# Patient Record
Sex: Female | Born: 1970 | Race: White | Hispanic: No | Smoking: Never smoker
Health system: Southern US, Community
[De-identification: ages and names within clinical notes are randomized; demographics above are authoritative.]

## PROBLEM LIST (undated history)

## (undated) DIAGNOSIS — Z87442 Personal history of urinary calculi: Secondary | ICD-10-CM

## (undated) DIAGNOSIS — I471 Supraventricular tachycardia, unspecified: Secondary | ICD-10-CM

## (undated) DIAGNOSIS — Z9889 Other specified postprocedural states: Secondary | ICD-10-CM

## (undated) DIAGNOSIS — B069 Rubella without complication: Secondary | ICD-10-CM

## (undated) DIAGNOSIS — J45909 Unspecified asthma, uncomplicated: Secondary | ICD-10-CM

## (undated) DIAGNOSIS — R112 Nausea with vomiting, unspecified: Secondary | ICD-10-CM

## (undated) HISTORY — PX: OTHER SURGICAL HISTORY: SHX169

## (undated) HISTORY — DX: Rubella without complication: B06.9

## (undated) HISTORY — PX: WISDOM TOOTH EXTRACTION: SHX21

## (undated) HISTORY — PX: TONSILLECTOMY: SUR1361

## (undated) HISTORY — PX: DIAGNOSTIC LAPAROSCOPY: SUR761

## (undated) HISTORY — PX: VAGINAL HYSTERECTOMY: SUR661

---

## 1999-02-21 ENCOUNTER — Other Ambulatory Visit: Admission: RE | Admit: 1999-02-21 | Discharge: 1999-02-21 | Payer: Self-pay | Admitting: Obstetrics and Gynecology

## 1999-03-12 ENCOUNTER — Encounter (INDEPENDENT_AMBULATORY_CARE_PROVIDER_SITE_OTHER): Payer: Self-pay

## 1999-03-13 ENCOUNTER — Inpatient Hospital Stay (HOSPITAL_COMMUNITY): Admission: EM | Admit: 1999-03-13 | Discharge: 1999-03-14 | Payer: Self-pay | Admitting: Obstetrics and Gynecology

## 1999-03-27 ENCOUNTER — Inpatient Hospital Stay (HOSPITAL_COMMUNITY): Admission: AD | Admit: 1999-03-27 | Discharge: 1999-03-27 | Payer: Self-pay | Admitting: Obstetrics & Gynecology

## 2002-02-01 ENCOUNTER — Encounter: Payer: Self-pay | Admitting: Family Medicine

## 2002-02-01 ENCOUNTER — Encounter: Admission: RE | Admit: 2002-02-01 | Discharge: 2002-02-01 | Payer: Self-pay | Admitting: Family Medicine

## 2002-05-03 ENCOUNTER — Encounter: Admission: RE | Admit: 2002-05-03 | Discharge: 2002-05-03 | Payer: Self-pay | Admitting: Family Medicine

## 2002-05-03 ENCOUNTER — Encounter: Payer: Self-pay | Admitting: Family Medicine

## 2002-05-18 ENCOUNTER — Encounter: Admission: RE | Admit: 2002-05-18 | Discharge: 2002-05-18 | Payer: Self-pay | Admitting: Family Medicine

## 2002-05-18 ENCOUNTER — Encounter: Payer: Self-pay | Admitting: Family Medicine

## 2002-06-10 ENCOUNTER — Encounter: Payer: Self-pay | Admitting: Family Medicine

## 2002-06-10 ENCOUNTER — Encounter: Admission: RE | Admit: 2002-06-10 | Discharge: 2002-06-10 | Payer: Self-pay | Admitting: Family Medicine

## 2003-02-04 ENCOUNTER — Encounter: Payer: Self-pay | Admitting: Obstetrics and Gynecology

## 2003-02-04 ENCOUNTER — Inpatient Hospital Stay (HOSPITAL_COMMUNITY): Admission: AD | Admit: 2003-02-04 | Discharge: 2003-02-04 | Payer: Self-pay | Admitting: Obstetrics and Gynecology

## 2003-09-15 ENCOUNTER — Other Ambulatory Visit: Admission: RE | Admit: 2003-09-15 | Discharge: 2003-09-15 | Payer: Self-pay | Admitting: Obstetrics and Gynecology

## 2004-03-14 ENCOUNTER — Ambulatory Visit: Payer: Self-pay | Admitting: Family Medicine

## 2004-03-19 ENCOUNTER — Ambulatory Visit: Payer: Self-pay | Admitting: Family Medicine

## 2004-04-13 ENCOUNTER — Emergency Department: Payer: Self-pay | Admitting: Emergency Medicine

## 2004-11-02 ENCOUNTER — Ambulatory Visit: Payer: Self-pay | Admitting: Family Medicine

## 2004-12-14 ENCOUNTER — Emergency Department: Payer: Self-pay | Admitting: Emergency Medicine

## 2004-12-14 ENCOUNTER — Other Ambulatory Visit: Payer: Self-pay

## 2005-05-20 ENCOUNTER — Other Ambulatory Visit: Admission: RE | Admit: 2005-05-20 | Discharge: 2005-05-20 | Payer: Self-pay | Admitting: Obstetrics and Gynecology

## 2005-09-21 ENCOUNTER — Inpatient Hospital Stay (HOSPITAL_COMMUNITY): Admission: AD | Admit: 2005-09-21 | Discharge: 2005-09-21 | Payer: Self-pay | Admitting: Obstetrics & Gynecology

## 2005-10-18 ENCOUNTER — Ambulatory Visit: Payer: Self-pay | Admitting: Family Medicine

## 2006-01-15 ENCOUNTER — Ambulatory Visit: Payer: Self-pay | Admitting: Family Medicine

## 2006-02-15 ENCOUNTER — Ambulatory Visit: Payer: Self-pay | Admitting: Orthopedic Surgery

## 2006-03-20 ENCOUNTER — Emergency Department: Payer: Self-pay | Admitting: Emergency Medicine

## 2006-04-19 ENCOUNTER — Emergency Department (HOSPITAL_COMMUNITY): Admission: EM | Admit: 2006-04-19 | Discharge: 2006-04-19 | Payer: Self-pay | Admitting: Emergency Medicine

## 2006-09-20 ENCOUNTER — Emergency Department: Payer: Self-pay | Admitting: Internal Medicine

## 2006-10-09 ENCOUNTER — Ambulatory Visit: Payer: Self-pay

## 2007-01-09 ENCOUNTER — Ambulatory Visit: Payer: Self-pay | Admitting: Family Medicine

## 2007-01-09 DIAGNOSIS — J019 Acute sinusitis, unspecified: Secondary | ICD-10-CM | POA: Insufficient documentation

## 2007-04-02 ENCOUNTER — Emergency Department: Payer: Self-pay | Admitting: Emergency Medicine

## 2007-04-02 ENCOUNTER — Other Ambulatory Visit: Payer: Self-pay

## 2007-05-21 ENCOUNTER — Ambulatory Visit: Payer: Self-pay | Admitting: Family Medicine

## 2007-05-21 DIAGNOSIS — F319 Bipolar disorder, unspecified: Secondary | ICD-10-CM | POA: Insufficient documentation

## 2007-09-07 ENCOUNTER — Ambulatory Visit: Payer: Self-pay | Admitting: Otolaryngology

## 2007-09-28 ENCOUNTER — Emergency Department: Payer: Self-pay | Admitting: Emergency Medicine

## 2007-09-29 ENCOUNTER — Emergency Department: Payer: Self-pay | Admitting: Emergency Medicine

## 2007-10-01 ENCOUNTER — Ambulatory Visit: Payer: Self-pay | Admitting: Emergency Medicine

## 2007-11-12 ENCOUNTER — Ambulatory Visit: Payer: Self-pay | Admitting: General Surgery

## 2008-02-29 ENCOUNTER — Emergency Department: Payer: Self-pay | Admitting: Emergency Medicine

## 2008-05-02 ENCOUNTER — Emergency Department: Payer: Self-pay | Admitting: Emergency Medicine

## 2008-09-02 ENCOUNTER — Emergency Department: Payer: Self-pay | Admitting: Emergency Medicine

## 2008-09-06 ENCOUNTER — Emergency Department: Payer: Self-pay | Admitting: Emergency Medicine

## 2008-10-06 ENCOUNTER — Other Ambulatory Visit: Payer: Self-pay | Admitting: Specialist

## 2008-11-11 ENCOUNTER — Other Ambulatory Visit: Payer: Self-pay

## 2009-02-06 ENCOUNTER — Ambulatory Visit: Payer: Self-pay | Admitting: Neurosurgery

## 2009-11-27 ENCOUNTER — Encounter (INDEPENDENT_AMBULATORY_CARE_PROVIDER_SITE_OTHER): Payer: Self-pay | Admitting: *Deleted

## 2010-03-28 ENCOUNTER — Ambulatory Visit (HOSPITAL_COMMUNITY)
Admission: RE | Admit: 2010-03-28 | Discharge: 2010-03-28 | Payer: Self-pay | Source: Home / Self Care | Attending: Obstetrics and Gynecology | Admitting: Obstetrics and Gynecology

## 2010-05-24 NOTE — Assessment & Plan Note (Signed)
Summary: SINUS INFECTION???/[facility]   Vital Signs:  Patient Profile:   40 Years Old Female Weight:      134 pounds Temp:     98.7 degrees F oral Pulse rate:   97 / minute BP sitting:   163 / 83  (left arm) Cuff size:   regular  Vitals Entered By: Cooper Render (January 09, 2007 4:16 PM)                 Chief Complaint:  uri sx's, took guaifenex, and no help.  Acute Visit History:      The patient complains of cough, earache, headache, sinus problems, and sore throat.  These symptoms began 2 weeks ago.  She denies fever.  Other comments include: using guafenesin.        The character of the cough is described as nonproductive.  There is no history of wheezing or shortness of breath associated with her cough.        The earache is located on the left side.        She complains of sinus pressure, ears being blocked, nasal congestion, and frontal headache.  She denies a previous history of sinusitis or previous sinus surgery.         Current Allergies (reviewed today): ! SULFA ! * LATEX     Review of Systems      See HPI   Physical Exam  General:     Well-developed,well-nourished,in no acute distress; alert,appropriate and cooperative throughout examination Head:     ttp b max sinuses Eyes:     No corneal or conjunctival inflammation noted. EOMI. Perrla. Vision grossly normal. Ears:     External ear exam shows no significant lesions or deformities.  Otoscopic examination reveals clear canals, tympanic membranes are intact bilaterally without bulging, retraction, inflammation or discharge. Hearing is grossly normal bilaterally. Nose:     nasal dischargemucosal pallor.   Mouth:     MMM Lungs:     Normal respiratory effort, chest expands symmetrically. Lungs are clear to auscultation, no crackles or wheezes. Heart:     Normal rate and regular rhythm. S1 and S2 normal without gallop, murmur, click, rub or other extra sounds.    Impression &  Recommendations:  Problem # 1:  SINUSITIS- ACUTE-NOS (ICD-461.9) Symptomatic care. Her updated medication list for this problem includes:    Amoxicillin 500 Mg Caps (Amoxicillin) .Marland Kitchen... 2 tab by mouth two times a day x 10 days   Complete Medication List: 1)  Amoxicillin 500 Mg Caps (Amoxicillin) .... 2 tab by mouth two times a day x 10 days   Patient Instructions: 1)  Mucinex daily, nasal saline three times a day     Prescriptions: AMOXICILLIN 500 MG  CAPS (AMOXICILLIN) 2 tab by mouth two times a day x 10 days  #40 x 0   Entered and Authorized by:   Kerby Nora MD   Signed by:   Kerby Nora MD on 01/09/2007   Method used:   Print then Give to Patient   RxID:   [id number]  ]

## 2010-05-24 NOTE — Letter (Signed)
Summary: Nadara Eaton letter  [place] at [facility]  [address] [place], [place] [address]   Phone: [phone]  Fax: [phone]       11/27/2009 MRN: [id number]  ADEENA BERNABE [address] [place], [place]  [address]  Dear Ms. [facility] and [facility] announce the retirement of Arta Silence, M.D., from full-time practice at the [facility] office effective October 19, 2009 and his plans of returning part-time.  It is important to Dr. Hetty Ely and to our practice that you understand that [facility] has seven physicians in our office for your health care needs.  We will continue to offer the same exceptional care that you have today.    Dr. Hetty Ely has spoken to many of you about his plans for retirement and returning part-time in the fall.   We will continue to work with you through the transition to schedule appointments for you in the office and meet the high standards that [facility] is committed to.   Again, it is with great pleasure that we share the news that Dr. Hetty Ely will return to [facility] in October of 2011 with a reduced schedule.    If you have any questions, or would like to request an appointment with one of our physicians, please call us at [phone] and press the option for Scheduling an appointment.  We take pleasure in providing you with excellent patient care and look forward to seeing you at your next office visit.  Our [facility] Physicians are:  Tillman Abide, M.D. Laurita Quint, M.D. Roxy Manns, M.D. Kerby Nora, M.D. Hannah Beat, M.D. Ruthe Mannan, M.D. We proudly welcomed Raechel Ache, M.D. and Eustaquio Boyden, M.D. to the practice in July/August 2011.  Sincerely,  [facility]

## 2010-06-01 ENCOUNTER — Emergency Department: Payer: Self-pay | Admitting: Emergency Medicine

## 2010-07-02 LAB — CBC
HCT: 44.3 % (ref 36.0–46.0)
Hemoglobin: 15 g/dL (ref 12.0–15.0)
MCH: 30.3 pg (ref 26.0–34.0)
MCHC: 33.8 g/dL (ref 30.0–36.0)
MCV: 89.7 fL (ref 78.0–100.0)
Platelets: 221 10*3/uL (ref 150–400)
RBC: 4.94 MIL/uL (ref 3.87–5.11)
RDW: 12.2 % (ref 11.5–15.5)
WBC: 12.2 10*3/uL — ABNORMAL HIGH (ref 4.0–10.5)

## 2010-09-07 NOTE — Consult Note (Signed)
[name], [name] NO.:  [id number]   MEDICAL RECORD NO.:  [id number]          PATIENT TYPE:  EMS   LOCATION:  [facility]                         FACILITY:  [facility]   PHYSICIAN:  Dionne Ano. Gramig III, M.D.DATE OF BIRTH:  Jul 29, 1970   DATE OF CONSULTATION:  DATE OF DISCHARGE:  04/19/2006                                 CONSULTATION   Tasha Miller was seen by the emergency room staff in the emergency  department.  She was brought to the emergency room due to pain after  surgical intervention for her upper extremity.  The patient underwent  ECRB debridement by myself about the right elbow Thursday afternoon.  She presents to the emergency room Saturday morning for pain.  She was  given 15 mcg of fentanyl x2 doses by the ER department.  I was called in  regards to her status and I came down to the emergency room to see her.   Tasha Miller is alert and oriented, she is with her friend.  She notes pain  with palpation over the area of surgical intervention.  I have gone  ahead and removed her splint and examined her.  I should note she is  neurovascularly in intact, there is no signs of compartment syndrome,  dystrophy or infection, skin is intact, compartments are soft, she has  excellent pulse, normal radial, median and ulnar nerve function, the  incision is clean, dry and intact.  I have reviewed this with Tasha Miller in  length; I have replaced her splint with careful alcohol wiped down to  the skin followed by placement of AAA antibiotic cream over the skin  followed by placement of gauze and a posterior plaster splint.  The  patient tolerated this well and there were no complicating features.  Following this, I discussed with her Dilaudid use 2 mg one p.o. q.4-6.h.  She can go up to two tablets of 2 mg every four to six hours as  necessary.  I have given her a prescription for Phenergan and in  addition to this, I gave her instructions to continue Robaxin for spasm.  The patient  will continue ice, elevation, finger range of motion, etc.  At the present time, she feels comfortable leaving the ER and monitoring  her condition at home.  I would concur of course.  I will plan to see  her back in the office in the next week or so and ask her to notify me  should any problems, questions or concerns arise.  All questions have  been encouraged, answered and addressed.           ______________________________  Dionne Ano. Everlene Other, M.D.     Tasha Miller  D:  04/19/2006  T:  04/20/2006  Job:  [id number]

## 2011-05-01 ENCOUNTER — Ambulatory Visit: Payer: Self-pay | Admitting: General Practice

## 2011-07-10 ENCOUNTER — Emergency Department: Payer: Self-pay | Admitting: Emergency Medicine

## 2011-07-10 LAB — CBC
HCT: 40.8 % (ref 35.0–47.0)
HGB: 13.8 g/dL (ref 12.0–16.0)
MCH: 29.6 pg (ref 26.0–34.0)
MCHC: 33.8 g/dL (ref 32.0–36.0)
MCV: 88 fL (ref 80–100)
Platelet: 205 10*3/uL (ref 150–440)
RBC: 4.66 10*6/uL (ref 3.80–5.20)
RDW: 12.6 % (ref 11.5–14.5)
WBC: 9.9 10*3/uL (ref 3.6–11.0)

## 2011-07-10 LAB — COMPREHENSIVE METABOLIC PANEL
Albumin: 4 g/dL (ref 3.4–5.0)
Alkaline Phosphatase: 61 U/L (ref 50–136)
Anion Gap: 12 (ref 7–16)
BUN: 10 mg/dL (ref 7–18)
Bilirubin,Total: 0.5 mg/dL (ref 0.2–1.0)
Calcium, Total: 8.7 mg/dL (ref 8.5–10.1)
Chloride: 108 mmol/L — ABNORMAL HIGH (ref 98–107)
Co2: 24 mmol/L (ref 21–32)
Creatinine: 0.87 mg/dL (ref 0.60–1.30)
EGFR (African American): >60
EGFR (Non-African Amer.): >60
Glucose: 142 mg/dL — ABNORMAL HIGH (ref 65–99)
Osmolality: 288 (ref 275–301)
Potassium: 3.6 mmol/L (ref 3.5–5.1)
SGOT(AST): 23 U/L (ref 15–37)
SGPT (ALT): 24 U/L
Sodium: 144 mmol/L (ref 136–145)
Total Protein: 7.4 g/dL (ref 6.4–8.2)

## 2011-07-10 LAB — URINALYSIS, COMPLETE
Bilirubin,UR: NEGATIVE
Glucose,UR: NEGATIVE mg/dL (ref 0–75)
Ketone: NEGATIVE
Leukocyte Esterase: NEGATIVE
Nitrite: NEGATIVE
Ph: 5 (ref 4.5–8.0)
Protein: NEGATIVE
RBC,UR: 2 /HPF (ref 0–5)
Specific Gravity: 1.016 (ref 1.003–1.030)
Squamous Epithelial: 7
WBC UR: 5 /HPF (ref 0–5)

## 2011-07-10 LAB — PREGNANCY, URINE: Pregnancy Test, Urine: NEGATIVE m[IU]/mL

## 2011-07-10 LAB — LIPASE, BLOOD: Lipase: 172 U/L (ref 73–393)

## 2011-12-03 ENCOUNTER — Ambulatory Visit: Payer: Self-pay

## 2012-03-23 ENCOUNTER — Other Ambulatory Visit: Payer: Self-pay | Admitting: Obstetrics and Gynecology

## 2012-08-20 ENCOUNTER — Ambulatory Visit: Payer: Self-pay | Admitting: General Practice

## 2012-12-15 ENCOUNTER — Ambulatory Visit: Payer: Self-pay | Admitting: General Practice

## 2013-01-20 ENCOUNTER — Other Ambulatory Visit: Payer: Self-pay | Admitting: Obstetrics and Gynecology

## 2013-07-13 ENCOUNTER — Ambulatory Visit (INDEPENDENT_AMBULATORY_CARE_PROVIDER_SITE_OTHER): Payer: No Typology Code available for payment source | Admitting: Internal Medicine

## 2013-07-13 ENCOUNTER — Encounter: Payer: Self-pay | Admitting: Internal Medicine

## 2013-07-13 VITALS — BP 100/64 | HR 72 | Temp 98.4°F | Ht 64.0 in | Wt 132.5 lb

## 2013-07-13 DIAGNOSIS — R5381 Other malaise: Secondary | ICD-10-CM

## 2013-07-13 DIAGNOSIS — Z0000 Encounter for general adult medical examination without abnormal findings: Secondary | ICD-10-CM

## 2013-07-13 DIAGNOSIS — E781 Pure hyperglyceridemia: Secondary | ICD-10-CM

## 2013-07-13 DIAGNOSIS — R5383 Other fatigue: Principal | ICD-10-CM

## 2013-07-13 LAB — LIPID PANEL
Cholesterol: 192 mg/dL (ref 0–200)
HDL: 45.2 mg/dL (ref >39.00)
LDL Cholesterol: 123 mg/dL — ABNORMAL HIGH (ref 0–99)
Total CHOL/HDL Ratio: 4
Triglycerides: 118 mg/dL (ref 0.0–149.0)
VLDL: 23.6 mg/dL (ref 0.0–40.0)

## 2013-07-13 LAB — CBC
HCT: 43.2 % (ref 36.0–46.0)
Hemoglobin: 14.3 g/dL (ref 12.0–15.0)
MCHC: 33.1 g/dL (ref 30.0–36.0)
MCV: 87.4 fl (ref 78.0–100.0)
Platelets: 219 10*3/uL (ref 150.0–400.0)
RBC: 4.95 Mil/uL (ref 3.87–5.11)
RDW: 12.9 % (ref 11.5–14.6)
WBC: 6.6 10*3/uL (ref 4.5–10.5)

## 2013-07-13 LAB — COMPREHENSIVE METABOLIC PANEL
ALT: 23 U/L (ref 0–35)
AST: 21 U/L (ref 0–37)
Albumin: 4.7 g/dL (ref 3.5–5.2)
Alkaline Phosphatase: 50 U/L (ref 39–117)
BUN: 15 mg/dL (ref 6–23)
CO2: 29 mEq/L (ref 19–32)
Calcium: 9.8 mg/dL (ref 8.4–10.5)
Chloride: 107 mEq/L (ref 96–112)
Creatinine, Ser: 0.9 mg/dL (ref 0.4–1.2)
GFR: 77.29 mL/min (ref >60.00)
Glucose, Bld: 100 mg/dL — ABNORMAL HIGH (ref 70–99)
Potassium: 4.8 mEq/L (ref 3.5–5.1)
Sodium: 142 mEq/L (ref 135–145)
Total Bilirubin: 0.7 mg/dL (ref 0.3–1.2)
Total Protein: 7.3 g/dL (ref 6.0–8.3)

## 2013-07-13 LAB — TSH: TSH: 1.4 u[IU]/mL (ref 0.35–5.50)

## 2013-07-13 LAB — VITAMIN B12: Vitamin B-12: 446 pg/mL (ref 211–911)

## 2013-07-13 NOTE — Progress Notes (Signed)
HPI Pt presents to the clinic today to establish care. She has not had a PCP in the last 4 years. She does have some concerns today.  1- She is fatigue all the time. She is not sure if her vitamin levels are low. She sleeps well at night. She would like her vitamin levels checked. 2- she has c/o intermittent pain in the left flank radiating to the left abdomen. She does have some associated nausea with this. She denies urinary symptoms. She has had kidney stones in the past.  Flu: 2014 LMP: hysterectomy Pap Smear: 2014 Eye Doctor: yearly Dentist: biannually   History reviewed. No pertinent past medical history.  Current Outpatient Prescriptions  Medication Sig Dispense Refill  . cholecalciferol (VITAMIN D) 1000 UNITS tablet Take 1,000 Units by mouth daily.      . Multiple Vitamin (MULTIVITAMIN) tablet Take 1 tablet by mouth 2 (two) times daily.      . NON FORMULARY Cell Enhancer, take by mouth two times a day      . Vitamin D, Ergocalciferol, (DRISDOL) 50000 UNITS CAPS capsule Take 50,000 Units by mouth every 7 (seven) days.       No current facility-administered medications for this visit.    Allergies  Allergen Reactions  . Latex     REACTION: rash  . Sulfonamide Derivatives     REACTION: projectile vomiting    Family History  Problem Relation Age of Onset  . Cancer Maternal Aunt     thyroid cancer  . Cancer Maternal Uncle   . Diabetes Paternal Aunt   . Heart disease Neg Hx   . Stroke Neg Hx     History   Social History  . Marital Status: Single    Spouse Name: N/A    Number of Children: N/A  . Years of Education: N/A   Occupational History  . Not on file.   Social History Main Topics  . Smoking status: Never Smoker   . Smokeless tobacco: Never Used  . Alcohol Use: No  . Drug Use: No  . Sexual Activity: Not on file   Other Topics Concern  . Not on file   Social History Narrative  . No narrative on file    ROS:  Constitutional: Pt reports fatigue.  Denies fever, malaise,  headache or abrupt weight changes.  HEENT: Denies eye pain, eye redness, ear pain, ringing in the ears, wax buildup, runny nose, nasal congestion, bloody nose, or sore throat. Respiratory: Denies difficulty breathing, shortness of breath, cough or sputum production.   Cardiovascular: Denies chest pain, chest tightness, palpitations or swelling in the hands or feet.  Gastrointestinal: Pt reports LLQ abdominal pain. Denies bloating, constipation, diarrhea or blood in the stool.  GU: pt reports left flank pain. Denies frequency, urgency, pain with urination, blood in urine, odor or discharge. Musculoskeletal: Denies decrease in range of motion, difficulty with gait, muscle pain or joint pain and swelling.  Skin: Denies redness, rashes, lesions or ulcercations.  Neurological: Denies dizziness, difficulty with memory, difficulty with speech or problems with balance and coordination.   No other specific complaints in a complete review of systems (except as listed in HPI above).  PE:  BP 100/64  Pulse 72  Temp(Src) 98.4 F (36.9 C) (Oral)  Ht 5\' 4"  (1.626 m)  Wt 132 lb 8 oz (60.102 kg)  BMI 22.73 kg/m2 Wt Readings from Last 3 Encounters:  07/13/13 132 lb 8 oz (60.102 kg)  05/21/07 135 lb (61.236 kg)  01/09/07  134 lb (60.782 kg)    General: Appears her stated age, well developed, well nourished in NAD. HEENT: Head: normal shape and size; Eyes: sclera white, no icterus, conjunctiva pink, PERRLA and EOMs intact; Ears: Tm's gray and intact, normal light reflex; Nose: mucosa pink and moist, septum midline; Throat/Mouth: Teeth present, mucosa pink and moist, no lesions or ulcerations noted.  Neck: Normal range of motion. Neck supple, trachea midline. No massses, lumps present. thyromegaly noted.  Cardiovascular: Normal rate and rhythm. S1,S2 noted.  No murmur, rubs or gallops noted. No JVD or BLE edema. No carotid bruits noted. Pulmonary/Chest: Normal effort and positive  vesicular breath sounds. No respiratory distress. No wheezes, rales or ronchi noted.  Abdomen: Soft and nontender. Normal bowel sounds, no bruits noted. No distention or masses noted. Liver, spleen and kidneys non palpable. No CVA tenderness noted. Musculoskeletal: Normal range of motion. No signs of joint swelling. No difficulty with gait.  Neurological: Alert and oriented. Cranial nerves II-XII intact. Coordination normal. +DTRs bilaterally. Psychiatric: Mood and affect normal. Behavior is normal. Judgment and thought content normal.   Assejscript:void(0)ssment and Plan:  Left flank pain and left lower abdominal pain:  She is asymptomatic at this time She is having normal BM If continue to be an issue, will consider CT scan of abdomen  Fatigue:  Will check CBC and B12 today  Prevent Health Maintenance:  Will check basic screening labs including lipid profile  RTC in 1 year or sooner if needed

## 2013-07-13 NOTE — Patient Instructions (Addendum)
Hypertriglyceridemia  Diet for High blood levels of Triglycerides Most fats in food are triglycerides. Triglycerides in your blood are stored as fat in your body. High levels of triglycerides in your blood may put you at a greater risk for heart disease and stroke.  Normal triglyceride levels are less than 150 mg/dL. Borderline high levels are 150-199 mg/dl. High levels are 200 - 499 mg/dL, and very high triglyceride levels are greater than 500 mg/dL. The decision to treat high triglycerides is generally based on the level. For people with borderline or high triglyceride levels, treatment includes weight loss and exercise. Drugs are recommended for people with very high triglyceride levels. Many people who need treatment for high triglyceride levels have metabolic syndrome. This syndrome is a collection of disorders that often include: insulin resistance, high blood pressure, blood clotting problems, high cholesterol and triglycerides. TESTING PROCEDURE FOR TRIGLYCERIDES  You should not eat 4 hours before getting your triglycerides measured. The normal range of triglycerides is between 10 and 250 milligrams per deciliter (mg/dl). Some people may have extreme levels (1000 or above), but your triglyceride level may be too high if it is above 150 mg/dl, depending on what other risk factors you have for heart disease.  People with high blood triglycerides may also have high blood cholesterol levels. If you have high blood cholesterol as well as high blood triglycerides, your risk for heart disease is probably greater than if you only had high triglycerides. High blood cholesterol is one of the main risk factors for heart disease. CHANGING YOUR DIET  Your weight can affect your blood triglyceride level. If you are more than 20% above your ideal body weight, you may be able to lower your blood triglycerides by losing weight. Eating less and exercising regularly is the best way to combat this. Fat provides more  calories than any other food. The best way to lose weight is to eat less fat. Only 30% of your total calories should come from fat. Less than 7% of your diet should come from saturated fat. A diet low in fat and saturated fat is the same as a diet to decrease blood cholesterol. By eating a diet lower in fat, you may lose weight, lower your blood cholesterol, and lower your blood triglyceride level.  Eating a diet low in fat, especially saturated fat, may also help you lower your blood triglyceride level. Ask your dietitian to help you figure how much fat you can eat based on the number of calories your caregiver has prescribed for you.  Exercise, in addition to helping with weight loss may also help lower triglyceride levels.   Alcohol can increase blood triglycerides. You may need to stop drinking alcoholic beverages.  Too much carbohydrate in your diet may also increase your blood triglycerides. Some complex carbohydrates are necessary in your diet. These may include bread, rice, potatoes, other starchy vegetables and cereals.  Reduce "simple" carbohydrates. These may include pure sugars, candy, honey, and jelly without losing other nutrients. If you have the kind of high blood triglycerides that is affected by the amount of carbohydrates in your diet, you will need to eat less sugar and less high-sugar foods. Your caregiver can help you with this.  Adding 2-4 grams of fish oil (EPA+ DHA) may also help lower triglycerides. Speak with your caregiver before adding any supplements to your regimen. Following the Diet  Maintain your ideal weight. Your caregivers can help you with a diet. Generally, eating less food and getting more   exercise will help you lose weight. Joining a weight control group may also help. Ask your caregivers for a good weight control group in your area.  Eat low-fat foods instead of high-fat foods. This can help you lose weight too.  These foods are lower in fat. Eat MORE of these:    Dried beans, peas, and lentils.  Egg whites.  Low-fat cottage cheese.  Fish.  Lean cuts of meat, such as round, sirloin, rump, and flank (cut extra fat off meat you fix).  Whole grain breads, cereals and pasta.  Skim and nonfat dry milk.  Low-fat yogurt.  Poultry without the skin.  Cheese made with skim or part-skim milk, such as mozzarella, parmesan, farmers', ricotta, or pot cheese. These are higher fat foods. Eat LESS of these:   Whole milk and foods made from whole milk, such as American, blue, cheddar, monterey jack, and swiss cheese  High-fat meats, such as luncheon meats, sausages, knockwurst, bratwurst, hot dogs, ribs, corned beef, ground pork, and regular ground beef.  Fried foods. Limit saturated fats in your diet. Substituting unsaturated fat for saturated fat may decrease your blood triglyceride level. You will need to read package labels to know which products contain saturated fats.  These foods are high in saturated fat. Eat LESS of these:   Fried pork skins.  Whole milk.  Skin and fat from poultry.  Palm oil.  Butter.  Shortening.  Cream cheese.  Bacon.  Margarines and baked goods made from listed oils.  Vegetable shortenings.  Chitterlings.  Fat from meats.  Coconut oil.  Palm kernel oil.  Lard.  Cream.  Sour cream.  Fatback.  Coffee whiteners and non-dairy creamers made with these oils.  Cheese made from whole milk. Use unsaturated fats (both polyunsaturated and monounsaturated) moderately. Remember, even though unsaturated fats are better than saturated fats; you still want a diet low in total fat.  These foods are high in unsaturated fat:   Canola oil.  Sunflower oil.  Mayonnaise.  Almonds.  Peanuts.  Pine nuts.  Margarines made with these oils.  Safflower oil.  Olive oil.  Avocados.  Cashews.  Peanut butter.  Sunflower seeds.  Soybean oil.  Peanut  oil.  Olives.  Pecans.  Walnuts.  Pumpkin seeds. Avoid sugar and other high-sugar foods. This will decrease carbohydrates without decreasing other nutrients. Sugar in your food goes rapidly to your blood. When there is excess sugar in your blood, your liver may use it to make more triglycerides. Sugar also contains calories without other important nutrients.  Eat LESS of these:   Sugar, brown sugar, powdered sugar, jam, jelly, preserves, honey, syrup, molasses, pies, candy, cakes, cookies, frosting, pastries, colas, soft drinks, punches, fruit drinks, and regular gelatin.  Avoid alcohol. Alcohol, even more than sugar, may increase blood triglycerides. In addition, alcohol is high in calories and low in nutrients. Ask for sparkling water, or a diet soft drink instead of an alcoholic beverage. Suggestions for planning and preparing meals   Bake, broil, grill or roast meats instead of frying.  Remove fat from meats and skin from poultry before cooking.  Add spices, herbs, lemon juice or vinegar to vegetables instead of salt, rich sauces or gravies.  Use a non-stick skillet without fat or use no-stick sprays.  Cool and refrigerate stews and broth. Then remove the hardened fat floating on the surface before serving.  Refrigerate meat drippings and skim off fat to make low-fat gravies.  Serve more fish.  Use less butter,   margarine and other high-fat spreads on bread or vegetables.  Use skim or reconstituted non-fat dry milk for cooking.  Cook with low-fat cheeses.  Substitute low-fat yogurt or cottage cheese for all or part of the sour cream in recipes for sauces, dips or congealed salads.  Use half yogurt/half mayonnaise in salad recipes.  Substitute evaporated skim milk for cream. Evaporated skim milk or reconstituted non-fat dry milk can be whipped and substituted for whipped cream in certain recipes.  Choose fresh fruits for dessert instead of high-fat foods such as pies or  cakes. Fruits are naturally low in fat. When Dining Out   Order low-fat appetizers such as fruit or vegetable juice, pasta with vegetables or tomato sauce.  Select clear, rather than cream soups.  Ask that dressings and gravies be served on the side. Then use less of them.  Order foods that are baked, broiled, poached, steamed, stir-fried, or roasted.  Ask for margarine instead of butter, and use only a small amount.  Drink sparkling water, unsweetened tea or coffee, or diet soft drinks instead of alcohol or other sweet beverages. QUESTIONS AND ANSWERS ABOUT OTHER FATS IN THE BLOOD: SATURATED FAT, TRANS FAT, AND CHOLESTEROL What is trans fat? Trans fat is a type of fat that is formed when vegetable oil is hardened through a process called hydrogenation. This process helps makes foods more solid, gives them shape, and prolongs their shelf life. Trans fats are also called hydrogenated or partially hydrogenated oils.  What do saturated fat, trans fat, and cholesterol in foods have to do with heart disease? Saturated fat, trans fat, and cholesterol in the diet all raise the level of LDL "bad" cholesterol in the blood. The higher the LDL cholesterol, the greater the risk for coronary heart disease (CHD). Saturated fat and trans fat raise LDL similarly.  What foods contain saturated fat, trans fat, and cholesterol? High amounts of saturated fat are found in animal products, such as fatty cuts of meat, chicken skin, and full-fat dairy products like butter, whole milk, cream, and cheese, and in tropical vegetable oils such as palm, palm kernel, and coconut oil. Trans fat is found in some of the same foods as saturated fat, such as vegetable shortening, some margarines (especially hard or stick margarine), crackers, cookies, baked goods, fried foods, salad dressings, and other processed foods made with partially hydrogenated vegetable oils. Small amounts of trans fat also occur naturally in some animal  products, such as milk products, beef, and lamb. Foods high in cholesterol include liver, other organ meats, egg yolks, shrimp, and full-fat dairy products. How can I use the new food label to make heart-healthy food choices? Check the Nutrition Facts panel of the food label. Choose foods lower in saturated fat, trans fat, and cholesterol. For saturated fat and cholesterol, you can also use the Percent Daily Value (%DV): 5% DV or less is low, and 20% DV or more is high. (There is no %DV for trans fat.) Use the Nutrition Facts panel to choose foods low in saturated fat and cholesterol, and if the trans fat is not listed, read the ingredients and limit products that list shortening or hydrogenated or partially hydrogenated vegetable oil, which tend to be high in trans fat. POINTS TO REMEMBER:   Discuss your risk for heart disease with your caregivers, and take steps to reduce risk factors.  Change your diet. Choose foods that are low in saturated fat, trans fat, and cholesterol.  Add exercise to your daily routine if   it is not already being done. Participate in physical activity of moderate intensity, like brisk walking, for at least 30 minutes on most, and preferably all days of the week. No time? Break the 30 minutes into three, 10-minute segments during the day.  Stop smoking. If you do smoke, contact your caregiver to discuss ways in which they can help you quit.  Do not use street drugs.  Maintain a normal weight.  Maintain a healthy blood pressure.  Keep up with your blood work for checking the fats in your blood as directed by your caregiver. Document Released: 01/25/2004 Document Revised: 10/08/2011 Document Reviewed: 08/22/2008 [employer] Patient Information 2014 [employer].  

## 2013-07-15 ENCOUNTER — Encounter: Payer: Self-pay | Admitting: Family Medicine

## 2013-07-16 ENCOUNTER — Other Ambulatory Visit: Payer: Self-pay

## 2013-07-27 ENCOUNTER — Ambulatory Visit: Payer: Self-pay

## 2014-01-31 ENCOUNTER — Ambulatory Visit: Payer: Self-pay | Admitting: General Practice

## 2014-01-31 ENCOUNTER — Other Ambulatory Visit: Payer: Self-pay | Admitting: Obstetrics and Gynecology

## 2014-02-01 LAB — CYTOLOGY - PAP

## 2014-02-03 ENCOUNTER — Other Ambulatory Visit: Payer: Self-pay | Admitting: Obstetrics and Gynecology

## 2014-02-03 DIAGNOSIS — N644 Mastodynia: Secondary | ICD-10-CM

## 2014-02-11 ENCOUNTER — Other Ambulatory Visit: Payer: No Typology Code available for payment source

## 2014-02-16 ENCOUNTER — Ambulatory Visit
Admission: RE | Admit: 2014-02-16 | Discharge: 2014-02-16 | Disposition: A | Discharge status: Home / Self Care | Payer: No Typology Code available for payment source | Source: Ambulatory Visit | Attending: Obstetrics and Gynecology | Admitting: Obstetrics and Gynecology

## 2014-02-16 DIAGNOSIS — N644 Mastodynia: Secondary | ICD-10-CM

## 2014-09-15 ENCOUNTER — Other Ambulatory Visit: Payer: Self-pay | Admitting: Family Medicine

## 2014-09-15 ENCOUNTER — Ambulatory Visit
Admission: RE | Admit: 2014-09-15 | Discharge: 2014-09-15 | Disposition: A | Discharge status: Home / Self Care | Payer: PRIVATE HEALTH INSURANCE | Source: Ambulatory Visit | Attending: Physician Assistant | Admitting: Physician Assistant

## 2014-09-15 ENCOUNTER — Other Ambulatory Visit: Payer: Self-pay | Admitting: Physician Assistant

## 2014-09-15 DIAGNOSIS — R05 Cough: Secondary | ICD-10-CM | POA: Diagnosis present

## 2014-09-15 DIAGNOSIS — R059 Cough, unspecified: Secondary | ICD-10-CM

## 2015-05-23 ENCOUNTER — Encounter (HOSPITAL_COMMUNITY): Payer: Self-pay

## 2015-05-23 NOTE — Anesthesia Preprocedure Evaluation (Addendum)
Anesthesia Evaluation  Patient identified by MRN, date of birth, ID band Patient awake    Reviewed: Allergy & Precautions, H&P , NPO status , Patient's Chart, lab work & pertinent test results  History of Anesthesia Complications (+) PONV and history of anesthetic complications  Airway Mallampati: I  TM Distance: >3 FB Neck ROM: full    Dental no notable dental hx.    Pulmonary neg pulmonary ROS,    Pulmonary exam normal breath sounds clear to auscultation       Cardiovascular negative cardio ROS Normal cardiovascular exam Rhythm:regular Rate:Normal     Neuro/Psych negative neurological ROS     GI/Hepatic negative GI ROS, Neg liver ROS,   Endo/Other  negative endocrine ROS  Renal/GU negative Renal ROS     Musculoskeletal   Abdominal   Peds  Hematology negative hematology ROS (+)   Anesthesia Other Findings   Reproductive/Obstetrics negative OB ROS                            Anesthesia Physical Anesthesia Plan  ASA: II  Anesthesia Plan: General   Post-op Pain Management:    Induction: Intravenous  Airway Management Planned: LMA  Additional Equipment:   Intra-op Plan:   Post-operative Plan: Extubation in OR  Informed Consent: I have reviewed the patients History and Physical, chart, labs and discussed the procedure including the risks, benefits and alternatives for the proposed anesthesia with the patient or authorized representative who has indicated his/her understanding and acceptance.   Dental Advisory Given  Plan Discussed with: Anesthesiologist, CRNA and Surgeon  Anesthesia Plan Comments: (GA with LMA  PONV: scop patch, decadron, zofran, IV hydration and propofol based anesthetic, limit volatile and N2O)       Anesthesia Quick Evaluation

## 2015-05-24 ENCOUNTER — Encounter (HOSPITAL_COMMUNITY)
Admission: RE | Disposition: A | Discharge status: Home / Self Care | Payer: Self-pay | Source: Ambulatory Visit | Attending: Obstetrics and Gynecology

## 2015-05-24 ENCOUNTER — Ambulatory Visit (HOSPITAL_COMMUNITY): Payer: Managed Care, Other (non HMO) | Admitting: Anesthesiology

## 2015-05-24 ENCOUNTER — Encounter (HOSPITAL_COMMUNITY): Payer: Self-pay

## 2015-05-24 ENCOUNTER — Ambulatory Visit (HOSPITAL_COMMUNITY)
Admission: RE | Admit: 2015-05-24 | Discharge: 2015-05-24 | Disposition: A | Discharge status: Home / Self Care | Payer: Managed Care, Other (non HMO) | Source: Ambulatory Visit | Attending: Obstetrics and Gynecology | Admitting: Obstetrics and Gynecology

## 2015-05-24 DIAGNOSIS — I471 Supraventricular tachycardia: Secondary | ICD-10-CM | POA: Insufficient documentation

## 2015-05-24 DIAGNOSIS — A630 Anogenital (venereal) warts: Secondary | ICD-10-CM | POA: Insufficient documentation

## 2015-05-24 HISTORY — PX: VULVAR LESION REMOVAL: SHX5391

## 2015-05-24 HISTORY — DX: Nausea with vomiting, unspecified: R11.2

## 2015-05-24 HISTORY — DX: Supraventricular tachycardia: I47.1

## 2015-05-24 HISTORY — DX: Supraventricular tachycardia, unspecified: I47.10

## 2015-05-24 HISTORY — DX: Other specified postprocedural states: Z98.890

## 2015-05-24 LAB — CBC
HCT: 39.6 % (ref 36.0–46.0)
Hemoglobin: 13.3 g/dL (ref 12.0–15.0)
MCH: 28.5 pg (ref 26.0–34.0)
MCHC: 33.6 g/dL (ref 30.0–36.0)
MCV: 84.8 fL (ref 78.0–100.0)
Platelets: 239 10*3/uL (ref 150–400)
RBC: 4.67 MIL/uL (ref 3.87–5.11)
RDW: 13.7 % (ref 11.5–15.5)
WBC: 10.4 10*3/uL (ref 4.0–10.5)

## 2015-05-24 SURGERY — VULVAR LESION
Anesthesia: General | Site: Vulva

## 2015-05-24 MED ORDER — FENTANYL CITRATE (PF) 250 MCG/5ML IJ SOLN
INTRAMUSCULAR | Status: AC
  Filled 2015-05-24: qty 5

## 2015-05-24 MED ORDER — KETOROLAC TROMETHAMINE 30 MG/ML IJ SOLN
INTRAMUSCULAR | Status: DC | PRN
  Administered 2015-05-24: 30 mg via INTRAVENOUS

## 2015-05-24 MED ORDER — PROPOFOL 10 MG/ML IV BOLUS
INTRAVENOUS | Status: AC
  Filled 2015-05-24: qty 20

## 2015-05-24 MED ORDER — IBUPROFEN 200 MG PO TABS: 600.0000 mg | ORAL_TABLET | Freq: Four times a day (QID) | ORAL | Status: DC | PRN

## 2015-05-24 MED ORDER — OXYCODONE-ACETAMINOPHEN 5-325 MG PO TABS
1.0000 | ORAL_TABLET | ORAL | Status: DC | PRN
  Administered 2015-05-24: 1 via ORAL

## 2015-05-24 MED ORDER — MIDAZOLAM HCL 2 MG/2ML IJ SOLN
INTRAMUSCULAR | Status: AC
  Filled 2015-05-24: qty 2

## 2015-05-24 MED ORDER — DEXAMETHASONE SODIUM PHOSPHATE 10 MG/ML IJ SOLN
INTRAMUSCULAR | Status: DC | PRN
  Administered 2015-05-24: 4 mg via INTRAVENOUS

## 2015-05-24 MED ORDER — IODINE STRONG (LUGOLS) 5 % PO SOLN
ORAL | Status: AC
  Filled 2015-05-24: qty 1

## 2015-05-24 MED ORDER — LIDOCAINE HCL 1 % IJ SOLN
INTRAMUSCULAR | Status: AC
  Filled 2015-05-24: qty 20

## 2015-05-24 MED ORDER — ACETIC ACID 5 % SOLN
Status: AC
  Filled 2015-05-24: qty 500

## 2015-05-24 MED ORDER — ACETIC ACID 5 % SOLN
Status: DC | PRN
  Administered 2015-05-24: 1 via TOPICAL

## 2015-05-24 MED ORDER — FENTANYL CITRATE (PF) 100 MCG/2ML IJ SOLN
INTRAMUSCULAR | Status: DC | PRN
  Administered 2015-05-24: 25 ug via INTRAVENOUS
  Administered 2015-05-24 (×4): 50 ug via INTRAVENOUS

## 2015-05-24 MED ORDER — SILVER SULFADIAZINE 1 % EX CREA
TOPICAL_CREAM | CUTANEOUS | Status: AC
  Filled 2015-05-24: qty 85

## 2015-05-24 MED ORDER — CEFAZOLIN SODIUM-DEXTROSE 2-3 GM-% IV SOLR
2.0000 g | INTRAVENOUS | Status: AC
  Administered 2015-05-24: 2 g via INTRAVENOUS

## 2015-05-24 MED ORDER — PROPOFOL 10 MG/ML IV BOLUS
INTRAVENOUS | Status: DC | PRN
  Administered 2015-05-24: 160 mg via INTRAVENOUS

## 2015-05-24 MED ORDER — LIDOCAINE HCL (CARDIAC) 20 MG/ML IV SOLN
INTRAVENOUS | Status: AC
  Filled 2015-05-24: qty 5

## 2015-05-24 MED ORDER — OXYCODONE-ACETAMINOPHEN 10-325 MG PO TABS: 1.0000 | ORAL_TABLET | ORAL | Status: DC | PRN

## 2015-05-24 MED ORDER — FLUMAZENIL 0.5 MG/5ML IV SOLN
INTRAVENOUS | Status: AC
  Filled 2015-05-24: qty 5

## 2015-05-24 MED ORDER — MIDAZOLAM HCL 2 MG/2ML IJ SOLN
INTRAMUSCULAR | Status: DC | PRN
  Administered 2015-05-24: 2 mg via INTRAVENOUS

## 2015-05-24 MED ORDER — ESTRADIOL 0.1 MG/GM VA CREA
TOPICAL_CREAM | VAGINAL | Status: AC
  Filled 2015-05-24: qty 42.5

## 2015-05-24 MED ORDER — KETOROLAC TROMETHAMINE 30 MG/ML IJ SOLN
INTRAMUSCULAR | Status: AC
  Filled 2015-05-24: qty 1

## 2015-05-24 MED ORDER — HYDROMORPHONE HCL 1 MG/ML IJ SOLN
0.2500 mg | INTRAMUSCULAR | Status: DC | PRN
  Administered 2015-05-24 (×2): 0.25 mg via INTRAVENOUS

## 2015-05-24 MED ORDER — DEXAMETHASONE SODIUM PHOSPHATE 4 MG/ML IJ SOLN
INTRAMUSCULAR | Status: AC
  Filled 2015-05-24: qty 1

## 2015-05-24 MED ORDER — CEFAZOLIN SODIUM-DEXTROSE 2-3 GM-% IV SOLR
INTRAVENOUS | Status: AC
  Filled 2015-05-24: qty 50

## 2015-05-24 MED ORDER — SCOPOLAMINE 1 MG/3DAYS TD PT72
MEDICATED_PATCH | TRANSDERMAL | Status: AC
  Filled 2015-05-24: qty 1

## 2015-05-24 MED ORDER — LIDOCAINE HCL (CARDIAC) 20 MG/ML IV SOLN
INTRAVENOUS | Status: DC | PRN
  Administered 2015-05-24: 70 mg via INTRAVENOUS
  Administered 2015-05-24: 30 mg via INTRAVENOUS

## 2015-05-24 MED ORDER — ONDANSETRON HCL 4 MG/2ML IJ SOLN
INTRAMUSCULAR | Status: AC
  Filled 2015-05-24: qty 2

## 2015-05-24 MED ORDER — OXYCODONE-ACETAMINOPHEN 5-325 MG PO TABS
ORAL_TABLET | ORAL | Status: AC
  Filled 2015-05-24: qty 1

## 2015-05-24 MED ORDER — SCOPOLAMINE 1 MG/3DAYS TD PT72
1.0000 | MEDICATED_PATCH | Freq: Once | TRANSDERMAL | Status: DC
  Administered 2015-05-24: 1.5 mg via TRANSDERMAL

## 2015-05-24 MED ORDER — HYDROMORPHONE HCL 1 MG/ML IJ SOLN
INTRAMUSCULAR | Status: AC
  Administered 2015-05-24: 0.25 mg via INTRAVENOUS
  Filled 2015-05-24: qty 1

## 2015-05-24 MED ORDER — LACTATED RINGERS IV SOLN
INTRAVENOUS | Status: DC
Start: 2015-05-24 — End: 2015-05-26
  Administered 2015-05-24: 07:00:00 via INTRAVENOUS

## 2015-05-24 MED ORDER — ONDANSETRON HCL 4 MG/2ML IJ SOLN
INTRAMUSCULAR | Status: DC | PRN
  Administered 2015-05-24: 4 mg via INTRAVENOUS

## 2015-05-24 MED ORDER — FLUMAZENIL 0.5 MG/5ML IV SOLN
INTRAVENOUS | Status: DC | PRN
  Administered 2015-05-24: 0.2 mg via INTRAVENOUS

## 2015-05-24 MED ORDER — OXYCODONE-ACETAMINOPHEN 5-325 MG PO TABS: 1.0000 | ORAL_TABLET | ORAL | Status: DC | PRN

## 2015-05-24 MED ORDER — LACTATED RINGERS IV SOLN
INTRAVENOUS | Status: DC
  Administered 2015-05-24: 125 mL/h via INTRAVENOUS

## 2015-05-24 SURGICAL SUPPLY — 26 items
BLADE SURG 15 STRL LF C SS BP (BLADE) ×1 IMPLANT
BLADE SURG 15 STRL SS (BLADE) ×1
CLOTH BEACON ORANGE TIMEOUT ST (SAFETY) ×2 IMPLANT
CONTAINER PREFILL 10% NBF 15ML (MISCELLANEOUS) ×2 IMPLANT
COUNTER NEEDLE 1200 MAGNETIC (NEEDLE) IMPLANT
ELECT REM PT RETURN 9FT ADLT (ELECTROSURGICAL) ×2
ELECTRODE REM PT RTRN 9FT ADLT (ELECTROSURGICAL) ×1 IMPLANT
GLOVE BIO SURGEON STRL SZ 6.5 (GLOVE) ×2 IMPLANT
GLOVE BIOGEL PI IND STRL 7.0 (GLOVE) ×1 IMPLANT
GLOVE BIOGEL PI INDICATOR 7.0 (GLOVE) ×1
GOWN STRL REUS W/TWL LRG LVL3 (GOWN DISPOSABLE) ×4 IMPLANT
HOSE NS SMOKE EVAC 7/8 X6 (MISCELLANEOUS) IMPLANT
NEEDLE HYPO 22GX1.5 SAFETY (NEEDLE) ×2 IMPLANT
NS IRRIG 1000ML POUR BTL (IV SOLUTION) ×2 IMPLANT
PACK VAGINAL MINOR WOMEN LF (CUSTOM PROCEDURE TRAY) ×2 IMPLANT
PAD OB MATERNITY 4.3X12.25 (PERSONAL CARE ITEMS) ×2 IMPLANT
PAD PREP 24X48 CUFFED NSTRL (MISCELLANEOUS) ×2 IMPLANT
PENCIL BUTTON HOLSTER BLD 10FT (ELECTRODE) IMPLANT
REDUCER FITTING SMOKE EVAC (MISCELLANEOUS) IMPLANT
SUT CHROMIC 4 0 SH 27 (SUTURE) IMPLANT
SUT VIC AB 2-0 SH 27 (SUTURE) ×2
SUT VIC AB 2-0 SH 27XBRD (SUTURE) ×2 IMPLANT
SUT VIC AB 3-0 SH 27 (SUTURE)
SUT VIC AB 3-0 SH 27X BRD (SUTURE) IMPLANT
TOWEL OR 17X24 6PK STRL BLUE (TOWEL DISPOSABLE) ×4 IMPLANT
WATER STERILE IRR 1000ML POUR (IV SOLUTION) IMPLANT

## 2015-05-24 NOTE — Transfer of Care (Signed)
Immediate Anesthesia Transfer of Care Note  Patient: Tasha Miller  Procedure(s) Performed: Procedure(s) with comments: resection of VULVAR condyloma with colposcope (N/A) - need colposcope  Patient Location: PACU  Anesthesia Type:General  Level of Consciousness: awake, oriented and patient cooperative  Airway & Oxygen Therapy: Patient Spontanous Breathing and Patient connected to nasal cannula oxygen  Post-op Assessment: Report given to RN and Post -op Vital signs reviewed and stable  Post vital signs: Reviewed and stable  Last Vitals:  Filed Vitals:   05/24/15 0632  BP: 121/80  Pulse: 70  Temp: 36.9 C  Resp: 16    Complications: No apparent anesthesia complications

## 2015-05-24 NOTE — Anesthesia Procedure Notes (Signed)
Procedure Name: LMA Insertion Date/Time: 05/24/2015 7:37 AM Performed by: Suella Grove Pre-anesthesia Checklist: Patient identified, Timeout performed, Emergency Drugs available, Suction available and Patient being monitored Patient Re-evaluated:Patient Re-evaluated prior to inductionOxygen Delivery Method: Circle system utilized and Simple face mask Preoxygenation: Pre-oxygenation with 100% oxygen Intubation Type: IV induction and Inhalational induction Ventilation: Mask ventilation without difficulty LMA Size: 4.0 Grade View: Grade II Tube type: Oral Number of attempts: 1 Placement Confirmation: positive ETCO2 and breath sounds checked- equal and bilateral Tube secured with: Tape Dental Injury: Teeth and Oropharynx as per pre-operative assessment

## 2015-05-24 NOTE — Op Note (Signed)
Tasha Miller, Tasha Miller                 ACCOUNT NO.:  [id number]  MEDICAL RECORD NO.:  [id number]  LOCATION:  [facility]                          FACILITY:  [facility]  PHYSICIAN:  Blythe Hartshorn L. Deshannon Hinchliffe, M.D.DATE OF BIRTH:  19-Dec-1970  DATE OF PROCEDURE:  05/24/2015 DATE OF DISCHARGE:                              OPERATIVE REPORT   PREOPERATIVE DIAGNOSIS:  Vaginal and vulvar condyloma.  POSTOPERATIVE DIAGNOSIS:  Vaginal and vulvar condyloma.  PROCEDURE:  Resection of vaginal and vulvar condyloma.  SURGEON:  Corwyn Vora L. Vincente Poli, M.D.  ANESTHESIA:  MAC.  EBL:  Minimal.  COMPLICATIONS:  None.  DRAINS:  None.  PATHOLOGY:  Vaginal tissue sent to pathology.  PROCEDURE IN DETAIL:  The patient was taken to the operating room. After consent was obtained.  She was then prepped and draped in usual sterile fashion and time-out was performed.  Using acetic acid, I then placed it on the vulva and vagina and did an extensive colposcopy in the vaginal and vulvar and perianal area.  She had some aceto-white area and just on the inner aspect of the introitus and on the other side the hymenal ring from 5-7 o'clock consistent with a previous biopsy performed in the office.  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  I made a V-shaped incision at the perineum and then used Metzenbaum scissors to resect the vaginal tissue from 5-7 o'clock in a small strip.  I could tell that I had removed the areas that were "bumpy," the tissue was sent to pathology.  I then closed that with 2 running stitches using 2-0 Vicryl. After that was performed, I could no longer feel any other bumpy areas in the vagina.  I then directed my attention to the perineum in the labia and using the Bovie cauterized all the small areas that appeared slightly raised, which were consistent with the areas that we  had inspected and noted to gather in the office.  There were scattered areas throughout.  There were no frond-like lesions on the perineum.  At the end of the procedure, Estrace cream was inserted to the vagina and Silvadene ointment was placed externally.  The patient will be given some postop instructions to follow up with me in 1 week.     Maddon Horton L. Vincente Poli, M.D.     Florestine Avers  D:  05/24/2015  T:  05/24/2015  Job:  [id number]

## 2015-05-24 NOTE — Brief Op Note (Signed)
05/24/2015  8:27 AM  PATIENT:  Tasha Miller  45 y.o. female  PRE-OPERATIVE DIAGNOSIS:   Vaginal and vulvar condyloma  POST-OPERATIVE DIAGNOSIS:   Same  PROCEDURE:  Extensive resection of vaginal and vulvar condyloma  SURGEON:  Surgeon(s) and Role:    * Marcelle Overlie, MD - Primary  PHYSICIAN ASSISTANT:   ASSISTANTS: none   ANESTHESIA:   MAC  EBL:   minimal  BLOOD ADMINISTERED:none  DRAINS: none   LOCAL MEDICATIONS USED:  NONE  SPECIMEN:  Source of Specimen:  vaginal condyloma  DISPOSITION OF SPECIMEN:  PATHOLOGY  COUNTS:  YES  TOURNIQUET:  * No tourniquets in log *  DICTATION: .Other Dictation: Dictation Number [id number]  PLAN OF CARE: Admit to inpatient   PATIENT DISPOSITION:  PACU - hemodynamically stable.   Delay start of Pharmacological VTE agent (>24hrs) due to surgical blood loss or risk of bleeding: not applicable

## 2015-05-24 NOTE — H&P (Signed)
  45 year old G 0 with vaginal and vulvar condyloma here for removal. No response to aldara, cryotherapy and 5-FU.  Past Medical History  Diagnosis Date  . PONV (postoperative nausea and vomiting)   . SVT (supraventricular tachycardia) [facility])    Past Surgical History  Procedure Laterality Date  . Vaginal hysterectomy    . Left wrist surgery    . Diagnostic laparoscopy    . Rotator cuff surgery    . Right elbow surgery    . Tonsillectomy    . Lymph node removal on right side of neck    . Wisdom tooth extraction     Prior to Admission medications   Medication Sig Start Date End Date Taking? Authorizing Provider  Cholecalciferol (VITAMIN D) 2000 units tablet Take 2,000 Units by mouth daily.   Yes Historical Provider, MD  Coconut Oil 1000 MG CAPS Take 2 capsules by mouth daily.   Yes Historical Provider, MD  flecainide (TAMBOCOR) 50 MG tablet Take 50 mg by mouth daily.   Yes Historical Provider, MD  Multiple Vitamin (MULTIVITAMIN) tablet Take 1 tablet by mouth 2 (two) times daily.   Yes Historical Provider, MD  traMADol (ULTRAM) 50 MG tablet Take 50 mg by mouth every 6 (six) hours as needed for moderate pain.   Yes Historical Provider, MD   Allergies: Sulfa  And latex  Family History  Problem Relation Age of Onset  . Cancer Maternal Aunt     thyroid cancer  . Cancer Maternal Uncle   . Diabetes Paternal Aunt   . Heart disease Neg Hx   . Stroke Neg Hx    ROS Negative  BP 121/80 mmHg  Pulse 70  Temp(Src) 98.4 F (36.9 C) (Oral)  Resp 16  Ht  (1.626 m)  Wt 140 lb (63.504 kg)  BMI 24.02 kg/m2  SpO2 98% Results for orders placed or performed during the hospital encounter of 05/24/15 (from the past 24 hour(s))  CBC     Status: None   Collection Time: 05/24/15  6:13 AM  Result Value Ref Range   WBC 10.4 4.0 - 10.5 K/uL   RBC 4.67 3.87 - 5.11 MIL/uL   Hemoglobin 13.3 12.0 - 15.0 g/dL   HCT 16.1 09.6 - 04.5 %   MCV 84.8 78.0 - 100.0 fL   MCH 28.5 26.0 - 34.0 pg   MCHC 33.6 30.0 - 36.0 g/dL   RDW 40.9 81.1 - 91.4 %   Platelets 239 150 - 400 K/uL   General alert and oriented Lung CTAB Car RRR Abdomen is soft and non tender Vulva and vagina multiple frond like areas - small in diameter Biopsy proven condyloma  IMPRESSION: Vaginal and vulvar condyloma  PLAN: Removal of condyloma Risks reviewed Consent signed

## 2015-05-24 NOTE — Anesthesia Postprocedure Evaluation (Signed)
Anesthesia Post Note  Patient: Tasha Miller  Procedure(s) Performed: Procedure(s) (LRB): resection of VULVAR condyloma with colposcope (N/A)  Patient location during evaluation: PACU Anesthesia Type: General Level of consciousness: awake and alert Pain management: pain level controlled Vital Signs Assessment: post-procedure vital signs reviewed and stable Respiratory status: spontaneous breathing, nonlabored ventilation, respiratory function stable and patient connected to nasal cannula oxygen Cardiovascular status: blood pressure returned to baseline and stable Postop Assessment: no signs of nausea or vomiting Anesthetic complications: no    Last Vitals:  Filed Vitals:   05/24/15 0845 05/24/15 0900  BP: 125/68 121/71  Pulse: 89 88  Temp:    Resp: 9 22    Last Pain:  Filed Vitals:   05/24/15 0908  PainSc: 6                  Reino Kent

## 2015-05-25 ENCOUNTER — Encounter (HOSPITAL_COMMUNITY): Payer: Self-pay | Admitting: Obstetrics and Gynecology

## 2015-07-06 ENCOUNTER — Ambulatory Visit: Payer: Self-pay | Admitting: Physician Assistant

## 2015-08-15 ENCOUNTER — Ambulatory Visit: Payer: Self-pay | Admitting: Physician Assistant

## 2015-08-15 ENCOUNTER — Encounter: Payer: Self-pay | Admitting: Physician Assistant

## 2015-08-15 VITALS — BP 110/70 | HR 86 | Temp 98.2°F

## 2015-08-15 DIAGNOSIS — A084 Viral intestinal infection, unspecified: Secondary | ICD-10-CM

## 2015-08-15 NOTE — Progress Notes (Signed)
S:  Pt c/o nausea and diarrhea, sx for 5 days, no fever/chills, no abd pain except for cramping with diarrhea; denies cp/sob, denies camping, bad food, recent antibiotics, or exposure to bad water, states its watery and explosive at times; Remainder ros neg, no otc meds  O:  Vitals wnl, nad, ENT wnl, neck supple no lymph, lungs c t a, cv rrr, abd soft nontender bs increased lower quads b/l, neuro intact  A:  Viral gastroenteritis  P:  Reassurance, fluids, brat diet, immodium ad for diarrhea if needed,  return if not better in 3 days, return earlier if worsening, brat diet

## 2015-10-03 ENCOUNTER — Ambulatory Visit: Payer: Self-pay | Admitting: Gastroenterology

## 2016-01-15 ENCOUNTER — Telehealth: Payer: Self-pay | Admitting: Gynecologic Oncology

## 2016-01-15 ENCOUNTER — Encounter: Payer: Self-pay | Admitting: Gynecologic Oncology

## 2016-01-15 ENCOUNTER — Ambulatory Visit: Payer: Managed Care, Other (non HMO) | Attending: Gynecologic Oncology | Admitting: Gynecologic Oncology

## 2016-01-15 DIAGNOSIS — A630 Anogenital (venereal) warts: Secondary | ICD-10-CM | POA: Insufficient documentation

## 2016-01-15 DIAGNOSIS — L299 Pruritus, unspecified: Secondary | ICD-10-CM

## 2016-01-15 DIAGNOSIS — Z79899 Other long term (current) drug therapy: Secondary | ICD-10-CM | POA: Insufficient documentation

## 2016-01-15 DIAGNOSIS — Z91040 Latex allergy status: Secondary | ICD-10-CM | POA: Insufficient documentation

## 2016-01-15 DIAGNOSIS — L292 Pruritus vulvae: Secondary | ICD-10-CM | POA: Insufficient documentation

## 2016-01-15 DIAGNOSIS — I471 Supraventricular tachycardia: Secondary | ICD-10-CM

## 2016-01-15 DIAGNOSIS — Z882 Allergy status to sulfonamides status: Secondary | ICD-10-CM | POA: Diagnosis not present

## 2016-01-15 MED ORDER — NORTRIPTYLINE HCL 10 MG PO CAPS: 10.0000 mg | ORAL_CAPSULE | Freq: Every day | ORAL | Status: DC

## 2016-01-15 NOTE — Telephone Encounter (Signed)
Spoke with patient about high alert interaction message when attempting to prescribe nortriptyline since she currently takes tambocor as well.  Patient advised that we would not be prescribing the medication and to call for any needs.

## 2016-01-15 NOTE — Progress Notes (Signed)
Consult Note: Gyn-Onc  Consult was requested by Dr. Vincente PoliGrewal for the evaluation of Tasha Miller 45 y.o. female  CC:  Chief Complaint  Patient presents with  . vulvar pruritis  . chronic condyloma accuminata    Assessment/Plan:  Tasha Miller  is a 45 y.o.  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   She has failed multiple therapies including laser, aldara, 5-FU and cryotherapy. She has sought consultation with dermatology.  I do not see evidence for occult malignancy.  I explained that a couple of potential therapies that could be considered are:  1/ CO2 laser of the labial lesions. The patient reports that her prior laser was vaginal. She feels that it helped the vaginal lesions, but that the labial lesions have not been yet treated this way. 2/Vergens (sinecatechins) which includes extracts from green tea and has proimmune properties. RCT's have shown 50+% response compared to 30+ response in placebo. 3/  intralesional injection of interferon. 4/ protopic (tacrolimus) topically applied.  I recommended a tricyclic antidepressant to assist with her symptoms of intractable itch. Unfortunately due to her SVT and flecainide treatment (which interacts with TCA's) she is unable to take this.  I have concerns that her underlying history is concerning for an immunosuppressive condition. She has had what sounds like a mycobacterial infection in her cervical nodes, multiple recurrences of rubella infection, and persistent HPV infection. I recommend referral to an immunologist to better assess and rule out an underlying problem with her immune system.   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   She has been treated Since 2011. Initially she underwent CO2 laser of the vulva, the patient reports smaller of  vaginal laser that she had and that these lesions that she has now a new and different from those that she had at that time. She reports having good symptom relief from that initial laser procedure. More recently she has had treatment with cryotherapy, 5-FU and Aldara. She does not have raised visible lesions without close inspection however her main symptom is of severe pruritus. She is unable to use antihistamine therapy due to significant side effects of somnolence.  The patient has been treated in. The for HSV with taking chronic Valtrex. Of note she is HSV antibody positive but has never noted vaginal ulcerations.  The patient has a medical history that is significant for a diagnosis of SVT for which she takes fleck denied. She also has a history of chronic cervical lymphadenopathy with illness, [facility] dissection with pathology revealing a bacterial infection within the lymph nodes. The patient also has a history of recurrent rubella infections including into adulthood. She [facility] and [facility] but does not feel that she is seen in immunologist in the past.  Current Meds:  Outpatient Encounter Prescriptions as of 01/15/2016  Medication Sig  . FLECAINIDE ACETATE PO Take by mouth.  . [DISCONTINUED] nortriptyline (PAMELOR) capsule 10 mg    No facility-administered encounter medications on file as of 01/15/2016.     Allergy:  Allergies  Allergen Reactions  . Sulfonamide Derivatives Nausea And Vomiting  . Latex Itching, Swelling and Rash    Social Hx:   Social History   Social History  . Marital status: Single    Spouse name:  N/A  . Number of children: N/A  . Years of education: N/A   Occupational History  . Not on file.   Social History Main Topics  . Smoking status: Never Smoker  . Smokeless tobacco: Never Used  . Alcohol use No  . Drug use: No  . Sexual activity: Not on file   Other Topics Concern  . Not  on file   Social History Narrative  . No narrative on file    Past Surgical Hx:  Past Surgical History:  Procedure Laterality Date  . DIAGNOSTIC LAPAROSCOPY    . left wrist surgery    . lymph node removal on right side of neck    . right elbow surgery    . rotator cuff surgery    . TONSILLECTOMY    . VAGINAL HYSTERECTOMY    . 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 WISDOM TOOTH EXTRACTION      Past Medical Hx:  Past Medical History:  Diagnosis Date  . PONV (postoperative nausea and vomiting)   . SVT (supraventricular tachycardia) ([facility])     Past Gynecological History:  G0, no histoyr of abnormal paps. No LMP recorded. Patient has had a hysterectomy. Performed for endometriosis (vaginal hysterectomy)  Family Hx:  Family History  Problem Relation Age of Onset  . Cancer Maternal Aunt     thyroid cancer  . Cancer Maternal Uncle   . Diabetes Paternal Aunt   . Heart disease Neg Hx   . Stroke Neg Hx     Review of Systems:  Constitutional  Feels well,    ENT Normal appearing ears and nares bilaterally Skin/Breast  No rash, sores, jaundice, itching, dryness Cardiovascular  No chest pain, shortness of breath, or edema  Pulmonary  No cough or wheeze.  Gastro Intestinal  No nausea, vomitting, or diarrhoea. No bright red blood per rectum, no abdominal pain, change in bowel movement, or constipation.  Genito Urinary  No frequency, urgency, dysuria, + vulvar pruritis Musculo Skeletal  No myalgia, arthralgia, joint swelling or pain  Neurologic  No weakness, numbness, change in gait,  Psychology  No depression, anxiety, insomnia.   Vitals:  Blood pressure 130/75, pulse 76, temperature 98.1 F (36.7 C), temperature source Oral, resp. rate 18, height 5\' 3"  (1.6 m), weight 146 lb 12.8 oz (66.6 kg), SpO2 100  %.  Physical Exam: WD in NAD Genito Urinary  Vulva/vagina: Normal external female genitalia. 5% acetic acid was applied. Cobblestoning was appreciated on the anterior labia minora and majora bilaterally (right more extesnive than left). I did not appreciated vaginal lesions with speculum exam and vaginal acetic acid application.  No discharge or bleeding.  Bladder/urethra:  No lesions or masses, well supported bladder  Vagina: see above  Cervix: surgically absent  Uterus: surgically absent   Adnexa: no palpable masses. Rectal  deferred Extremities  No bilateral cyanosis, clubbing or edema.   Quinn Axe, MD  01/15/2016, 6:06 PM

## 2016-01-15 NOTE — Patient Instructions (Signed)
Dr. Andrey Farmerossi will recommend laser of the vulva to Dr. Vincente PoliGrewal along with topical treatments for in the vagina.  We will place a referral for you to [facility].  She also has prescribed nortriptyline 10 mg once daily.  Please call our office for any questions or concerns.

## 2016-01-17 ENCOUNTER — Telehealth: Payer: Self-pay

## 2016-01-17 NOTE — Telephone Encounter (Signed)
[facility] to [facility] PH: [phone] for concerning immunosuppressive condition. Writer contact the [facility] requested we fax [phone] Dr Kara MeadEmma Rossi's documentation and they would contact the patient. Documents faxed per request , verification report received back "OK". Writer requested we be contacted when the patient has been scheduled for consultation. The patient is aware of the referral being placed.

## 2016-02-06 ENCOUNTER — Encounter: Payer: Self-pay | Admitting: Physician Assistant

## 2016-02-06 ENCOUNTER — Ambulatory Visit: Payer: Self-pay | Admitting: Physician Assistant

## 2016-02-06 VITALS — BP 124/74 | HR 101 | Temp 97.9°F

## 2016-02-06 DIAGNOSIS — J069 Acute upper respiratory infection, unspecified: Secondary | ICD-10-CM

## 2016-02-06 MED ORDER — IPRATROPIUM-ALBUTEROL 0.5-2.5 (3) MG/3ML IN SOLN: 3.0000 mL | RESPIRATORY_TRACT | 12 refills | Status: DC | PRN

## 2016-02-06 MED ORDER — FLUCONAZOLE 150 MG PO TABS: ORAL_TABLET | ORAL | 0 refills | Status: DC

## 2016-02-06 MED ORDER — METHYLPREDNISOLONE 4 MG PO TBPK: ORAL_TABLET | ORAL | 0 refills | Status: DC

## 2016-02-06 MED ORDER — ALBUTEROL SULFATE HFA 108 (90 BASE) MCG/ACT IN AERS
2.0000 | INHALATION_SPRAY | Freq: Four times a day (QID) | RESPIRATORY_TRACT | 12 refills | Status: DC | PRN
Start: 2016-02-06 — End: 2018-08-21

## 2016-02-06 MED ORDER — CEFDINIR 300 MG PO CAPS: 300.0000 mg | ORAL_CAPSULE | Freq: Two times a day (BID) | ORAL | 0 refills | Status: DC

## 2016-02-06 NOTE — Progress Notes (Signed)
S: C/o cough and congestion for 3 days, no fever, chills, cp/sob, v/d; mucus was green this am but clear throughout the day, cough is sporadic, lungs feel like they are on fire, wanted to catch it early as usually gets really sick once it progresses, used an outdated duoneb this am, needs another inhaler  Using otc meds: robitussin  O: PE: vitals wnl, nad, perrl eomi, normocephalic, tms dull, nasal mucosa red and swollen, throat injected, neck supple no lymph, lungs c t a, cv rrr, neuro intact  A:  Acute uri   P: drink fluids, continue regular meds , use otc meds of choice, return if not improving in 5 days, return earlier if worsening , omnicef, medrol dose pack, duoneb nebules, albuterol inhaler, diflucan

## 2016-02-28 ENCOUNTER — Encounter: Payer: Self-pay | Admitting: Physician Assistant

## 2016-02-28 ENCOUNTER — Ambulatory Visit: Payer: Self-pay | Admitting: Physician Assistant

## 2016-02-28 VITALS — BP 122/59 | HR 82 | Temp 97.9°F | Ht 64.0 in | Wt 149.0 lb

## 2016-02-28 DIAGNOSIS — G8929 Other chronic pain: Secondary | ICD-10-CM

## 2016-02-28 DIAGNOSIS — M79642 Pain in left hand: Secondary | ICD-10-CM

## 2016-02-28 DIAGNOSIS — Z299 Encounter for prophylactic measures, unspecified: Secondary | ICD-10-CM

## 2016-02-28 DIAGNOSIS — M5440 Lumbago with sciatica, unspecified side: Principal | ICD-10-CM

## 2016-02-28 DIAGNOSIS — J4520 Mild intermittent asthma, uncomplicated: Secondary | ICD-10-CM

## 2016-02-28 MED ORDER — TRAMADOL HCL 50 MG PO TABS: 50.0000 mg | ORAL_TABLET | Freq: Four times a day (QID) | ORAL | 3 refills | Status: DC | PRN

## 2016-02-28 MED ORDER — TRETINOIN 0.01 % EX GEL: Freq: Every day | CUTANEOUS | 3 refills | Status: DC

## 2016-02-28 MED ORDER — DICLOFENAC SODIUM 1 % TD GEL: 2.0000 g | Freq: Four times a day (QID) | TRANSDERMAL | 5 refills | Status: DC

## 2016-02-28 NOTE — Progress Notes (Addendum)
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, some small warts on her body, was given a rx that was really expensive and only had it filled once, denies headaches, cough, congestion, weight loss, breast changes, abdominal pain, changes in bowel habits, did not get a mammogram last year, fam hx maternal aunts/uncles thyroid cancer, paternal aunt DMII  O: vitals wnl, nad, ent wnl, neck supple no lymph, lungs c t a, cv rrr, abd soft nontender, lumbar spine a little tender, no loss of motion today, left hand with large callous and decreased rom of 3 and 4th fingers due to contracture, skin with small raised flat warts on forehead and back, n/v intact  A: chronic low back, chronic hand pain, flat warts, hx asthma  P: mammogram, annual labs, tramadol 50mg  #30 3 refills; voltaren gel for hand and back pain, retin A for flat warts  Vit d is low, sent in rx for vit d 50,000u q week

## 2016-02-29 LAB — CMP12+LP+TP+TSH+6AC+CBC/D/PLT
ALT: 16 IU/L (ref 0–32)
AST: 13 IU/L (ref 0–40)
Albumin/Globulin Ratio: 1.7 (ref 1.2–2.2)
Albumin: 4.5 g/dL (ref 3.5–5.5)
Alkaline Phosphatase: 63 IU/L (ref 39–117)
BUN/Creatinine Ratio: 13 (ref 9–23)
BUN: 10 mg/dL (ref 6–24)
Basophils Absolute: 0.1 10*3/uL (ref 0.0–0.2)
Basos: 1 %
Bilirubin Total: 0.3 mg/dL (ref 0.0–1.2)
Calcium: 9.5 mg/dL (ref 8.7–10.2)
Chloride: 104 mmol/L (ref 96–106)
Chol/HDL Ratio: 4.4 ratio units (ref 0.0–4.4)
Cholesterol, Total: 209 mg/dL — ABNORMAL HIGH (ref 100–199)
Creatinine, Ser: 0.8 mg/dL (ref 0.57–1.00)
EOS (ABSOLUTE): 0.3 10*3/uL (ref 0.0–0.4)
Eos: 3 %
Estimated CHD Risk: 1.1 times avg. — ABNORMAL HIGH (ref 0.0–1.0)
Free Thyroxine Index: 2 (ref 1.2–4.9)
GFR calc Af Amer: 102 mL/min/{1.73_m2} (ref >59)
GFR calc non Af Amer: 89 mL/min/{1.73_m2} (ref >59)
GGT: 14 IU/L (ref 0–60)
Globulin, Total: 2.7 g/dL (ref 1.5–4.5)
Glucose: 106 mg/dL — ABNORMAL HIGH (ref 65–99)
HDL: 47 mg/dL (ref >39)
Hematocrit: 42.2 % (ref 34.0–46.6)
Hemoglobin: 14.4 g/dL (ref 11.1–15.9)
Immature Grans (Abs): 0 10*3/uL (ref 0.0–0.1)
Immature Granulocytes: 0 %
Iron: 93 ug/dL (ref 27–159)
LDH: 183 IU/L (ref 119–226)
LDL Calculated: 134 mg/dL — ABNORMAL HIGH (ref 0–99)
Lymphocytes Absolute: 3.6 10*3/uL — ABNORMAL HIGH (ref 0.7–3.1)
Lymphs: 37 %
MCH: 28.9 pg (ref 26.6–33.0)
MCHC: 34.1 g/dL (ref 31.5–35.7)
MCV: 85 fL (ref 79–97)
Monocytes Absolute: 0.5 10*3/uL (ref 0.1–0.9)
Monocytes: 5 %
Neutrophils Absolute: 5.5 10*3/uL (ref 1.4–7.0)
Neutrophils: 54 %
Phosphorus: 3.4 mg/dL (ref 2.5–4.5)
Platelets: 252 10*3/uL (ref 150–379)
Potassium: 4.5 mmol/L (ref 3.5–5.2)
RBC: 4.99 x10E6/uL (ref 3.77–5.28)
RDW: 13.2 % (ref 12.3–15.4)
Sodium: 140 mmol/L (ref 134–144)
T3 Uptake Ratio: 28 % (ref 24–39)
T4, Total: 7.1 ug/dL (ref 4.5–12.0)
TSH: 1.89 u[IU]/mL (ref 0.450–4.500)
Total Protein: 7.2 g/dL (ref 6.0–8.5)
Triglycerides: 142 mg/dL (ref 0–149)
Uric Acid: 5.9 mg/dL (ref 2.5–7.1)
VLDL Cholesterol Cal: 28 mg/dL (ref 5–40)
WBC: 9.9 10*3/uL (ref 3.4–10.8)

## 2016-02-29 LAB — VITAMIN D 25 HYDROXY (VIT D DEFICIENCY, FRACTURES): Vit D, 25-Hydroxy: 19.3 ng/mL — ABNORMAL LOW (ref 30.0–100.0)

## 2016-02-29 LAB — B12 AND FOLATE PANEL
Folate: 13.2 ng/mL (ref >3.0)
Vitamin B-12: 490 pg/mL (ref 211–946)

## 2016-02-29 MED ORDER — VITAMIN D (ERGOCALCIFEROL) 1.25 MG (50000 UNIT) PO CAPS: 50000.0000 [IU] | ORAL_CAPSULE | ORAL | 3 refills | Status: DC

## 2016-02-29 NOTE — Addendum Note (Signed)
Addended by: Faythe GheeFISHER, SUSAN W on: 02/29/2016 09:35 AM   Modules accepted: Orders

## 2016-03-08 LAB — SPECIMEN STATUS REPORT

## 2016-03-08 LAB — HGB A1C W/O EAG: Hgb A1c MFr Bld: 5.4 % (ref 4.8–5.6)

## 2016-04-02 ENCOUNTER — Ambulatory Visit: Payer: PRIVATE HEALTH INSURANCE | Attending: Physician Assistant

## 2016-05-07 ENCOUNTER — Ambulatory Visit: Payer: Self-pay | Admitting: Physician Assistant

## 2016-05-07 ENCOUNTER — Encounter: Payer: Self-pay | Admitting: Physician Assistant

## 2016-05-07 VITALS — BP 132/70 | HR 105 | Temp 98.1°F

## 2016-05-07 DIAGNOSIS — J069 Acute upper respiratory infection, unspecified: Secondary | ICD-10-CM

## 2016-05-07 LAB — POCT INFLUENZA A/B
Influenza A, POC: NEGATIVE
Influenza B, POC: NEGATIVE

## 2016-05-07 MED ORDER — METHYLPREDNISOLONE 4 MG PO TBPK: ORAL_TABLET | ORAL | 0 refills | Status: DC

## 2016-05-07 MED ORDER — CEFDINIR 300 MG PO CAPS: 300.0000 mg | ORAL_CAPSULE | Freq: Two times a day (BID) | ORAL | 0 refills | Status: DC

## 2016-05-07 MED ORDER — HYDROCOD POLST-CPM POLST ER 10-8 MG/5ML PO SUER: 5.0000 mL | Freq: Two times a day (BID) | ORAL | 0 refills | Status: DC | PRN

## 2016-05-07 NOTE — Progress Notes (Signed)
S: C/o cough and congestion with wheezing and chest pain, chest is sore from coughing, denies fever, chills; states cough is dry and hacking; keeping pt awake at night;  denies cardiac type chest pain or sob, v/d, abd pain, just did a breathing treatment prior to arrival Remainder ros neg  O: vitals wnl, nad, tms clear, throat injected, neck supple no lymph, lungs c t a, cv rrr, neuro intact  A:  Acute bronchitis   P:  rx medication:  Omnicef, medrol dose pack, tussionex nr;  use otc meds, tylenol or motrin as needed for fever/chills, return if not better in 3 -5 days, return earlier if worsening

## 2016-05-14 ENCOUNTER — Ambulatory Visit
Admission: RE | Admit: 2016-05-14 | Discharge: 2016-05-14 | Disposition: A | Discharge status: Home / Self Care | Payer: Managed Care, Other (non HMO) | Source: Ambulatory Visit | Attending: Physician Assistant | Admitting: Physician Assistant

## 2016-05-14 DIAGNOSIS — Z1231 Encounter for screening mammogram for malignant neoplasm of breast: Secondary | ICD-10-CM | POA: Insufficient documentation

## 2016-05-17 ENCOUNTER — Encounter: Payer: Self-pay | Admitting: Physician Assistant

## 2016-05-17 ENCOUNTER — Ambulatory Visit: Payer: Self-pay | Admitting: Physician Assistant

## 2016-05-17 ENCOUNTER — Emergency Department: Payer: Managed Care, Other (non HMO)

## 2016-05-17 ENCOUNTER — Inpatient Hospital Stay
Admission: EM | Admit: 2016-05-17 | Discharge: 2016-05-20 | DRG: 871 | Disposition: A | Discharge status: Home / Self Care | Payer: Managed Care, Other (non HMO) | Attending: Internal Medicine | Admitting: Internal Medicine

## 2016-05-17 VITALS — BP 140/90 | HR 116

## 2016-05-17 DIAGNOSIS — A419 Sepsis, unspecified organism: Secondary | ICD-10-CM | POA: Diagnosis present

## 2016-05-17 DIAGNOSIS — J9601 Acute respiratory failure with hypoxia: Secondary | ICD-10-CM | POA: Diagnosis present

## 2016-05-17 DIAGNOSIS — Z79899 Other long term (current) drug therapy: Secondary | ICD-10-CM | POA: Diagnosis not present

## 2016-05-17 DIAGNOSIS — Z91048 Other nonmedicinal substance allergy status: Secondary | ICD-10-CM

## 2016-05-17 DIAGNOSIS — Z882 Allergy status to sulfonamides status: Secondary | ICD-10-CM | POA: Diagnosis not present

## 2016-05-17 DIAGNOSIS — R0602 Shortness of breath: Secondary | ICD-10-CM

## 2016-05-17 DIAGNOSIS — J45901 Unspecified asthma with (acute) exacerbation: Secondary | ICD-10-CM | POA: Diagnosis present

## 2016-05-17 DIAGNOSIS — Z8679 Personal history of other diseases of the circulatory system: Secondary | ICD-10-CM

## 2016-05-17 DIAGNOSIS — R0902 Hypoxemia: Secondary | ICD-10-CM

## 2016-05-17 DIAGNOSIS — J101 Influenza due to other identified influenza virus with other respiratory manifestations: Secondary | ICD-10-CM | POA: Diagnosis present

## 2016-05-17 DIAGNOSIS — R000 Tachycardia, unspecified: Secondary | ICD-10-CM | POA: Diagnosis present

## 2016-05-17 LAB — CBC WITH DIFFERENTIAL/PLATELET
Basophils Absolute: 0.1 10*3/uL (ref 0–0.1)
Basophils Relative: 1 %
Eosinophils Absolute: 0.2 10*3/uL (ref 0–0.7)
Eosinophils Relative: 2 %
HCT: 40.8 % (ref 35.0–47.0)
Hemoglobin: 14.3 g/dL (ref 12.0–16.0)
Lymphocytes Relative: 18 %
Lymphs Abs: 1.9 10*3/uL (ref 1.0–3.6)
MCH: 29.4 pg (ref 26.0–34.0)
MCHC: 35.1 g/dL (ref 32.0–36.0)
MCV: 83.9 fL (ref 80.0–100.0)
Monocytes Absolute: 1.4 10*3/uL — ABNORMAL HIGH (ref 0.2–0.9)
Monocytes Relative: 13 %
Neutro Abs: 6.9 10*3/uL — ABNORMAL HIGH (ref 1.4–6.5)
Neutrophils Relative %: 66 %
Platelets: 236 10*3/uL (ref 150–440)
RBC: 4.86 MIL/uL (ref 3.80–5.20)
RDW: 13.8 % (ref 11.5–14.5)
WBC: 10.5 10*3/uL (ref 3.6–11.0)

## 2016-05-17 LAB — COMPREHENSIVE METABOLIC PANEL
ALT: 26 U/L (ref 14–54)
AST: 20 U/L (ref 15–41)
Albumin: 4.3 g/dL (ref 3.5–5.0)
Alkaline Phosphatase: 73 U/L (ref 38–126)
Anion gap: 9 (ref 5–15)
BUN: 13 mg/dL (ref 6–20)
CO2: 26 mmol/L (ref 22–32)
Calcium: 8.7 mg/dL — ABNORMAL LOW (ref 8.9–10.3)
Chloride: 104 mmol/L (ref 101–111)
Creatinine, Ser: 1.02 mg/dL — ABNORMAL HIGH (ref 0.44–1.00)
GFR calc Af Amer: >60 mL/min (ref >60)
GFR calc non Af Amer: >60 mL/min (ref >60)
Glucose, Bld: 88 mg/dL (ref 65–99)
Potassium: 3.5 mmol/L (ref 3.5–5.1)
Sodium: 139 mmol/L (ref 135–145)
Total Bilirubin: 0.6 mg/dL (ref 0.3–1.2)
Total Protein: 7.6 g/dL (ref 6.5–8.1)

## 2016-05-17 LAB — TROPONIN I: Troponin I: <0.03 ng/mL (ref <0.03)

## 2016-05-17 LAB — INFLUENZA PANEL BY PCR (TYPE A & B)
Influenza A By PCR: POSITIVE — AB
Influenza B By PCR: NEGATIVE

## 2016-05-17 MED ORDER — ALBUTEROL SULFATE HFA 108 (90 BASE) MCG/ACT IN AERS: 2.0000 | INHALATION_SPRAY | Freq: Four times a day (QID) | RESPIRATORY_TRACT | Status: DC | PRN

## 2016-05-17 MED ORDER — ALBUTEROL SULFATE (2.5 MG/3ML) 0.083% IN NEBU
2.5000 mg | INHALATION_SOLUTION | Freq: Four times a day (QID) | RESPIRATORY_TRACT | Status: DC
  Administered 2016-05-17 – 2016-05-20 (×11): 2.5 mg via RESPIRATORY_TRACT
  Filled 2016-05-17 (×11): qty 3

## 2016-05-17 MED ORDER — IPRATROPIUM-ALBUTEROL 0.5-2.5 (3) MG/3ML IN SOLN
3.0000 mL | Freq: Once | RESPIRATORY_TRACT | Status: AC
  Administered 2016-05-17: 3 mL via RESPIRATORY_TRACT
  Filled 2016-05-17: qty 3

## 2016-05-17 MED ORDER — KETOROLAC TROMETHAMINE 30 MG/ML IJ SOLN
INTRAMUSCULAR | Status: AC
  Administered 2016-05-17: 30 mg via INTRAVENOUS
  Filled 2016-05-17: qty 1

## 2016-05-17 MED ORDER — MAGNESIUM SULFATE 2 GM/50ML IV SOLN
2.0000 g | Freq: Once | INTRAVENOUS | Status: AC
  Administered 2016-05-17: 2 g via INTRAVENOUS
  Filled 2016-05-17: qty 50

## 2016-05-17 MED ORDER — SODIUM CHLORIDE 0.9 % IV SOLN
INTRAVENOUS | Status: DC
  Administered 2016-05-18 – 2016-05-19 (×3): via INTRAVENOUS

## 2016-05-17 MED ORDER — ACETAMINOPHEN 500 MG PO TABS
ORAL_TABLET | ORAL | Status: AC
  Administered 2016-05-17: 1000 mg via ORAL
  Filled 2016-05-17: qty 2

## 2016-05-17 MED ORDER — BISACODYL 10 MG RE SUPP: 10.0000 mg | Freq: Every day | RECTAL | Status: DC | PRN

## 2016-05-17 MED ORDER — SODIUM CHLORIDE 0.9 % IV SOLN
Freq: Once | INTRAVENOUS | Status: AC
  Administered 2016-05-17: 13:00:00 via INTRAVENOUS

## 2016-05-17 MED ORDER — OSELTAMIVIR PHOSPHATE 75 MG PO CAPS
75.0000 mg | ORAL_CAPSULE | Freq: Two times a day (BID) | ORAL | Status: DC
  Administered 2016-05-17 – 2016-05-20 (×6): 75 mg via ORAL
  Filled 2016-05-17 (×6): qty 1

## 2016-05-17 MED ORDER — TRAMADOL HCL 50 MG PO TABS: 50.0000 mg | ORAL_TABLET | Freq: Four times a day (QID) | ORAL | Status: DC | PRN

## 2016-05-17 MED ORDER — METHYLPREDNISOLONE SODIUM SUCC 125 MG IJ SOLR
60.0000 mg | INTRAMUSCULAR | Status: DC
  Administered 2016-05-18 – 2016-05-20 (×3): 60 mg via INTRAVENOUS
  Filled 2016-05-17 (×3): qty 2

## 2016-05-17 MED ORDER — METHYLPREDNISOLONE SODIUM SUCC 125 MG IJ SOLR
125.0000 mg | Freq: Once | INTRAMUSCULAR | Status: AC
  Administered 2016-05-17: 125 mg via INTRAVENOUS
  Filled 2016-05-17: qty 2

## 2016-05-17 MED ORDER — IPRATROPIUM-ALBUTEROL 0.5-2.5 (3) MG/3ML IN SOLN
3.0000 mL | RESPIRATORY_TRACT | Status: DC | PRN
  Administered 2016-05-17: 3 mL via RESPIRATORY_TRACT
  Filled 2016-05-17: qty 3

## 2016-05-17 MED ORDER — ONDANSETRON HCL 4 MG/2ML IJ SOLN: 4.0000 mg | Freq: Four times a day (QID) | INTRAMUSCULAR | Status: DC | PRN

## 2016-05-17 MED ORDER — ACETAMINOPHEN 650 MG RE SUPP: 650.0000 mg | Freq: Four times a day (QID) | RECTAL | Status: DC | PRN

## 2016-05-17 MED ORDER — DOCUSATE SODIUM 100 MG PO CAPS
100.0000 mg | ORAL_CAPSULE | Freq: Two times a day (BID) | ORAL | Status: DC
  Administered 2016-05-17 – 2016-05-19 (×3): 100 mg via ORAL
  Filled 2016-05-17 (×5): qty 1

## 2016-05-17 MED ORDER — KETOROLAC TROMETHAMINE 30 MG/ML IJ SOLN
30.0000 mg | Freq: Once | INTRAMUSCULAR | Status: AC
  Administered 2016-05-17: 30 mg via INTRAVENOUS

## 2016-05-17 MED ORDER — POLYETHYLENE GLYCOL 3350 17 G PO PACK: 17.0000 g | PACK | Freq: Every day | ORAL | Status: DC | PRN

## 2016-05-17 MED ORDER — HYDROCOD POLST-CPM POLST ER 10-8 MG/5ML PO SUER
5.0000 mL | Freq: Two times a day (BID) | ORAL | Status: DC | PRN
  Administered 2016-05-17 – 2016-05-18 (×2): 5 mL via ORAL
  Filled 2016-05-17 (×2): qty 5

## 2016-05-17 MED ORDER — ACETAMINOPHEN 500 MG PO TABS
1000.0000 mg | ORAL_TABLET | Freq: Once | ORAL | Status: AC
  Administered 2016-05-17: 1000 mg via ORAL

## 2016-05-17 MED ORDER — FLECAINIDE ACETATE 50 MG PO TABS
50.0000 mg | ORAL_TABLET | Freq: Every day | ORAL | Status: DC
  Administered 2016-05-17 – 2016-05-19 (×3): 50 mg via ORAL
  Filled 2016-05-17 (×3): qty 1

## 2016-05-17 MED ORDER — ONDANSETRON HCL 4 MG PO TABS: 4.0000 mg | ORAL_TABLET | Freq: Four times a day (QID) | ORAL | Status: DC | PRN

## 2016-05-17 MED ORDER — ACETAMINOPHEN 325 MG PO TABS
650.0000 mg | ORAL_TABLET | Freq: Four times a day (QID) | ORAL | Status: DC | PRN
  Administered 2016-05-17: 650 mg via ORAL
  Filled 2016-05-17 (×2): qty 2

## 2016-05-17 MED ORDER — OSELTAMIVIR PHOSPHATE 75 MG PO CAPS
75.0000 mg | ORAL_CAPSULE | Freq: Once | ORAL | Status: AC
  Administered 2016-05-17: 75 mg via ORAL
  Filled 2016-05-17: qty 1

## 2016-05-17 MED ORDER — IBUPROFEN 400 MG PO TABS
600.0000 mg | ORAL_TABLET | Freq: Four times a day (QID) | ORAL | Status: DC | PRN
  Administered 2016-05-18: 600 mg via ORAL
  Filled 2016-05-17: qty 2

## 2016-05-17 MED ORDER — ENOXAPARIN SODIUM 40 MG/0.4ML ~~LOC~~ SOLN
40.0000 mg | SUBCUTANEOUS | Status: DC
  Administered 2016-05-17 – 2016-05-19 (×3): 40 mg via SUBCUTANEOUS
  Filled 2016-05-17 (×3): qty 0.4

## 2016-05-17 MED ORDER — IPRATROPIUM-ALBUTEROL 0.5-2.5 (3) MG/3ML IN SOLN
3.0000 mL | Freq: Once | RESPIRATORY_TRACT | Status: AC
Start: 2016-05-17 — End: 2016-05-17
  Administered 2016-05-17: 3 mL via RESPIRATORY_TRACT
  Filled 2016-05-17: qty 3

## 2016-05-17 MED ORDER — TRAMADOL HCL 50 MG PO TABS
50.0000 mg | ORAL_TABLET | Freq: Four times a day (QID) | ORAL | Status: DC | PRN
Start: 2016-05-17 — End: 2016-05-20
  Administered 2016-05-17 – 2016-05-19 (×3): 50 mg via ORAL
  Filled 2016-05-17 (×3): qty 1

## 2016-05-17 MED ORDER — SODIUM CHLORIDE 0.9 % IV BOLUS (SEPSIS)
1000.0000 mL | Freq: Once | INTRAVENOUS | Status: AC
  Administered 2016-05-17: 1000 mL via INTRAVENOUS

## 2016-05-17 MED ORDER — COCONUT OIL 1000 MG PO CAPS: 2.0000 | ORAL_CAPSULE | Freq: Every day | ORAL | Status: DC

## 2016-05-17 NOTE — ED Notes (Signed)
Patient up to bedside commode at this time

## 2016-05-17 NOTE — Progress Notes (Signed)
S: c/o continued sob, nausea, "something isn't right", bodyaches, taking omnicef as rx'd, feels weak  O: vitals w elevated HR at 116, lungs with diffuse rhonchi, cv tachycardia reg rhythm; pt can't sit up straight, can't walk with weaving, can't sit up long enough to take breathing treatment  A: ?pneumonia  P: sent pt to ER via her EMS partner, assisted pt to ambulance, called ER to notify them pt is on the way

## 2016-05-17 NOTE — H&P (Signed)
[facility] - [place] at [facility]   PATIENT NAME: Tasha Miller    MR#:  [id number]  DATE OF BIRTH:  05/11/1970  DATE OF ADMISSION:  05/17/2016  PRIMARY CARE PHYSICIAN: No PCP Per Patient   REQUESTING/REFERRING PHYSICIAN: Dr Thomasene MohairPAduhowski  CHIEF COMPLAINT:   Increasing shortness of breath, fever, congestion and not feeling well since yesterday HISTORY OF PRESENT ILLNESS:  Tasha Miller  is a 46 y.o. female with a known history of Allergic asthma comes to the emergency room after she started having increasing shortness of breath fever or congestion not feeling well. Patient was tested positive for flu. She was wheezing received some Solu-Medrol and breathing treatment. Lobe better. She is tachycardic and anxious. We'll admit her for acute respiratory failure in the setting of influenza A.  PAST MEDICAL HISTORY:   Past Medical History:  Diagnosis Date  . PONV (postoperative nausea and vomiting)   . Rubella   . SVT (supraventricular tachycardia) (HCC)     PAST SURGICAL HISTOIRY:   Past Surgical History:  Procedure Laterality Date  . DIAGNOSTIC LAPAROSCOPY    . left wrist surgery    . lymph node removal on right side of neck    . right elbow surgery    . rotator cuff surgery    . TONSILLECTOMY    . VAGINAL HYSTERECTOMY    . VULVAR LESION REMOVAL N/A 05/24/2015   Procedure: resection of VULVAR condyloma with colposcope;  Surgeon: Marcelle OverlieMichelle Grewal, MD;  Location: [facility];  Service: Gynecology;  Laterality: N/A;  need colposcope  . WISDOM TOOTH EXTRACTION      SOCIAL HISTORY:   Social History  Substance Use Topics  . Smoking status: Never Smoker  . Smokeless tobacco: Never Used  . Alcohol use No    FAMILY HISTORY:   Family History  Problem Relation Age of Onset  . Cancer Maternal Aunt     thyroid cancer  . Cancer Maternal Uncle   . Diabetes Paternal Aunt   . Heart disease Neg Hx   . Stroke Neg Hx   . Breast cancer Neg Hx     DRUG ALLERGIES:    Allergies  Allergen Reactions  . Sulfonamide Derivatives Nausea And Vomiting  . Latex Itching and Swelling    REVIEW OF SYSTEMS:  Review of Systems  Constitutional: Positive for chills, fever and malaise/fatigue. Negative for weight loss.  HENT: Negative for ear discharge, ear pain and nosebleeds.   Eyes: Negative for blurred vision, pain and discharge.  Respiratory: Positive for shortness of breath. Negative for sputum production, wheezing and stridor.   Cardiovascular: Negative for chest pain, palpitations, orthopnea and PND.  Gastrointestinal: Negative for abdominal pain, diarrhea, nausea and vomiting.  Genitourinary: Negative for frequency and urgency.  Musculoskeletal: Negative for back pain and joint pain.  Neurological: Positive for weakness. Negative for sensory change, speech change and focal weakness.  Psychiatric/Behavioral: Negative for depression and hallucinations. The patient is not nervous/anxious.      MEDICATIONS AT HOME:   Prior to Admission medications   Medication Sig Start Date End Date Taking? Authorizing Provider  albuterol (PROVENTIL HFA;VENTOLIN HFA) 108 (90 Base) MCG/ACT inhaler Inhale 2 puffs into the lungs every 6 (six) hours as needed for wheezing or shortness of breath. 02/06/16  Yes Faythe GheeSusan W Fisher, PA-C  cefdinir (OMNICEF) 300 MG capsule Take 1 capsule (300 mg total) by mouth 2 (two) times daily. 05/07/16  Yes Faythe GheeSusan W Fisher, PA-C  chlorpheniramine-HYDROcodone (TUSSIONEX PENNKINETIC ER) 10-8 MG/5ML Kenton KingfisherSUER  Take 5 mLs by mouth every 12 (twelve) hours as needed for cough. 05/07/16  Yes Faythe Ghee, PA-C  Coconut Oil 1000 MG CAPS Take 2 capsules by mouth daily.   Yes Historical Provider, MD  flecainide (TAMBOCOR) 50 MG tablet Take 50 mg by mouth daily.   Yes Historical Provider, MD  ibuprofen (MOTRIN IB) 200 MG tablet Take 3 tablets (600 mg total) by mouth every 6 (six) hours as needed. 05/24/15  Yes Marcelle Overlie, MD  ipratropium-albuterol (DUONEB)  0.5-2.5 (3) MG/3ML SOLN Take 3 mLs by nebulization every 4 (four) hours as needed. 02/06/16  Yes Faythe Ghee, PA-C  traMADol (ULTRAM) 50 MG tablet Take 1 tablet (50 mg total) by mouth every 6 (six) hours as needed for moderate pain. 02/28/16  Yes Faythe Ghee, PA-C  Vitamin D, Ergocalciferol, (DRISDOL) 50000 units CAPS capsule Take 1 capsule (50,000 Units total) by mouth every 7 (seven) days. 02/29/16   Faythe Ghee, PA-C      VITAL SIGNS:  Blood pressure (!) 111/52, pulse (!) 104, temperature 98.7 F (37.1 C), temperature source Oral, resp. rate (!) 24, height 5\' 3"  (1.6 m), weight 67.1 kg (148 lb), SpO2 98 %.  PHYSICAL EXAMINATION:  GENERAL:  46 y.o.-year-old patient lying in the bed with no acute distress.  EYES: Pupils equal, round, reactive to light and accommodation. No scleral icterus. Extraocular muscles intact.  HEENT: Head atraumatic, normocephalic. Oropharynx and nasopharynx clear.  NECK:  Supple, no jugular venous distention. No thyroid enlargement, no tenderness.  LUNGS: Normal breath sounds bilaterally, no wheezing, rales,rhonchi or crepitation. No use of accessory muscles of respiration.  CARDIOVASCULAR: S1, S2 normal. No murmurs, rubs, or gallops. Tachycardic ABDOMEN: Soft, nontender, nondistended. Bowel sounds present. No organomegaly or mass.  EXTREMITIES: No pedal edema, cyanosis, or clubbing.  NEUROLOGIC: Cranial nerves II through XII are intact. Muscle strength 5/5 in all extremities. Sensation intact. Gait not checked.  PSYCHIATRIC: The patient is alert and oriented x 3.  SKIN: No obvious rash, lesion, or ulcer.   LABORATORY PANEL:   CBC  Recent Labs Lab 05/17/16 0858  WBC 10.5  HGB 14.3  HCT 40.8  PLT 236   ------------------------------------------------------------------------------------------------------------------  Chemistries   Recent Labs Lab 05/17/16 0858  NA 139  K 3.5  CL 104  CO2 26  GLUCOSE 88  BUN 13  CREATININE 1.02*  CALCIUM  8.7*  AST 20  ALT 26  ALKPHOS 73  BILITOT 0.6   ------------------------------------------------------------------------------------------------------------------  Cardiac Enzymes  Recent Labs Lab 05/17/16 0858  TROPONINI <0.03   ------------------------------------------------------------------------------------------------------------------  RADIOLOGY:  Dg Chest 2 View  Result Date: 05/17/2016 CLINICAL DATA:  Fever, shortness of breath, cough for 3 weeks, history of asthma EXAM: CHEST  2 VIEW COMPARISON:  09/15/2014 FINDINGS: Cardiomediastinal silhouette is stable. No infiltrate or pulmonary edema. There are mild infrahilar and basilar bronchitic changes. Bony thorax is stable. IMPRESSION: No infiltrate or pulmonary edema. Mild infrahilar and basilar bronchitic changes. Electronically Signed   By: Natasha Mead M.D.   On: 05/17/2016 09:51    EKG:  Sinus tachycardia  IMPRESSION AND PLAN:   Anasofia Micallef  is a 46 y.o. female with a known history of Allergic asthma comes to the emergency room after she started having increasing shortness of breath fever or congestion not feeling well.  1.Acute respiratory failure secondary to asthma flare in the setting of influenza a -Admitted to medical floor -IV fluids -Tamiflu 75 mg twice a day -When necessary Tylenol -Nebulizer oxygen and  oral inhalers -Wean oxygen as tolerated  2. Tachycardia due to #1  3. DVT prophylaxis subcutaneous Lovenox     All the records are reviewed and case discussed with ED provider. Management plans discussed with the patient, family and they are in agreement.  CODE STATUS:  full  TOTAL TIME TAKING CARE OF THIS PATIENT:  50 minutes.    Wilhelmina Hark M.D on 05/17/2016 at 1:07 PM  Between 7am to 6pm - Pager - [phone]  After 6pm go to [email or web address] - password [email or web address] [facility]  [facility]  Office  [phone]  CC: Primary care physician; No PCP Per Patient

## 2016-05-17 NOTE — ED Provider Notes (Signed)
[facility] Emergency Department Provider Note  Time seen: 8:51 AM  I have reviewed the triage vital signs and the nursing notes.   HISTORY  Chief Complaint Shortness of Breath and Respiratory Distress    HPI Tasha Miller is a 46 y.o. female presents to the emergency department for 3 weeks of cough, congestion. According to the patient for the past 3 weeks she has been coughing and feeling congested. Does not know if she had a fever during the past 3 weeks. Patient states a history of allergy-induced asthma but states it is well controlled. Denies COPD. Patient states today her shortness of breath was significantly worse so she came to the emergency department for evaluation. Denies any chest pain. Denies nausea, vomiting or diarrhea.  Past Medical History:  Diagnosis Date  . PONV (postoperative nausea and vomiting)   . Rubella   . SVT (supraventricular tachycardia) [facility])     Patient Active Problem List   Diagnosis Date Noted  . 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 Pruritus 01/15/2016  . SVT (supraventricular tachycardia) (HCC) 01/15/2016  . Hypertriglyceridemia 07/13/2013  . BIPOLAR AFFECTIVE DISORDER 05/21/2007    Past Surgical History:  Procedure Laterality Date  . DIAGNOSTIC LAPAROSCOPY    . left wrist surgery    . lymph node removal on right side of neck    . right elbow surgery    . rotator cuff surgery    . TONSILLECTOMY    . VAGINAL HYSTERECTOMY    . 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 WISDOM TOOTH EXTRACTION      Prior to Admission medications   Medication Sig Start Date End Date Taking? Authorizing Provider  albuterol (PROVENTIL HFA;VENTOLIN HFA) 108 (90 Base) MCG/ACT inhaler Inhale 2 puffs into the lungs every 6 (six) hours as needed for wheezing or shortness  of breath. 02/06/16   Faythe Ghee, PA-C  cefdinir (OMNICEF) 300 MG capsule Take 1 capsule (300 mg total) by mouth 2 (two) times daily. 05/07/16   Faythe Ghee, PA-C  chlorpheniramine-HYDROcodone (TUSSIONEX PENNKINETIC ER) 10-8 MG/5ML SUER Take 5 mLs by mouth every 12 (twelve) hours as needed for cough. 05/07/16   Faythe Ghee, PA-C  Cholecalciferol (VITAMIN D) 2000 units tablet Take 2,000 Units by mouth daily.    Historical Provider, MD  Coconut Oil 1000 MG CAPS Take 2 capsules by mouth daily.    Historical Provider, MD  diclofenac sodium (VOLTAREN) 1 % GEL Apply 2 g topically 4 (four) times daily. 02/28/16   Faythe Ghee, PA-C  flecainide (TAMBOCOR) 50 MG tablet Take 50 mg by mouth daily.    Historical Provider, MD  FLECAINIDE ACETATE PO Take by mouth.    Historical Provider, MD  ibuprofen (MOTRIN IB) 200 MG tablet Take 3 tablets (600 mg total) by mouth every 6 (six) hours as needed. 05/24/15   Marcelle Overlie, MD  ipratropium-albuterol (DUONEB) 0.5-2.5 (3) MG/3ML SOLN Take 3 mLs by nebulization every 4 (four) hours as needed. 02/06/16   Faythe Ghee, PA-C  methylPREDNISolone (MEDROL DOSEPAK) 4 MG TBPK tablet 2 pills today then 1 pill a day for 4 days Patient not taking: Reported on 05/17/2016 05/07/16   Faythe Ghee, PA-C  traMADol (ULTRAM) 50 MG tablet Take 1 tablet (50 mg total) by mouth every 6 (six) hours as needed  for moderate pain. 02/28/16   Faythe Ghee, PA-C  tretinoin (RETIN-A) 0.01 % gel Apply topically at bedtime. 02/28/16   Faythe Ghee, PA-C  Vitamin D, Ergocalciferol, (DRISDOL) 50000 units CAPS capsule Take 1 capsule (50,000 Units total) by mouth every 7 (seven) days. 02/29/16   Faythe Ghee, PA-C    Allergies  Allergen Reactions  . Sulfonamide Derivatives Nausea And Vomiting  . Latex Itching, Swelling and Rash    Family History  Problem Relation Age of Onset  . Cancer Maternal Aunt     thyroid cancer  . Cancer Maternal Uncle   . Diabetes Paternal Aunt   . Heart  disease Neg Hx   . Stroke Neg Hx   . Breast cancer Neg Hx     Social History Social History  Substance Use Topics  . Smoking status: Never Smoker  . Smokeless tobacco: Never Used  . Alcohol use No    Review of Systems Constitutional: Unknown fever. Positive for congestion. Cardiovascular: Negative for chest pain. Respiratory: Positive for shortness of breath, cough. Gastrointestinal: Negative for abdominal pain, vomiting and diarrhea Neurological: Negative for headache 10-point ROS otherwise negative.  ____________________________________________   PHYSICAL EXAM:  VITAL SIGNS: ED Triage Vitals [05/17/16 0849]  Enc Vitals Group     BP      Pulse Rate (!) 1     Resp      Temp (!) 100.8 F (38.2 C)     Temp src      SpO2      Weight 148 lb (67.1 kg)     Height 5\' 3"  (1.6 m)     Head Circumference      Peak Flow      Pain Score      Pain Loc      Pain Edu?      Excl. in GC?     Constitutional: Alert and oriented. Sitting upright in bed coughing, mild distress due to difficulty breathing. Eyes: Normal exam ENT   Head: Normocephalic and atraumatic.   Mouth/Throat: Mucous membranes are moist. Cardiovascular: Normal rate, regular rhythm. No murmur Respiratory: Moderate tachypnea with diffuse moderate expiratory wheezes in all lung fields. Gastrointestinal: Soft and nontender. No distention. Musculoskeletal: Nontender with normal range of motion in all extremities. Neurologic:  Normal speech and language. No gross focal neurologic deficits Skin:  Skin is warm, dry and intact.  Psychiatric: Mood and affect are normal.   ____________________________________________    EKG  EKG reviewed and interpreted by myself shows sinus tachycardia at 102 bpm. Narrow QRS, normal axis, normal intervals, no concerning ST changes.  ____________________________________________    RADIOLOGY  CXR negative  ____________________________________________   INITIAL  IMPRESSION / ASSESSMENT AND PLAN / ED COURSE  Pertinent labs & imaging results that were available during my care of the patient were reviewed by me and considered in my medical decision making (see chart for details).  Patient presents to the emergency department with difficulty breathing. States 3 weeks of symptoms acutely worse over the past 24 hours. Patient has diffuse expiratory wheeze in all lung fields. We will check labs, chest x-ray, influenza swab, and treat with DuoNeb's, magnesium, Solu-Medrol and closely monitor in the emergency department. Patient states during the course of her illness she did go through a Medrol dose pack as well as a prescription of Omnicef. She has completed both of these prescriptions.  Patient continues to have diffuse expiratory wheeze and rhonchi. Patient's labs are positive for influenza A. Normal  white blood cell count. Patient remains tachycardic from 120 bpm. Chest x-ray is negative for infiltrate. Patient desaturates to 86% with a good waveform on room air. Hold an oxygen saturation between 92-95 percent on 2 L. Given the patient's hypoxia with influenza diagnosis patient will be admitted to the hospital for further treatment. Tamiflu has been started.  ____________________________________________   FINAL CLINICAL IMPRESSION(S) / ED DIAGNOSES  Dyspnea Cough Influenza Hypoxia   Minna AntisKevin Cornelia Walraven, MD 05/17/16 1042

## 2016-05-17 NOTE — ED Notes (Signed)
All triage information entered by this RN  but a different employee had logged me out without my knowledge

## 2016-05-17 NOTE — ED Triage Notes (Signed)
Pt arrives with reports of increased shortness of breath  Respiratory distress upon arrival

## 2016-05-18 MED ORDER — HYDROCOD POLST-CPM POLST ER 10-8 MG/5ML PO SUER
5.0000 mL | Freq: Two times a day (BID) | ORAL | Status: DC
  Administered 2016-05-18 – 2016-05-20 (×4): 5 mL via ORAL
  Filled 2016-05-18 (×4): qty 5

## 2016-05-18 NOTE — Progress Notes (Signed)
[facility] - [place] at [facility]   PATIENT NAME: Tasha Miller    MR#:  [id number]  DATE OF BIRTH:  01/03/1971  SUBJECTIVE:  CHIEF COMPLAINT:   Chief Complaint  Patient presents with  . Shortness of Breath  . Respiratory Distress   - still has cough, fevers and dyspnea. -Flu test is positive  REVIEW OF SYSTEMS:  Review of Systems  Constitutional: Positive for fever and malaise/fatigue. Negative for chills.  HENT: Negative for congestion, ear discharge, hearing loss and nosebleeds.   Eyes: Negative for blurred vision and double vision.  Respiratory: Positive for cough, shortness of breath and wheezing.   Cardiovascular: Negative for chest pain, palpitations and leg swelling.  Gastrointestinal: Negative for abdominal pain, constipation, diarrhea, nausea and vomiting.  Genitourinary: Negative for dysuria.  Neurological: Negative for dizziness, speech change, focal weakness, seizures and headaches.  Psychiatric/Behavioral: Negative for depression.    DRUG ALLERGIES:   Allergies  Allergen Reactions  . Sulfonamide Derivatives Nausea And Vomiting  . Latex Itching and Swelling    VITALS:  Blood pressure 121/61, pulse 86, temperature 98 F (36.7 C), temperature source Oral, resp. rate 20, height 5\' 3"  (1.6 m), weight 67.1 kg (148 lb), SpO2 96 %.  PHYSICAL EXAMINATION:  Physical Exam  GENERAL:  46 y.o.-year-old patient lying in the bed with no acute distress.  EYES: Pupils equal, round, reactive to light and accommodation. No scleral icterus. Extraocular muscles intact.  HEENT: Head atraumatic, normocephalic. Oropharynx and nasopharynx clear.  NECK:  Supple, no jugular venous distention. No thyroid enlargement, no tenderness.  LUNGS:Scattered expiratory wheezing bilaterally, but good air movement, no rales,rhonchi or crepitation. No use of accessory muscles of respiration.  CARDIOVASCULAR: S1, S2 normal. No murmurs, rubs, or gallops.  ABDOMEN: Soft,  nontender, nondistended. Bowel sounds present. No organomegaly or mass.  EXTREMITIES: No pedal edema, cyanosis, or clubbing.  NEUROLOGIC: Cranial nerves II through XII are intact. Muscle strength 5/5 in all extremities. Sensation intact. Gait not checked.  PSYCHIATRIC: The patient is alert and oriented x 3.  SKIN: No obvious rash, lesion, or ulcer.    LABORATORY PANEL:   CBC  Recent Labs Lab 05/17/16 0858  WBC 10.5  HGB 14.3  HCT 40.8  PLT 236   ------------------------------------------------------------------------------------------------------------------  Chemistries   Recent Labs Lab 05/17/16 0858  NA 139  K 3.5  CL 104  CO2 26  GLUCOSE 88  BUN 13  CREATININE 1.02*  CALCIUM 8.7*  AST 20  ALT 26  ALKPHOS 73  BILITOT 0.6   ------------------------------------------------------------------------------------------------------------------  Cardiac Enzymes  Recent Labs Lab 05/17/16 0858  TROPONINI <0.03   ------------------------------------------------------------------------------------------------------------------  RADIOLOGY:  Dg Chest 2 View  Result Date: 05/17/2016 CLINICAL DATA:  Fever, shortness of breath, cough for 3 weeks, history of asthma EXAM: CHEST  2 VIEW COMPARISON:  09/15/2014 FINDINGS: Cardiomediastinal silhouette is stable. No infiltrate or pulmonary edema. There are mild infrahilar and basilar bronchitic changes. Bony thorax is stable. IMPRESSION: No infiltrate or pulmonary edema. Mild infrahilar and basilar bronchitic changes. Electronically Signed   By: Natasha MeadLiviu  Pop M.D.   On: 05/17/2016 09:51    EKG:   Orders placed or performed during the hospital encounter of 05/17/16  . ED EKG  . ED EKG  . EKG 12-Lead  . EKG 12-Lead    ASSESSMENT AND PLAN:   46 year old female with past medical history significant for asthma, SVT Krebs to the hospital secondary to respiratory symptoms and noted to have asthma exacerbation and influenza  illness.  #1 influenza A illness-still having low-grade fevers. -Continue supportive treatment. On Tamiflu.  #2 acute on chronic asthma exacerbation-not on home oxygen, currently requiring 2 L -Continue IV steroids, nebulizer treatments and inhalers. -Wean oxygen as tolerated.  #3 DVT prophylaxis-on Lovenox     All the records are reviewed and case discussed with Care Management/Social Workerr. Management plans discussed with the patient, family and they are in agreement.  CODE STATUS: Full code  TOTAL TIME TAKING CARE OF THIS PATIENT: 38 minutes.   POSSIBLE D/C IN 1-2 DAYS, DEPENDING ON CLINICAL CONDITION.   Enid Baas M.D on 05/18/2016 at 1:15 PM  Between 7am to 6pm - Pager - [phone]  After 6pm go to [email or web address] - Social research officer, government  [facility]  Office  [phone]  CC: Primary care physician; No PCP Per Patient

## 2016-05-19 MED ORDER — BUDESONIDE 0.25 MG/2ML IN SUSP
0.2500 mg | Freq: Two times a day (BID) | RESPIRATORY_TRACT | Status: DC
  Administered 2016-05-19 – 2016-05-20 (×3): 0.25 mg via RESPIRATORY_TRACT
  Filled 2016-05-19 (×3): qty 2

## 2016-05-19 MED ORDER — KETOROLAC TROMETHAMINE 30 MG/ML IJ SOLN
30.0000 mg | Freq: Once | INTRAMUSCULAR | Status: AC
  Administered 2016-05-19: 30 mg via INTRAVENOUS
  Filled 2016-05-19: qty 1

## 2016-05-19 MED ORDER — OSELTAMIVIR PHOSPHATE 75 MG PO CAPS: 75.0000 mg | ORAL_CAPSULE | Freq: Two times a day (BID) | ORAL | 0 refills | Status: DC

## 2016-05-19 MED ORDER — HYDROCOD POLST-CPM POLST ER 10-8 MG/5ML PO SUER: 5.0000 mL | Freq: Two times a day (BID) | ORAL | 0 refills | Status: DC | PRN

## 2016-05-19 MED ORDER — BENZONATATE 100 MG PO CAPS
200.0000 mg | ORAL_CAPSULE | Freq: Three times a day (TID) | ORAL | Status: DC
  Administered 2016-05-19 – 2016-05-20 (×4): 200 mg via ORAL
  Filled 2016-05-19 (×4): qty 2

## 2016-05-19 MED ORDER — BENZONATATE 200 MG PO CAPS: 200.0000 mg | ORAL_CAPSULE | Freq: Three times a day (TID) | ORAL | 0 refills | Status: DC

## 2016-05-19 MED ORDER — PREDNISONE 10 MG (21) PO TBPK: 10.0000 mg | ORAL_TABLET | Freq: Every day | ORAL | 0 refills | Status: DC

## 2016-05-19 NOTE — Progress Notes (Signed)
[facility] - [place] at [facility]   PATIENT NAME: Tasha Miller    MR#:  [id number]  DATE OF BIRTH:  05/10/1970  SUBJECTIVE:  CHIEF COMPLAINT:   Chief Complaint  Patient presents with  . Shortness of Breath  . Respiratory Distress   - no fevers today, cough still present - feels about the same as yesterday  REVIEW OF SYSTEMS:  Review of Systems  Constitutional: Negative for chills, fever and malaise/fatigue.  HENT: Negative for congestion, ear discharge, hearing loss and nosebleeds.   Eyes: Negative for blurred vision and double vision.  Respiratory: Positive for cough, shortness of breath and wheezing.   Cardiovascular: Negative for chest pain, palpitations and leg swelling.  Gastrointestinal: Negative for abdominal pain, constipation, diarrhea, nausea and vomiting.  Genitourinary: Negative for dysuria.  Neurological: Negative for dizziness, speech change, focal weakness, seizures and headaches.  Psychiatric/Behavioral: Negative for depression.    DRUG ALLERGIES:   Allergies  Allergen Reactions  . Sulfonamide Derivatives Nausea And Vomiting  . Latex Itching and Swelling    VITALS:  Blood pressure 120/60, pulse 97, temperature 98.1 F (36.7 C), temperature source Oral, resp. rate 18, height 5\' 3"  (1.6 m), weight 67.1 kg (148 lb), SpO2 98 %.  PHYSICAL EXAMINATION:  Physical Exam  GENERAL:  46 y.o.-year-old patient lying in the bed with no acute distress.  EYES: Pupils equal, round, reactive to light and accommodation. No scleral icterus. Extraocular muscles intact.  HEENT: Head atraumatic, normocephalic. Oropharynx and nasopharynx clear.  NECK:  Supple, no jugular venous distention. No thyroid enlargement, no tenderness.  LUNGS:Scattered expiratory wheezing bilaterally, but good air movement, no rales,rhonchi or crepitation. No use of accessory muscles of respiration.  CARDIOVASCULAR: S1, S2 normal. No murmurs, rubs, or gallops.  ABDOMEN: Soft,  nontender, nondistended. Bowel sounds present. No organomegaly or mass.  EXTREMITIES: No pedal edema, cyanosis, or clubbing.  NEUROLOGIC: Cranial nerves II through XII are intact. Muscle strength 5/5 in all extremities. Sensation intact. Gait not checked.  PSYCHIATRIC: The patient is alert and oriented x 3.  SKIN: No obvious rash, lesion, or ulcer.    LABORATORY PANEL:   CBC  Recent Labs Lab 05/17/16 0858  WBC 10.5  HGB 14.3  HCT 40.8  PLT 236   ------------------------------------------------------------------------------------------------------------------  Chemistries   Recent Labs Lab 05/17/16 0858  NA 139  K 3.5  CL 104  CO2 26  GLUCOSE 88  BUN 13  CREATININE 1.02*  CALCIUM 8.7*  AST 20  ALT 26  ALKPHOS 73  BILITOT 0.6   ------------------------------------------------------------------------------------------------------------------  Cardiac Enzymes  Recent Labs Lab 05/17/16 0858  TROPONINI <0.03   ------------------------------------------------------------------------------------------------------------------  RADIOLOGY:  Dg Chest 2 View  Result Date: 05/17/2016 CLINICAL DATA:  Fever, shortness of breath, cough for 3 weeks, history of asthma EXAM: CHEST  2 VIEW COMPARISON:  09/15/2014 FINDINGS: Cardiomediastinal silhouette is stable. No infiltrate or pulmonary edema. There are mild infrahilar and basilar bronchitic changes. Bony thorax is stable. IMPRESSION: No infiltrate or pulmonary edema. Mild infrahilar and basilar bronchitic changes. Electronically Signed   By: Natasha MeadLiviu  Pop M.D.   On: 05/17/2016 09:51    EKG:   Orders placed or performed during the hospital encounter of 05/17/16  . ED EKG  . ED EKG  . EKG 12-Lead  . EKG 12-Lead    ASSESSMENT AND PLAN:   46 year old female with past medical history significant for asthma, SVT Krebs to the hospital secondary to respiratory symptoms and noted to have asthma exacerbation and influenza  illness.  #1 influenza A illness-fevers resolved. -Continue supportive treatment. On Tamiflu.  #2 acute on chronic asthma exacerbation-not on home oxygen, currently requiring 2 L -Continue IV steroids, nebulizer treatments and inhalers. -Wean oxygen as tolerated. - cough meds adjusted  #3 H/o SVT- continue flecanaide  #4 DVT prophylaxis-on Lovenox     All the records are reviewed and case discussed with Care Management/Social Workerr. Management plans discussed with the patient, family and they are in agreement.  CODE STATUS: Full code  TOTAL TIME TAKING CARE OF THIS PATIENT: 37 minutes.   POSSIBLE D/C TODAY OR TOMORROW, DEPENDING ON CLINICAL CONDITION.   Enid Baas M.D on 05/19/2016 at 8:44 AM  Between 7am to 6pm - Pager - [phone]  After 6pm go to [email or web address] - Social research officer, government  [facility] [place] [facility]  Office  [phone]  CC: Primary care physician; No PCP Per Patient

## 2016-05-19 NOTE — Progress Notes (Addendum)
     Tasha Miller was admitted to the [facility] on 05/17/2016 for an acute medical condition and is being Discharged on 05/20/2016. She will need another 3-4 days for recovery and so advised to stay away from work until then. So please excuse her from work for the above Days. Should be able to return to work without any restrictions from 05/26/16.  Call Tasha Baasadhika Annistyn Depass  MD, [facility] at  [phone] with questions.  Tasha BaasKALISETTI,Tasha Miller M.D on 05/20/2016, at 8:48 AM  [facility] [address], [place] [id number]

## 2016-05-20 MED ORDER — FLUTICASONE PROPIONATE HFA 220 MCG/ACT IN AERO: 2.0000 | INHALATION_SPRAY | Freq: Two times a day (BID) | RESPIRATORY_TRACT | 12 refills | Status: DC

## 2016-05-20 NOTE — Progress Notes (Signed)
Patient discharge teaching given, including activity, diet, follow-up appoints, and medications. Patient verbalized understanding of all discharge instructions. IV access was d/c'd. Vitals are stable. Skin is intact except as charted in most recent assessments. Pt refused to be escorted out, to be driven home by family.  Tasha Miller  

## 2016-05-20 NOTE — Discharge Summary (Signed)
[facility] - [place] at [facility]   PATIENT NAME: Tasha Miller    MR#:  [id number]  DATE OF BIRTH:  Mar 20, 1971  DATE OF ADMISSION:  05/17/2016   ADMITTING PHYSICIAN: Enedina Finner, MD  DATE OF DISCHARGE: 05/20/2016  1:11 PM  PRIMARY CARE PHYSICIAN: No PCP Per Patient   ADMISSION DIAGNOSIS:   Influenza A [J10.1] Hypoxia [R09.02]  DISCHARGE DIAGNOSIS:   Active Problems:   Sepsis (HCC)   SECONDARY DIAGNOSIS:   Past Medical History:  Diagnosis Date  . PONV (postoperative nausea and vomiting)   . Rubella   . SVT (supraventricular tachycardia) [facility])     HOSPITAL COURSE:   46 year old female with past medical history significant for asthma, SVT Krebs to the hospital secondary to respiratory symptoms and noted to have asthma exacerbation and influenza illness.  #1 influenza A illness-fevers resolved. -received supportive treatment. On Tamiflu.  #2 acute on chronic asthma exacerbation-not on home oxygen, initially required 2 L- weaned off by discharge -received IV steroids- changed to oral prednisone taper, nebulizer treatments and inhalers. - cough meds adjusted  #3 H/o SVT- continue flecanaide  Much improved, afebrile and breathing comfortably prior to discharge   DISCHARGE CONDITIONS:   Stable  CONSULTS OBTAINED:   None  DRUG ALLERGIES:   Allergies  Allergen Reactions  . Sulfonamide Derivatives Nausea And Vomiting  . Latex Itching and Swelling   DISCHARGE MEDICATIONS:   Allergies as of 05/20/2016      Reactions   Sulfonamide Derivatives Nausea And Vomiting   Latex Itching, Swelling      Medication List    STOP taking these medications   cefdinir 300 MG capsule Commonly known as:  OMNICEF     TAKE these medications   albuterol 108 (90 Base) MCG/ACT inhaler Commonly known as:  PROVENTIL HFA;VENTOLIN HFA Inhale 2 puffs into the lungs every 6 (six) hours as needed for wheezing or shortness of breath.   benzonatate 200 MG  capsule Commonly known as:  TESSALON Take 1 capsule (200 mg total) by mouth 3 (three) times daily.   chlorpheniramine-HYDROcodone 10-8 MG/5ML Suer Commonly known as:  TUSSIONEX PENNKINETIC ER Take 5 mLs by mouth every 12 (twelve) hours as needed for cough.   Coconut Oil 1000 MG Caps Take 2 capsules by mouth daily.   flecainide 50 MG tablet Commonly known as:  TAMBOCOR Take 50 mg by mouth daily.   fluticasone 220 MCG/ACT inhaler Commonly known as:  FLOVENT HFA Inhale 2 puffs into the lungs 2 (two) times daily.   ibuprofen 200 MG tablet Commonly known as:  MOTRIN IB Take 3 tablets (600 mg total) by mouth every 6 (six) hours as needed.   ipratropium-albuterol 0.5-2.5 (3) MG/3ML Soln Commonly known as:  DUONEB Take 3 mLs by nebulization every 4 (four) hours as needed.   oseltamivir 75 MG capsule Commonly known as:  TAMIFLU Take 1 capsule (75 mg total) by mouth 2 (two) times daily. X 3 more days   predniSONE 10 MG (21) Tbpk tablet Commonly known as:  STERAPRED UNI-PAK 21 TAB Take 1 tablet (10 mg total) by mouth daily. 6 tabs PO x 1 day 5 tabs PO x 1 day 4 tabs PO x 1 day 3 tabs PO x 1 day 2 tabs PO x 1 day 1 tab PO x 1 day and stop   traMADol 50 MG tablet Commonly known as:  ULTRAM Take 1 tablet (50 mg total) by mouth every 6 (six) hours as needed for moderate  pain.   Vitamin D (Ergocalciferol) 50000 units Caps capsule Commonly known as:  DRISDOL Take 1 capsule (50,000 Units total) by mouth every 7 (seven) days.        DISCHARGE INSTRUCTIONS:   1. PCP f/u in 1-2 weeks  DIET:   Regular diet  ACTIVITY:   Activity as tolerated  OXYGEN:   Home Oxygen: No.  Oxygen Delivery: room air  DISCHARGE LOCATION:   home   If you experience worsening of your admission symptoms, develop shortness of breath, life threatening emergency, suicidal or homicidal thoughts you must seek medical attention immediately by calling 911 or calling your MD immediately  if symptoms less  severe.  You Must read complete instructions/literature along with all the possible adverse reactions/side effects for all the Medicines you take and that have been prescribed to you. Take any new Medicines after you have completely understood and accpet all the possible adverse reactions/side effects.   Please note  You were cared for by a hospitalist during your hospital stay. If you have any questions about your discharge medications or the care you received while you were in the hospital after you are discharged, you can call the unit and asked to speak with the hospitalist on call if the hospitalist that took care of you is not available. Once you are discharged, your primary care physician will handle any further medical issues. Please note that NO REFILLS for any discharge medications will be authorized once you are discharged, as it is imperative that you return to your primary care physician (or establish a relationship with a primary care physician if you do not have one) for your aftercare needs so that they can reassess your need for medications and monitor your lab values.    On the day of Discharge:  VITAL SIGNS:   Blood pressure 122/63, pulse 69, temperature 98.4 F (36.9 C), temperature source Oral, resp. rate 20, height 5\' 3"  (1.6 m), weight 67.1 kg (148 lb), SpO2 100 %.  PHYSICAL EXAMINATION:     GENERAL:  46 y.o.-year-old patient lying in the bed with no acute distress.  EYES: Pupils equal, round, reactive to light and accommodation. No scleral icterus. Extraocular muscles intact.  HEENT: Head atraumatic, normocephalic. Oropharynx and nasopharynx clear.  NECK:  Supple, no jugular venous distention. No thyroid enlargement, no tenderness.  LUNGS: improved wheezing bilaterally, good air movement, no rales,rhonchi or crepitation. No use of accessory muscles of respiration.  CARDIOVASCULAR: S1, S2 normal. No murmurs, rubs, or gallops.  ABDOMEN: Soft, nontender, nondistended.  Bowel sounds present. No organomegaly or mass.  EXTREMITIES: No pedal edema, cyanosis, or clubbing.  NEUROLOGIC: Cranial nerves II through XII are intact. Muscle strength 5/5 in all extremities. Sensation intact. Gait not checked.  PSYCHIATRIC: The patient is alert and oriented x 3.  SKIN: No obvious rash, lesion, or ulcer.    DATA REVIEW:   CBC  Recent Labs Lab 05/17/16 0858  WBC 10.5  HGB 14.3  HCT 40.8  PLT 236    Chemistries   Recent Labs Lab 05/17/16 0858  NA 139  K 3.5  CL 104  CO2 26  GLUCOSE 88  BUN 13  CREATININE 1.02*  CALCIUM 8.7*  AST 20  ALT 26  ALKPHOS 73  BILITOT 0.6     Microbiology Results  No results found for this or any previous visit.  RADIOLOGY:  No results found.   Management plans discussed with the patient, family and they are in agreement.  CODE STATUS:  Code Status Orders        Start     Ordered   05/17/16 1230  Full code  Continuous     05/17/16 1229    Code Status History    Date Active Date Inactive Code Status Order ID Comments User Context   This patient has a current code status but no historical code status.      TOTAL TIME TAKING CARE OF THIS PATIENT: 37 minutes.    Enid BaasKALISETTI,Fredderick Swanger M.D on 05/20/2016 at 3:41 PM  Between 7am to 6pm - Pager - [phone]  After 6pm go to [email or web address] - Scientist, research (life sciences)password [email or web address] [email or web address]  [facility] [place] [facility]  Office  [phone]  CC: Primary care physician; No PCP Per Patient   Note: This dictation was prepared with Dragon dictation along with smaller phrase technology. Any transcriptional errors that result from this process are unintentional.

## 2016-10-09 ENCOUNTER — Other Ambulatory Visit: Payer: Self-pay | Admitting: *Deleted

## 2016-10-09 ENCOUNTER — Ambulatory Visit: Payer: Self-pay | Admitting: Family

## 2016-10-09 VITALS — BP 130/80 | HR 98 | Temp 98.4°F

## 2016-10-09 DIAGNOSIS — L309 Dermatitis, unspecified: Secondary | ICD-10-CM

## 2016-10-09 MED ORDER — PREDNISONE 10 MG (21) PO TBPK: 10.0000 mg | ORAL_TABLET | Freq: Every day | ORAL | 0 refills | Status: DC

## 2016-10-09 NOTE — Progress Notes (Signed)
S/: Tasha Miller is here today for a persistent rash which started about a week ago after mowing her grass. Itching is present. History of eczema and treatment for skin condition at dermatology for which she cannot remember the name. She received a large jar of a cream apparently compounded in [place]. No new foods, meds, clothing or products. No tick exposure. ROS neg  O/: Vital signs stable alert pleasant nontoxic fine red confluent rash over the forearms back, lower leg with single red small papules scant,she shows a picture of her ankle after being in the heat which is erythematous and with isolated white hard papules to the medial ankle; the white papules are present without erythema  A/: Dermatitis  P/: Pred taper with instructions 6 days. Tepid baths. Cut nails. She will call the [facility] to obtain the name of the cream from dermatology and also make a follow-up appointment with them ASAP.

## 2016-10-09 NOTE — Telephone Encounter (Signed)
Pt called clinic in regards to f/u from appt today to report name of medication she has previously had filled that worked well for her skin condition.  Cordran [facility] in [place], [place]. Requests new Rx be called in there.

## 2016-10-21 ENCOUNTER — Encounter: Payer: Self-pay | Admitting: *Deleted

## 2016-10-21 NOTE — Telephone Encounter (Signed)
[facility]  Chandani Rogowski Maximiano CossAnne Deadra Diggins FNP

## 2017-02-28 ENCOUNTER — Encounter: Payer: Self-pay | Admitting: Physician Assistant

## 2017-02-28 ENCOUNTER — Ambulatory Visit: Payer: Self-pay | Admitting: Physician Assistant

## 2017-02-28 VITALS — BP 110/80 | HR 83 | Temp 98.2°F

## 2017-02-28 DIAGNOSIS — R319 Hematuria, unspecified: Principal | ICD-10-CM

## 2017-02-28 DIAGNOSIS — R42 Dizziness and giddiness: Secondary | ICD-10-CM

## 2017-02-28 DIAGNOSIS — N390 Urinary tract infection, site not specified: Secondary | ICD-10-CM

## 2017-02-28 LAB — POCT URINALYSIS DIPSTICK
Bilirubin, UA: NEGATIVE
Glucose, UA: NEGATIVE
Ketones, UA: NEGATIVE
Nitrite, UA: NEGATIVE
Protein, UA: NEGATIVE
Spec Grav, UA: 1.025 (ref 1.010–1.025)
Urobilinogen, UA: 0.2 E.U./dL
pH, UA: 6 (ref 5.0–8.0)

## 2017-02-28 MED ORDER — FLUCONAZOLE 150 MG PO TABS: ORAL_TABLET | ORAL | 0 refills | Status: DC

## 2017-02-28 MED ORDER — FLUTICASONE PROPIONATE 50 MCG/ACT NA SUSP: 2.0000 | Freq: Every day | NASAL | 6 refills | Status: DC

## 2017-02-28 MED ORDER — MECLIZINE HCL 25 MG PO TABS: 25.0000 mg | ORAL_TABLET | Freq: Three times a day (TID) | ORAL | 0 refills | Status: DC | PRN

## 2017-02-28 MED ORDER — CIPROFLOXACIN HCL 250 MG PO TABS: 250.0000 mg | ORAL_TABLET | Freq: Two times a day (BID) | ORAL | 0 refills | Status: DC

## 2017-02-28 MED ORDER — PHENAZOPYRIDINE HCL 200 MG PO TABS: 200.0000 mg | ORAL_TABLET | Freq: Three times a day (TID) | ORAL | 0 refills | Status: DC | PRN

## 2017-02-28 NOTE — Progress Notes (Signed)
S:  C/o uti sx for  days, burning, urgency, frequency, pain; denies vaginal discharge, abdominal pain or flank pain: Also complains of dizziness. It's intermittent does not last but for a few seconds. Was worse when she got out of shower this morning. Is not dizzy at this time. Remainder ros neg  O:  Vitals wnl, nad, no cva tenderness, back nontender, lungs c t a,cv rrr, abd soft nontender, bs normal, n/v intact, ua leuk 1, blood 2  A: uti, vertigo  P: cipro 250mg  bid x 7d, diflucan, pyridium, flonase, antivert;  increase water intake, add cranberry juice, return if not improving in 2 -3 days, return earlier if worsening, discussed pyelonephritis sx

## 2017-03-04 LAB — URINE CULTURE

## 2017-04-29 ENCOUNTER — Ambulatory Visit: Payer: Self-pay | Admitting: Emergency Medicine

## 2017-04-29 VITALS — BP 110/60 | HR 79 | Temp 98.2°F | Resp 16

## 2017-04-29 DIAGNOSIS — J0100 Acute maxillary sinusitis, unspecified: Secondary | ICD-10-CM

## 2017-04-29 DIAGNOSIS — J4521 Mild intermittent asthma with (acute) exacerbation: Secondary | ICD-10-CM

## 2017-04-29 DIAGNOSIS — Z20828 Contact with and (suspected) exposure to other viral communicable diseases: Secondary | ICD-10-CM

## 2017-04-29 LAB — POCT RAPID STREP A (OFFICE): Rapid Strep A Screen: NEGATIVE

## 2017-04-29 LAB — POCT INFLUENZA A/B
Influenza A, POC: NEGATIVE
Influenza B, POC: NEGATIVE

## 2017-04-29 MED ORDER — IPRATROPIUM-ALBUTEROL 0.5-2.5 (3) MG/3ML IN SOLN: 3.0000 mL | RESPIRATORY_TRACT | 1 refills | Status: DC | PRN

## 2017-04-29 MED ORDER — FLUTICASONE PROPIONATE HFA 220 MCG/ACT IN AERO: 2.0000 | INHALATION_SPRAY | Freq: Two times a day (BID) | RESPIRATORY_TRACT | 3 refills | Status: DC

## 2017-04-29 MED ORDER — OSELTAMIVIR PHOSPHATE 75 MG PO CAPS: 75.0000 mg | ORAL_CAPSULE | Freq: Two times a day (BID) | ORAL | 0 refills | Status: DC

## 2017-04-29 MED ORDER — CEFDINIR 300 MG PO CAPS: ORAL_CAPSULE | ORAL | 0 refills | Status: DC

## 2017-04-29 NOTE — Progress Notes (Signed)
Subjective. Patient has been feeling  Poorly the last few days. She has had a congestion scratchy throat and a cough which is essentially dry. Over the last few weeks she has been bothered with significant sinus congestion fullness on the right side of her face. She is concerned that she possibly has a sinus infection that has not been treated.. Review of systems. Pertinent history reveals that one year ago patient had influenza A. This was followed by an exacerbation of asthma requiring 3 days of hospitalization. Her illness had dragged on for 2-3 weeks before she was hospitalized. She is not a smoker but had a lot of second hand smoke growing up. She started her inhalers yesterday and has been using albuterol and Flovent 220. Objective. Gen. Alert and cooperative not in acute distress not air hungry. TMs clear nose congested. Throat slightly red. Neck supple no adenopathy. Chest. Breath sounds are symmetrical. There is some prolongation of expiration on the right. No rales were heard. There is good air exchange.  Heart.Regular rate and rhythm without murmurs. Extremities warm with good color. Assessment  Patient presents with respiratory illness with increasing need of her inhalers. She does not use them all the time. She is under a great deal of stress at home with going through a divorce. She is tearful at times.She has the number for the EAP person but has not called them. We had a discussion that she is  At increased risk for progressive respiratory symptoms. She understands this and agrees to go to the ER if worsening. Plan. Duoneb 1 every 4-6 hours. Flovent 220  2 puffs twice a day. Tamiflu 75 twice a day for 5 days. Patient advised to go to the emergency room if she had any increasing shortness of breath, difficulty breathing, or worsening symptoms. She was advised to call the number for EAP. omnicef 300 twice a day for 10 days

## 2017-04-29 NOTE — Addendum Note (Signed)
Addended by: Catha BrowEACON, MONIQUE T on: 04/29/2017 03:55 PM   Modules accepted: Orders

## 2017-05-05 ENCOUNTER — Encounter: Payer: Self-pay | Admitting: Physician Assistant

## 2017-05-05 ENCOUNTER — Ambulatory Visit: Payer: Self-pay | Admitting: Physician Assistant

## 2017-05-05 VITALS — BP 124/80 | HR 80 | Temp 98.4°F

## 2017-05-05 DIAGNOSIS — J069 Acute upper respiratory infection, unspecified: Secondary | ICD-10-CM

## 2017-05-05 MED ORDER — FEXOFENADINE-PSEUDOEPHED ER 60-120 MG PO TB12: 1.0000 | ORAL_TABLET | Freq: Two times a day (BID) | ORAL | 0 refills | Status: DC

## 2017-05-05 MED ORDER — BENZONATATE 200 MG PO CAPS: 200.0000 mg | ORAL_CAPSULE | Freq: Three times a day (TID) | ORAL | 0 refills | Status: DC | PRN

## 2017-05-05 NOTE — Progress Notes (Signed)
   Subjective: URI     Patient ID: Tasha Miller, female    DOB: 09/23/1970, 47 y.o.   MRN: [id number]  HPI Patient here today for follow-up for Mr. infection. Patient was seen 5 days ago at this facility. Patient state she continue previous medication consisting of steroids and inhalers. Patient states she continues to have decreased forced volume but has improved since last visit. Patient did discontinue nasal congestion intermittently rhinorrhea. He states she continue to cough but only using the Tussionex at night. Patient denies fever/chills. Patient denies nausea, vomiting, or diarrhea. Patient has taken a flu shot this season. Patient tested negative for flu and strep on her last visit.  Review of Systems    negative except for complaint Objective:   Physical Exam HEENT is more edematous nasal turbinates clear rhinorrhea. Postnasal drainage and erythematous pharynx. Decreased forced volume. Neck is supple without adenopathy. Lungs are clear to auscultation heart regular rate and rhythm.       Assessment & Plan: URI   Discussed resolving upper respiratory infection. Patient continue previous medications and start Allegra-D and Tessalon Perles as directed. Follow-up as needed.

## 2017-05-07 ENCOUNTER — Encounter: Payer: Self-pay | Admitting: Registered Nurse

## 2017-05-07 ENCOUNTER — Telehealth: Payer: Self-pay | Admitting: Registered Nurse

## 2017-05-07 MED ORDER — FLUCONAZOLE 150 MG PO TABS: ORAL_TABLET | ORAL | 0 refills | Status: DC

## 2017-05-07 NOTE — Telephone Encounter (Signed)
Patient on oral antibiotics omnicef started monday and requesting diflucan oral for vaginal candidiasis symptoms.  Has taken previously without side effects.  Electronic Rx entered for patient.  CMA Catha BrowMonique Deacon notified patient and she verbalized understanding information/instructions.

## 2017-06-19 ENCOUNTER — Emergency Department: Payer: Managed Care, Other (non HMO)

## 2017-06-19 ENCOUNTER — Encounter: Payer: Self-pay | Admitting: Emergency Medicine

## 2017-06-19 ENCOUNTER — Emergency Department
Admission: EM | Admit: 2017-06-19 | Discharge: 2017-06-19 | Disposition: A | Discharge status: Home / Self Care | Payer: Managed Care, Other (non HMO) | Attending: Emergency Medicine | Admitting: Emergency Medicine

## 2017-06-19 ENCOUNTER — Other Ambulatory Visit: Payer: Self-pay

## 2017-06-19 DIAGNOSIS — R1084 Generalized abdominal pain: Secondary | ICD-10-CM | POA: Diagnosis present

## 2017-06-19 DIAGNOSIS — N23 Unspecified renal colic: Secondary | ICD-10-CM | POA: Insufficient documentation

## 2017-06-19 DIAGNOSIS — I471 Supraventricular tachycardia: Secondary | ICD-10-CM | POA: Diagnosis not present

## 2017-06-19 LAB — COMPREHENSIVE METABOLIC PANEL
ALT: 23 U/L (ref 14–54)
AST: 35 U/L (ref 15–41)
Albumin: 4.5 g/dL (ref 3.5–5.0)
Alkaline Phosphatase: 66 U/L (ref 38–126)
Anion gap: 8 (ref 5–15)
BUN: 20 mg/dL (ref 6–20)
CO2: 25 mmol/L (ref 22–32)
Calcium: 9.1 mg/dL (ref 8.9–10.3)
Chloride: 108 mmol/L (ref 101–111)
Creatinine, Ser: 0.93 mg/dL (ref 0.44–1.00)
GFR calc Af Amer: >60 mL/min (ref >60)
GFR calc non Af Amer: >60 mL/min (ref >60)
Glucose, Bld: 136 mg/dL — ABNORMAL HIGH (ref 65–99)
Potassium: 3.7 mmol/L (ref 3.5–5.1)
Sodium: 141 mmol/L (ref 135–145)
Total Bilirubin: 0.8 mg/dL (ref 0.3–1.2)
Total Protein: 8 g/dL (ref 6.5–8.1)

## 2017-06-19 LAB — URINALYSIS, COMPLETE (UACMP) WITH MICROSCOPIC
Bacteria, UA: NONE SEEN
Bilirubin Urine: NEGATIVE
Glucose, UA: NEGATIVE mg/dL
Hgb urine dipstick: NEGATIVE
Ketones, ur: NEGATIVE mg/dL
Leukocytes, UA: NEGATIVE
Nitrite: NEGATIVE
Protein, ur: NEGATIVE mg/dL
Specific Gravity, Urine: 1.028 (ref 1.005–1.030)
pH: 5 (ref 5.0–8.0)

## 2017-06-19 LAB — CBC WITH DIFFERENTIAL/PLATELET
Basophils Absolute: 0.1 10*3/uL (ref 0–0.1)
Basophils Relative: 1 %
Eosinophils Absolute: 0.2 10*3/uL (ref 0–0.7)
Eosinophils Relative: 2 %
HCT: 44.7 % (ref 35.0–47.0)
Hemoglobin: 15.2 g/dL (ref 12.0–16.0)
Lymphocytes Relative: 43 %
Lymphs Abs: 3.4 10*3/uL (ref 1.0–3.6)
MCH: 29.1 pg (ref 26.0–34.0)
MCHC: 33.9 g/dL (ref 32.0–36.0)
MCV: 85.7 fL (ref 80.0–100.0)
Monocytes Absolute: 0.4 10*3/uL (ref 0.2–0.9)
Monocytes Relative: 5 %
Neutro Abs: 3.8 10*3/uL (ref 1.4–6.5)
Neutrophils Relative %: 49 %
Platelets: 228 10*3/uL (ref 150–440)
RBC: 5.22 MIL/uL — ABNORMAL HIGH (ref 3.80–5.20)
RDW: 13.2 % (ref 11.5–14.5)
WBC: 7.8 10*3/uL (ref 3.6–11.0)

## 2017-06-19 MED ORDER — MORPHINE SULFATE (PF) 4 MG/ML IV SOLN
4.0000 mg | Freq: Once | INTRAVENOUS | Status: DC
  Filled 2017-06-19: qty 1

## 2017-06-19 MED ORDER — HYDROMORPHONE HCL 1 MG/ML IJ SOLN
0.5000 mg | Freq: Once | INTRAMUSCULAR | Status: AC
  Administered 2017-06-19: 0.5 mg via INTRAVENOUS
  Filled 2017-06-19: qty 1

## 2017-06-19 MED ORDER — HYDROMORPHONE HCL 1 MG/ML IJ SOLN
1.0000 mg | Freq: Once | INTRAMUSCULAR | Status: AC
  Administered 2017-06-19: 1 mg via INTRAVENOUS
  Filled 2017-06-19: qty 1

## 2017-06-19 MED ORDER — KETOROLAC TROMETHAMINE 30 MG/ML IJ SOLN
30.0000 mg | Freq: Once | INTRAMUSCULAR | Status: DC
  Filled 2017-06-19: qty 1

## 2017-06-19 MED ORDER — SODIUM CHLORIDE 0.9 % IV SOLN
Freq: Once | INTRAVENOUS | Status: AC
  Administered 2017-06-19: 08:00:00 via INTRAVENOUS

## 2017-06-19 MED ORDER — ONDANSETRON HCL 4 MG/2ML IJ SOLN
4.0000 mg | Freq: Once | INTRAMUSCULAR | Status: DC
  Filled 2017-06-19: qty 2

## 2017-06-19 MED ORDER — HYDROMORPHONE HCL 1 MG/ML IJ SOLN
INTRAMUSCULAR | Status: AC
  Filled 2017-06-19: qty 1

## 2017-06-19 MED ORDER — HYDROMORPHONE HCL 1 MG/ML IJ SOLN
1.0000 mg | Freq: Once | INTRAMUSCULAR | Status: AC
  Administered 2017-06-19: 1 mg via INTRAVENOUS

## 2017-06-19 MED ORDER — DIPHENHYDRAMINE HCL 50 MG/ML IJ SOLN
12.5000 mg | Freq: Once | INTRAMUSCULAR | Status: AC
  Administered 2017-06-19: 12.5 mg via INTRAVENOUS

## 2017-06-19 MED ORDER — KETOROLAC TROMETHAMINE 30 MG/ML IJ SOLN
30.0000 mg | Freq: Once | INTRAMUSCULAR | Status: AC
  Administered 2017-06-19: 30 mg via INTRAVENOUS

## 2017-06-19 MED ORDER — DIPHENHYDRAMINE HCL 50 MG/ML IJ SOLN
INTRAMUSCULAR | Status: AC
  Filled 2017-06-19: qty 1

## 2017-06-19 MED ORDER — ONDANSETRON HCL 4 MG/2ML IJ SOLN
4.0000 mg | Freq: Once | INTRAMUSCULAR | Status: AC
Start: 2017-06-19 — End: 2017-06-19
  Administered 2017-06-19: 4 mg via INTRAVENOUS
  Filled 2017-06-19: qty 2

## 2017-06-19 MED ORDER — LORAZEPAM 2 MG/ML IJ SOLN
0.5000 mg | Freq: Once | INTRAMUSCULAR | Status: AC
  Administered 2017-06-19: 0.5 mg via INTRAVENOUS
  Filled 2017-06-19: qty 1

## 2017-06-19 MED ORDER — OXYCODONE-ACETAMINOPHEN 7.5-325 MG PO TABS: 1.0000 | ORAL_TABLET | ORAL | 0 refills | Status: DC | PRN

## 2017-06-19 MED ORDER — MORPHINE SULFATE (PF) 4 MG/ML IV SOLN
4.0000 mg | Freq: Once | INTRAVENOUS | Status: AC
  Administered 2017-06-19: 4 mg via INTRAVENOUS

## 2017-06-19 MED ORDER — ONDANSETRON HCL 4 MG/2ML IJ SOLN
4.0000 mg | Freq: Once | INTRAMUSCULAR | Status: AC
  Administered 2017-06-19: 4 mg via INTRAVENOUS

## 2017-06-19 MED ORDER — ONDANSETRON 4 MG PO TBDP: 4.0000 mg | ORAL_TABLET | Freq: Three times a day (TID) | ORAL | 0 refills | Status: DC | PRN

## 2017-06-19 NOTE — ED Triage Notes (Signed)
Patient reports left-sided flank pain starting around 4 am. Reports pain radiates to back. History of kidney stones with similar symptoms. Patient unable to sit still on stretcher. Reports nausea with pain.

## 2017-06-19 NOTE — ED Provider Notes (Signed)
[facility] Emergency Department Provider Note       Time seen: ----------------------------------------- 7:09 AM on 06/19/2017 -----------------------------------------   I have reviewed the triage vital signs and the nursing notes.  HISTORY   Chief Complaint No chief complaint on file.    HPI Tasha Miller is a 47 y.o. female with a history of SVT, bipolar disorder who presents to the ED for left flank pain.  Patient describes sharp sudden onset flank pain around 4 AM this morning with nausea.  Patient reports a history of similar in the past with a kidney stone.  Pain is 10 out of 10 with associated nausea and vomiting, nothing makes it better.  Past Medical History:  Diagnosis Date  . PONV (postoperative nausea and vomiting)   . Rubella   . SVT (supraventricular tachycardia) [facility])     Patient Active Problem List   Diagnosis Date Noted  . Sepsis (HCC) 05/17/2016  . 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 Pruritus 01/15/2016  . SVT (supraventricular tachycardia) (HCC) 01/15/2016  . Hypertriglyceridemia 07/13/2013  . BIPOLAR AFFECTIVE DISORDER 05/21/2007    Past Surgical History:  Procedure Laterality Date  . DIAGNOSTIC LAPAROSCOPY    . left wrist surgery    . lymph node removal on right side of neck    . right elbow surgery    . rotator cuff surgery    . TONSILLECTOMY    . VAGINAL HYSTERECTOMY    . 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 WISDOM TOOTH EXTRACTION      Allergies Sulfonamide derivatives and Latex  Social History Social History   Tobacco Use  . Smoking status: Never Smoker  . Smokeless tobacco: Never Used  Substance Use Topics  . Alcohol use: No  . Drug use: No    Review of Systems Constitutional: Negative for fever. Cardiovascular: Negative for  chest pain. Respiratory: Negative for shortness of breath. Gastrointestinal: Positive for flank pain, vomiting Musculoskeletal: Positive for back pain Skin: Negative for rash. Neurological: Negative for headaches, focal weakness or numbness.  All systems negative/normal/unremarkable except as stated in the HPI  ____________________________________________   PHYSICAL EXAM:  VITAL SIGNS: ED Triage Vitals  Enc Vitals Group     BP      Pulse      Resp      Temp      Temp src      SpO2      Weight      Height      Head Circumference      Peak Flow      Pain Score      Pain Loc      Pain Edu?      Excl. in GC?    Constitutional: Alert and oriented.  Mild to moderate distress Eyes: Conjunctivae are normal. Normal extraocular movements. ENT   Head: Normocephalic and atraumatic.   Nose: No congestion/rhinnorhea.   Mouth/Throat: Mucous membranes are moist.   Neck: No stridor. Cardiovascular: Normal rate, regular rhythm. No murmurs, rubs, or gallops. Respiratory: Normal respiratory effort without tachypnea nor retractions. Breath sounds are clear and equal bilaterally. No wheezes/rales/rhonchi. Gastrointestinal: Left flank tenderness, normal bowel sounds Musculoskeletal: Nontender with normal range of motion in extremities. No lower extremity tenderness nor edema. Neurologic:  Normal speech and language. No gross focal neurologic deficits are  appreciated.  Skin:  Skin is warm, dry and intact. No rash noted. Psychiatric: Mood and affect are normal. Speech and behavior are normal.  ____________________________________________  ED COURSE:  As part of my medical decision making, I reviewed the following data within the electronic MEDICAL RECORD NUMBER History obtained from family if available, nursing notes, old chart and ekg, as well as notes from prior ED visits. Patient presented for left flank pain, we will assess with labs and imaging as indicated at this time.    Procedures ____________________________________________   LABS (pertinent positives/negatives)  Labs Reviewed  CBC WITH DIFFERENTIAL/PLATELET - Abnormal; Notable for the following components:      Result Value   RBC 5.22 (*)    All other components within normal limits  COMPREHENSIVE METABOLIC PANEL - Abnormal; Notable for the following components:   Glucose, Bld 136 (*)    All other components within normal limits  URINALYSIS, COMPLETE (UACMP) WITH MICROSCOPIC - Abnormal; Notable for the following components:   Color, Urine YELLOW (*)    APPearance CLOUDY (*)    Squamous Epithelial / LPF 6-30 (*)    All other components within normal limits    RADIOLOGY Images were viewed by me KUB IMPRESSION: Negative. No urologic calculus identified radiographically. CT abd/pelvis IMPRESSION: Mild left hydroureteronephrosis due to 3 mm distal ureteral calculus at the ureterovesical junction.  Tiny bilateral intrarenal calculi. ____________________________________________  DIFFERENTIAL DIAGNOSIS   Renal colic, UTI, pyelonephritis, ovarian cyst, ovarian torsion  FINAL ASSESSMENT AND PLAN  Renal colic   Plan: Patient had presented for left flank pain. Patient's labs did not reveal any acute process. Patient's imaging did reveal a distal left ureteral stone but she would likely be able to pass.  She did require multiple doses of pain medicine as well as antiemetics.  Currently she is feeling better and is stable for outpatient follow-up.   Ulice DashJohnathan E Miyoshi Ligas, MD   Note: This note was generated in part or whole with voice recognition software. Voice recognition is usually quite accurate but there are transcription errors that can and very often do occur. I apologize for any typographical errors that were not detected and corrected.     Emily FilbertWilliams, Coleson Kant E, MD 06/19/17 [phone]

## 2017-07-15 ENCOUNTER — Encounter: Payer: Self-pay | Admitting: Internal Medicine

## 2017-07-15 ENCOUNTER — Ambulatory Visit: Payer: Managed Care, Other (non HMO) | Admitting: Internal Medicine

## 2017-07-15 DIAGNOSIS — F3131 Bipolar disorder, current episode depressed, mild: Secondary | ICD-10-CM

## 2017-07-15 MED ORDER — LAMOTRIGINE 25 MG PO TBDP: 25.0000 mg | ORAL_TABLET | Freq: Every day | ORAL | 2 refills | Status: DC

## 2017-07-15 NOTE — Assessment & Plan Note (Signed)
Deteriorated Support offered today She will continue to follow with her therapist [facility] for Lamictal 25 mg daily  Update me in 3-4 weeks via mychart and let me know how you are doing?

## 2017-07-15 NOTE — Patient Instructions (Signed)
Persistent Depressive Disorder Persistent depressive disorder (PDD) is a mental health condition. PDD causes symptoms of low-level depression for 2 years or longer. It may also be called long-term (chronic) depression or dysthymia. PDD may include episodes of more severe depression that last for about 2 weeks (major depressive disorder or MDD). PDD can affect the way you think, feel, and sleep. This condition may also affect your relationships. You may be more likely to get sick if you have PDD. Symptoms of PDD occur for most of the day and may include:  Feeling tired (fatigue).  Low energy.  Eating too much or too little.  Sleeping too much or too little.  Feeling restless or agitated.  Feeling hopeless.  Feeling worthless or guilty.  Feeling worried or nervous (anxiety).  Trouble concentrating or making decisions.  Low self-esteem.  A negative way of looking at things (outlook).  Not being able to have fun or feel pleasure.  Avoiding interacting with people.  Getting angry or annoyed easily (irritability).  Acting aggressive or angry.  Follow these instructions at home: Activity  Go back to your normal activities as told by your doctor.  Exercise regularly as told by your doctor. General instructions  Take over-the-counter and prescription medicines only as told by your doctor.  Do not drink alcohol. Or, limit how much alcohol you drink to no more than 1 drink a day for nonpregnant women and 2 drinks a day for men. One drink equals 12 oz of beer, 5 oz of wine, or 1 oz of hard liquor. Alcohol can affect any antidepressant medicines you are taking. Talk with your doctor about your alcohol use.  Eat a healthy diet and get plenty of sleep.  Find activities that you enjoy each day.  Consider joining a support group. Your doctor may be able to suggest a support group.  Keep all follow-up visits as told by your doctor. This is important. Where to find more  information: National Alliance on Mental Illness  www.nami.org  U.[place]. National Institute of Mental Health  www.nimh.nih.gov  National Suicide Prevention Lifeline  1-800-273-TALK (1-800-273-8255). This is free, 24-hour help.  Contact a doctor if:  Your symptoms get worse.  You have new symptoms.  You have trouble sleeping or doing your daily activities. Get help right away if:  You self-harm.  You have serious thoughts about hurting yourself or others.  You see, hear, taste, smell, or feel things that are not there (hallucinate). This information is not intended to replace advice given to you by your health care provider. Make sure you discuss any questions you have with your health care provider. Document Released: 03/20/2015 Document Revised: 12/01/2015 Document Reviewed: 12/01/2015 Elsevier Interactive Patient Education  2017 Elsevier Inc.  

## 2017-07-15 NOTE — Progress Notes (Signed)
HPI  Pt presents to the clinic today to establish care and for management of the conditions listed below. She has not had a PCP in many years.  Bipolar Depression: She is getting a divorce from her wife of 8 years. She reports between her 2 full time jobs and the stress of her divorce, it is causing her bipolar depression to flare. She is seeing a therapist who advised her to get back on her Lamictal.  Flu: 12/2006 Tetanus: 08/2005 Pap Smear: 01/2017 Mammogram: 04/2016 Vision Screening: annually Dentist: biannually  Past Medical History:  Diagnosis Date  . PONV (postoperative nausea and vomiting)   . Rubella   . SVT (supraventricular tachycardia) (HCC)     Current Outpatient Medications  Medication Sig Dispense Refill  . albuterol (PROVENTIL HFA;VENTOLIN HFA) 108 (90 Base) MCG/ACT inhaler Inhale 2 puffs into the lungs every 6 (six) hours as needed for wheezing or shortness of breath. 1 Inhaler 12  . fexofenadine-pseudoephedrine (ALLEGRA-D) 60-120 MG 12 hr tablet Take 1 tablet by mouth 2 (two) times daily. 20 tablet 0  . fluticasone (FLOVENT HFA) 220 MCG/ACT inhaler Inhale 2 puffs into the lungs 2 (two) times daily. Rinse mouth with water after using 1 Inhaler 3  . ibuprofen (MOTRIN IB) 200 MG tablet Take 3 tablets (600 mg total) by mouth every 6 (six) hours as needed. 30 tablet 0  . ipratropium-albuterol (DUONEB) 0.5-2.5 (3) MG/3ML SOLN Take 3 mLs by nebulization every 4 (four) hours as needed. 360 mL 1  . valACYclovir (VALTREX) 500 MG tablet Take 500 mg by mouth daily.  12  . lamotrigine (LAMICTAL) 25 MG disintegrating tablet Take 1 tablet (25 mg total) by mouth daily. 30 tablet 2   No current facility-administered medications for this visit.     Allergies  Allergen Reactions  . Sulfonamide Derivatives Nausea And Vomiting  . Latex Itching and Swelling    Family History  Problem Relation Age of Onset  . Cancer Maternal Aunt        thyroid cancer  . Cancer Maternal Uncle   .  Diabetes Paternal Aunt   . Heart disease Neg Hx   . Stroke Neg Hx   . Breast cancer Neg Hx     Social History   Socioeconomic History  . Marital status: Married    Spouse name: Not on file  . Number of children: Not on file  . Years of education: Not on file  . Highest education level: Not on file  Occupational History  . Not on file  Social Needs  . Financial resource strain: Not on file  . Food insecurity:    Worry: Not on file    Inability: Not on file  . Transportation needs:    Medical: Not on file    Non-medical: Not on file  Tobacco Use  . Smoking status: Never Smoker  . Smokeless tobacco: Never Used  Substance and Sexual Activity  . Alcohol use: No  . Drug use: No  . Sexual activity: Yes  Lifestyle  . Physical activity:    Days per week: Not on file    Minutes per session: Not on file  . Stress: Not on file  Relationships  . Social connections:    Talks on phone: Not on file    Gets together: Not on file    Attends religious service: Not on file    Active member of club or organization: Not on file    Attends meetings of clubs or organizations: Not  on file    Relationship status: Not on file  . Intimate partner violence:    Fear of current or ex partner: Not on file    Emotionally abused: Not on file    Physically abused: Not on file    Forced sexual activity: Not on file  Other Topics Concern  . Not on file  Social History Narrative  . Not on file    ROS:  Constitutional: Denies fever, malaise, fatigue, headache or abrupt weight changes.  HEENT: Denies eye pain, eye redness, ear pain, ringing in the ears, wax buildup, runny nose, nasal congestion, bloody nose, or sore throat. Respiratory: Denies difficulty breathing, shortness of breath, cough or sputum production.   Cardiovascular: Denies chest pain, chest tightness, palpitations or swelling in the hands or feet.  Gastrointestinal: Denies abdominal pain, bloating, constipation, diarrhea or blood  in the stool.  GU: Denies frequency, urgency, pain with urination, blood in urine, odor or discharge. Musculoskeletal: Denies decrease in range of motion, difficulty with gait, muscle pain or joint pain and swelling.  Skin: Denies redness, rashes, lesions or ulcercations.  Neurological: Denies dizziness, difficulty with memory, difficulty with speech or problems with balance and coordination.  Psych: Pt reports depression. Denies anxiety, SI/HI.  No other specific complaints in a complete review of systems (except as listed in HPI above).  PE:  BP 106/72   Pulse 72   Temp 98.4 F (36.9 C) (Oral)   Wt 142 lb (64.4 kg)   SpO2 97%   BMI 25.15 kg/m  Wt Readings from Last 3 Encounters:  07/15/17 142 lb (64.4 kg)  02/28/16 149 lb (67.6 kg)  01/15/16 146 lb 12.8 oz (66.6 kg)    General: Appears her stated age, well developed, well nourished in NAD. Skin: Dry and intact. Cardiovascular: Normal rate and rhythm.  Pulmonary/Chest: Normal effort and positive vesicular breath sounds. No respiratory distress. No wheezes, rales or ronchi noted.  Neurological: Alert and oriented.  Psychiatric: Mood and affect normal. Behavior is normal. Judgment and thought content normal.     BMET    Component Value Date/Time   NA 141 06/19/2017 0708   NA 140 02/28/2016 0900   NA 144 07/10/2011 1025   K 3.7 06/19/2017 0708   K 3.6 07/10/2011 1025   CL 108 06/19/2017 0708   CL 108 (H) 07/10/2011 1025   CO2 25 06/19/2017 0708   CO2 24 07/10/2011 1025   GLUCOSE 136 (H) 06/19/2017 0708   GLUCOSE 142 (H) 07/10/2011 1025   BUN 20 06/19/2017 0708   BUN 10 02/28/2016 0900   BUN 10 07/10/2011 1025   CREATININE 0.93 06/19/2017 0708   CREATININE 0.87 07/10/2011 1025   CALCIUM 9.1 06/19/2017 0708   CALCIUM 8.7 07/10/2011 1025   GFRNONAA >60 06/19/2017 0708   GFRNONAA >60 07/10/2011 1025   GFRAA >60 06/19/2017 0708   GFRAA >60 07/10/2011 1025    Lipid Panel     Component Value Date/Time   CHOL 209  (H) 02/28/2016 0900   TRIG 142 02/28/2016 0900   HDL 47 02/28/2016 0900   CHOLHDL 4.4 02/28/2016 0900   CHOLHDL 4 07/13/2013 1122   VLDL 23.6 07/13/2013 1122   LDLCALC 134 (H) 02/28/2016 0900    CBC    Component Value Date/Time   WBC 7.8 06/19/2017 0708   RBC 5.22 (H) 06/19/2017 0708   HGB 15.2 06/19/2017 0708   HGB 14.4 02/28/2016 0900   HCT 44.7 06/19/2017 0708   HCT 42.2 02/28/2016 0900  PLT 228 06/19/2017 0708   PLT 252 02/28/2016 0900   MCV 85.7 06/19/2017 0708   MCV 85 02/28/2016 0900   MCV 88 07/10/2011 1025   MCH 29.1 06/19/2017 0708   MCHC 33.9 06/19/2017 0708   RDW 13.2 06/19/2017 0708   RDW 13.2 02/28/2016 0900   RDW 12.6 07/10/2011 1025   LYMPHSABS 3.4 06/19/2017 0708   LYMPHSABS 3.6 (H) 02/28/2016 0900   MONOABS 0.4 06/19/2017 0708   EOSABS 0.2 06/19/2017 0708   EOSABS 0.3 02/28/2016 0900   BASOSABS 0.1 06/19/2017 0708   BASOSABS 0.1 02/28/2016 0900    Hgb A1C Lab Results  Component Value Date   HGBA1C 5.4 02/28/2016     Assessment and Plan:

## 2017-07-18 ENCOUNTER — Other Ambulatory Visit: Payer: Self-pay | Admitting: Obstetrics and Gynecology

## 2017-10-16 ENCOUNTER — Ambulatory Visit: Payer: Self-pay | Admitting: Family Medicine

## 2017-10-16 VITALS — BP 123/75 | HR 67 | Resp 16 | Ht 64.0 in | Wt 139.0 lb

## 2017-10-16 DIAGNOSIS — Z008 Encounter for other general examination: Secondary | ICD-10-CM

## 2017-10-16 DIAGNOSIS — Z0189 Encounter for other specified special examinations: Principal | ICD-10-CM

## 2017-10-16 NOTE — Progress Notes (Signed)
Subjective: Annual biometrics screening  Patient presents for her annual biometric screening. Patient denies eating a healthy, well-rounded diet or getting regular physical activity.  Patient regularly sees her primary care provider. PCP: Nicki Reaperegina Baity, NP Patient works for [employer]. Patient denies any other issues or concerns.   Review of Systems Unremarkable  Objective  Physical Exam General: Awake, alert and oriented. No acute distress. Well developed, hydrated and nourished. Appears stated age.  HEENT: Supple neck without adenopathy. Sclera is non-icteric. The ear canal is clear without discharge. The tympanic membrane is normal in appearance with normal landmarks and cone of light. Nasal mucosa is pink and moist. Oral mucosa is pink and moist. The pharynx is normal in appearance without tonsillar swelling or exudates.  Skin: Skin in warm, dry and intact without rashes or lesions. Appropriate color for ethnicity. Cardiac: Heart rate and rhythm are normal. No murmurs, gallops, or rubs are auscultated.  Respiratory: The chest wall is symmetric and without deformity. No signs of respiratory distress. Lung sounds are clear in all lobes bilaterally without rales, ronchi, or wheezes.  Neurological: The patient is awake, alert and oriented to person, place, and time with normal speech.  Memory is normal and thought processes intact. No gait abnormalities are appreciated.  Psychiatric: Appropriate mood and affect.   Assessment Annual biometrics screening  Plan  Lipid panel and nonfasting blood sugar pending. Encouraged routine visits with primary care provider.  Encouraged patient to get regular exercise and eat a healthy, well-rounded diet.

## 2017-10-17 LAB — LIPID PANEL
Chol/HDL Ratio: 5 ratio — ABNORMAL HIGH (ref 0.0–4.4)
Cholesterol, Total: 209 mg/dL — ABNORMAL HIGH (ref 100–199)
HDL: 42 mg/dL (ref >39)
LDL Calculated: 95 mg/dL (ref 0–99)
Triglycerides: 358 mg/dL — ABNORMAL HIGH (ref 0–149)
VLDL Cholesterol Cal: 72 mg/dL — ABNORMAL HIGH (ref 5–40)

## 2017-10-17 LAB — GLUCOSE, RANDOM: Glucose: 109 mg/dL — ABNORMAL HIGH (ref 65–99)

## 2017-10-20 NOTE — Progress Notes (Signed)
Dear Tasha Miller, I wanted to let you know that your lipid panel and nonfasting blood sugar came back.  Everything is normal, with the exception of your total cholesterol, triglyceride level, VLDL cholesterol, and cholesterol/HDL ratio.  Your total cholesterol is elevated at 209, normal values are between 100 and 199.  Your triglyceride level is elevated at 358, normal values are between 0 and 149.  Your VLDL cholesterol is elevated at 72, normal values are between 5 and 40. Your cholesterol/HDL ratio is elevated at 5.0, normal values are between 0 and 4.4 for women or 0 and 5 from men.  These abnormal values increase your risk for cardiovascular disease.  I want you to follow-up with your primary care provider within the next week regarding these results.

## 2017-12-03 ENCOUNTER — Other Ambulatory Visit: Payer: Self-pay | Admitting: Internal Medicine

## 2017-12-04 NOTE — Telephone Encounter (Signed)
Can you call and ask her if she has been taking it? She should be out.

## 2017-12-04 NOTE — Telephone Encounter (Signed)
Last filled 07/15/17 with 2 refills... Pt never f/u with how she was doing.... Please advise if okay to refill

## 2017-12-05 NOTE — Telephone Encounter (Signed)
Left detailed msg on VM per HIPAA  

## 2017-12-10 ENCOUNTER — Other Ambulatory Visit: Payer: Self-pay | Admitting: Obstetrics and Gynecology

## 2018-05-06 ENCOUNTER — Ambulatory Visit
Admission: RE | Admit: 2018-05-06 | Discharge: 2018-05-06 | Disposition: A | Discharge status: Home / Self Care | Payer: Managed Care, Other (non HMO) | Source: Ambulatory Visit | Attending: Neurology | Admitting: Neurology

## 2018-05-06 ENCOUNTER — Ambulatory Visit
Admission: RE | Admit: 2018-05-06 | Discharge: 2018-05-06 | Disposition: A | Discharge status: Home / Self Care | Payer: Managed Care, Other (non HMO) | Source: Ambulatory Visit | Attending: Emergency Medicine | Admitting: Emergency Medicine

## 2018-05-06 ENCOUNTER — Ambulatory Visit: Payer: Self-pay | Admitting: Emergency Medicine

## 2018-05-06 ENCOUNTER — Encounter: Payer: Self-pay | Admitting: Emergency Medicine

## 2018-05-06 VITALS — BP 120/78 | HR 88 | Temp 98.2°F | Resp 14 | Ht 63.0 in | Wt 140.0 lb

## 2018-05-06 DIAGNOSIS — R091 Pleurisy: Secondary | ICD-10-CM | POA: Diagnosis present

## 2018-05-06 DIAGNOSIS — R0789 Other chest pain: Secondary | ICD-10-CM | POA: Diagnosis not present

## 2018-05-06 MED ORDER — FLUTICASONE PROPIONATE HFA 220 MCG/ACT IN AERO: 2.0000 | INHALATION_SPRAY | Freq: Two times a day (BID) | RESPIRATORY_TRACT | 0 refills | Status: DC

## 2018-05-06 NOTE — Progress Notes (Addendum)
[facility]   Patient ID: Tasha Miller DOB: 48 y.o. MRN: [id number]   Subjective: Chief complaint is pleuritic sharp and sore left anterior chest pain, mild to moderate intensity, worse to take a deep breath or to press on it.  No definite radiation.  No associated shortness of breath or lightheadedness or palpitations or syncope. She feels this might have been from recent asthmatic bronchitis flareup and coughing.  Wheezing and coughing have since resolved after she was managed online "[facility]", took 5 days of Levaquin and this is fifth of 6 days of prednisone 60 mg daily.  No longer has any fever or chills.  Wheezing has resolved.  Not needing albuterol rescue inhaler now, but she requests a refill of Flovent HFA to use for the next few weeks until she can follow-up with her PCP.  She has DuoNeb and nebulizer at home, but has not needed to use this. After further questioning, she states the left chest discomfort may have been from excessive physical activity involving upper body and left shoulder, couple days ago. Denies breast discharge or bleeding.  Denies feeling breast lump. Tried ibuprofen for the chest pain and that helped a little.  Tried heating pad and that helped a little also. Patient states she is concerned about ruling out pneumothorax   Social history: Never smoked.  (she works in ED)   Past medical history: Has seen cardiologist, Dr. Dorothyann Peng at [facility] for PVCs and SVT in the past.  Last seen there February 2019.  Last EKG that I can find in records 04/30/2017 was normal sinus rhythm.  Negative work-up for any significant abnormality.  She was briefly tried on antiarrhythmic, did not tolerate it because of side effects, and DC'd antiarrhythmic, and palpitations have since resolved. The last chest x-ray I could find in EMR, normal chest x-ray May 17, 2016. Has had history of asthmatic bronchitis but not on Flovent  chronically but uses that for many weeks at a time after flareup. Prior diagnosis of bipolar affective disorder and anxiety. Her new PCP is Nicki Reaper, NP, patient states will be following up there in the next few weeks.  Allergies  Allergen Reactions  . Sulfonamide Derivatives Nausea And Vomiting  . Latex Itching and Swelling   ROS: Currently, no fever or chills.  No wheezing.  No nausea or vomiting or abdominal pain.  Denies palpitations.  No exertional chest pain Pertinent items noted in HPI and remainder of comprehensive ROS otherwise negative.  No LMP recorded. Patient has had a hysterectomy.  Objective: Blood pressure 120/78, pulse 88, temperature 98.2 F (36.8 C), temperature source Oral, resp. rate 14, height 5\' 3"  (1.6 m), weight 140 lb (63.5 kg), SpO2 98 %.  Physical Exam Vitals signs reviewed.  Constitutional:      General: She is not in acute distress.    Appearance: She is well-developed.  HENT:     Head: Normocephalic and atraumatic.     Mouth/Throat:     Mouth: Mucous membranes are moist.     Pharynx: Oropharynx is clear.  Eyes:     General: No scleral icterus.    Conjunctiva/sclera: Conjunctivae normal.     Pupils: Pupils are equal, round, and reactive to light.  Neck:     Musculoskeletal: Normal range of motion and neck supple.  Cardiovascular:     Rate and Rhythm: Normal rate and regular rhythm.     Pulses: Normal pulses.  Heart sounds: Normal heart sounds. No murmur. No friction rub. No gallop.   Pulmonary:     Effort: Pulmonary effort is normal.     Comments: Uncomfortable when she takes a deep breath.  States pain is left anterior lateral chest wall when taking deep breath. She is tender to palpation left anterior lateral chest wall without mass felt.  No axillary adenopathy. Breath sounds equal bilaterally.  No rhonchi or rales or wheezes heard. Abdominal:     General: There is no distension.  Musculoskeletal:        General: No swelling or  tenderness.     Right lower leg: No edema.     Left lower leg: No edema.  Skin:    General: Skin is warm and dry.     Capillary Refill: Capillary refill takes less than 2 seconds.     Findings: No bruising.  Neurological:     Mental Status: She is alert and oriented to person, place, and time.     Cranial Nerves: No cranial nerve deficit.  Psychiatric:        Behavior: Behavior normal.   Left shoulder exam: Full range of motion.  No bony tenderness left shoulder.  No instability.  Neurovascular distally intact left upper extremity.  When she pushes with left upper extremity, she can feel soreness in the left anterior chest wall muscles She declined breast exam today.  She declined EKG today. Pulse ox today 98% room air.   Assessment: -Likely has left chest wall pain/strain, with tenderness in the region of pain, upon palpation.  No exertional chest pain to suggest cardiac cause.  Less likely pulmonary cause, but pain is pleuritic.-We discussed options and she prefers chest x-ray, and I have ordered a chest x-ray to rule out pneumothorax or other lung problem.  Note she does not have any point tenderness directly over ribs, so rib x-ray not ordered. -Her bout with asthmatic bronchitis has essentially resolved.  No wheezing or symptoms of bronchitis now.  She requested I refill Flovent HFA, and advised I can do small refill, but any further refills would need to be managed by her PCP.  She voiced understanding.  Plan: Treatment options discussed, as well as risks, benefits, alternatives. Patient voiced understanding and agreement with the following plans: For chest wall pain, ibuprofen OTC up to 600 mg 3 times daily PC as needed for pain.  Continue heating pad as that has helped. We are sending her for chest x-ray now (no radiology in this building)-we will call her with results at her cell phone [phone]    New Prescriptions   FLUTICASONE (FLOVENT HFA) 220 MCG/ACT INHALER    Inhale  2 puffs into the lungs 2 (two) times daily. (Any further refills would need to be done by Nicki Reaper, pt's PCP)      Follow-up with your primary care doctor in 5-7 days if not improving, or sooner if symptoms become worse. Precautions discussed. Red flags discussed.-Emergency room if any red flag Questions invited and answered. Patient voiced understanding and agreement.

## 2018-05-07 ENCOUNTER — Telehealth: Payer: Self-pay

## 2018-05-07 NOTE — Telephone Encounter (Signed)
The patient was contacted for her negative Chest Xray. She gave verbal understanding.

## 2018-05-24 ENCOUNTER — Other Ambulatory Visit: Payer: Self-pay | Admitting: Internal Medicine

## 2018-05-24 MED ORDER — LAMOTRIGINE 25 MG PO TABS: 25.0000 mg | ORAL_TABLET | Freq: Every day | ORAL | 5 refills | Status: DC

## 2018-06-25 ENCOUNTER — Ambulatory Visit: Payer: Self-pay | Admitting: Internal Medicine

## 2018-06-26 ENCOUNTER — Encounter: Payer: Self-pay | Admitting: Internal Medicine

## 2018-06-26 ENCOUNTER — Ambulatory Visit: Payer: Managed Care, Other (non HMO) | Admitting: Internal Medicine

## 2018-06-26 VITALS — BP 122/68 | HR 90 | Temp 97.9°F | Wt 144.0 lb

## 2018-06-26 DIAGNOSIS — F3131 Bipolar disorder, current episode depressed, mild: Secondary | ICD-10-CM

## 2018-06-26 DIAGNOSIS — E781 Pure hyperglyceridemia: Secondary | ICD-10-CM | POA: Diagnosis not present

## 2018-06-26 MED ORDER — LAMOTRIGINE 25 MG PO TABS: 100.0000 mg | ORAL_TABLET | Freq: Every day | ORAL | 5 refills | Status: DC

## 2018-06-26 NOTE — Patient Instructions (Signed)
Fat and Cholesterol Restricted Eating Plan Getting too much fat and cholesterol in your diet may cause health problems. Choosing the right foods helps keep your fat and cholesterol at normal levels. This can keep you from getting certain diseases. Your doctor may recommend an eating plan that includes:  Total fat: ______% or less of total calories a day.  Saturated fat: ______% or less of total calories a day.  Cholesterol: less than _________mg a day.  Fiber: ______g a day. What are tips for following this plan? Meal planning  At meals, divide your plate into four equal parts: ? Fill one-half of your plate with vegetables and green salads. ? Fill one-fourth of your plate with whole grains. ? Fill one-fourth of your plate with low-fat (lean) protein foods.  Eat fish that is high in omega-3 fats at least two times a week. This includes mackerel, tuna, sardines, and salmon.  Eat foods that are high in fiber, such as whole grains, beans, apples, broccoli, carrots, peas, and barley. General tips   Work with your doctor to lose weight if you need to.  Avoid: ? Foods with added sugar. ? Fried foods. ? Foods with partially hydrogenated oils.  Limit alcohol intake to no more than 1 drink a day for nonpregnant women and 2 drinks a day for men. One drink equals 12 oz of beer, 5 oz of wine, or 1 oz of hard liquor. Reading food labels  Check food labels for: ? Trans fats. ? Partially hydrogenated oils. ? Saturated fat (g) in each serving. ? Cholesterol (mg) in each serving. ? Fiber (g) in each serving.  Choose foods with healthy fats, such as: ? Monounsaturated fats. ? Polyunsaturated fats. ? Omega-3 fats.  Choose grain products that have whole grains. Look for the word "whole" as the first word in the ingredient list. Cooking  Cook foods using low-fat methods. These include baking, boiling, grilling, and broiling.  Eat more home-cooked foods. Eat at restaurants and buffets  less often.  Avoid cooking using saturated fats, such as butter, cream, palm oil, palm kernel oil, and coconut oil. Recommended foods  Fruits  All fresh, canned (in natural juice), or frozen fruits. Vegetables  Fresh or frozen vegetables (raw, steamed, roasted, or grilled). Green salads. Grains  Whole grains, such as whole wheat or whole grain breads, crackers, cereals, and pasta. Unsweetened oatmeal, bulgur, barley, quinoa, or brown rice. Corn or whole wheat flour tortillas. Meats and other protein foods  Ground beef (85% or leaner), grass-fed beef, or beef trimmed of fat. Skinless chicken or turkey. Ground chicken or turkey. Pork trimmed of fat. All fish and seafood. Egg whites. Dried beans, peas, or lentils. Unsalted nuts or seeds. Unsalted canned beans. Nut butters without added sugar or oil. Dairy  Low-fat or nonfat dairy products, such as skim or 1% milk, 2% or reduced-fat cheeses, low-fat and fat-free ricotta or cottage cheese, or plain low-fat and nonfat yogurt. Fats and oils  Tub margarine without trans fats. Light or reduced-fat mayonnaise and salad dressings. Avocado. Olive, canola, sesame, or safflower oils. The items listed above may not be a complete list of foods and beverages you can eat. Contact a dietitian for more information. Foods to avoid Fruits  Canned fruit in heavy syrup. Fruit in cream or butter sauce. Fried fruit. Vegetables  Vegetables cooked in cheese, cream, or butter sauce. Fried vegetables. Grains  White bread. White pasta. White rice. Cornbread. Bagels, pastries, and croissants. Crackers and snack foods that contain trans fat   and hydrogenated oils. Meats and other protein foods  Fatty cuts of meat. Ribs, chicken wings, bacon, sausage, bologna, salami, chitterlings, fatback, hot dogs, bratwurst, and packaged lunch meats. Liver and organ meats. Whole eggs and egg yolks. Chicken and turkey with skin. Fried meat. Dairy  Whole or 2% milk, cream,  half-and-half, and cream cheese. Whole milk cheeses. Whole-fat or sweetened yogurt. Full-fat cheeses. Nondairy creamers and whipped toppings. Processed cheese, cheese spreads, and cheese curds. Beverages  Alcohol. Sugar-sweetened drinks such as sodas, lemonade, and fruit drinks. Fats and oils  Butter, stick margarine, lard, shortening, ghee, or bacon fat. Coconut, palm kernel, and palm oils. Sweets and desserts  Corn syrup, sugars, honey, and molasses. Candy. Jam and jelly. Syrup. Sweetened cereals. Cookies, pies, cakes, donuts, muffins, and ice cream. The items listed above may not be a complete list of foods and beverages you should avoid. Contact a dietitian for more information. Summary  Choosing the right foods helps keep your fat and cholesterol at normal levels. This can keep you from getting certain diseases.  At meals, fill one-half of your plate with vegetables and green salads.  Eat high-fiber foods, like whole grains, beans, apples, carrots, peas, and barley.  Limit added sugar, saturated fats, alcohol, and fried foods. This information is not intended to replace advice given to you by your health care provider. Make sure you discuss any questions you have with your health care provider. Document Released: 10/08/2011 Document Revised: 12/10/2017 Document Reviewed: 12/24/2016 Elsevier Interactive Patient Education  2019 Elsevier Inc.   

## 2018-06-26 NOTE — Progress Notes (Signed)
Subjective:    Patient ID: Tasha Miller, female    DOB: Apr 05, 1971, 48 y.o.   MRN: [id number]  HPI  Pt presents to the clinic today to follow up chronic conditions.  Bipolar Disorder: She was recently started on Lamictal 25 mg QHS. She has increased her dose to 50 mg. She has an appt with a therapist on Monday. She is under a lot of stress right now. She would like a refill of Lamictal today   Review of Systems  Past Medical History:  Diagnosis Date  . PONV (postoperative nausea and vomiting)   . Rubella   . SVT (supraventricular tachycardia) (HCC)     Current Outpatient Medications  Medication Sig Dispense Refill  . albuterol (PROVENTIL HFA;VENTOLIN HFA) 108 (90 Base) MCG/ACT inhaler Inhale 2 puffs into the lungs every 6 (six) hours as needed for wheezing or shortness of breath. (Patient taking differently: Inhale 2 puffs into the lungs as needed for wheezing or shortness of breath. ) 1 Inhaler 12  . cyanocobalamin (,VITAMIN B-12,) 1000 MCG/ML injection Inject 1,000 mcg into the muscle every 14 (fourteen) days.    . fluticasone (FLOVENT HFA) 220 MCG/ACT inhaler Inhale 2 puffs into the lungs 2 (two) times daily. (Any further refills would need to be done by Nicki Reaper, pt's PCP) 1 Inhaler 0  . ibuprofen (MOTRIN IB) 200 MG tablet Take 3 tablets (600 mg total) by mouth every 6 (six) hours as needed. 30 tablet 0  . ipratropium-albuterol (DUONEB) 0.5-2.5 (3) MG/3ML SOLN Take 3 mLs by nebulization every 4 (four) hours as needed. (Patient taking differently: Take 3 mLs by nebulization as needed. ) 360 mL 1  . NONFORMULARY OR COMPOUNDED ITEM Depo Testadiol 50/2    . lamoTRIgine (LAMICTAL) 25 MG tablet Take 4 tablets (100 mg total) by mouth daily. 120 tablet 5   No current facility-administered medications for this visit.     Allergies  Allergen Reactions  . Sulfonamide Derivatives Nausea And Vomiting  . Latex Itching and Swelling    Family History  Problem Relation Age of Onset    . Cancer Maternal Aunt        thyroid cancer  . Cancer Maternal Uncle   . Diabetes Paternal Aunt   . Heart disease Neg Hx   . Stroke Neg Hx   . Breast cancer Neg Hx     Social History   Socioeconomic History  . Marital status: Married    Spouse name: Not on file  . Number of children: Not on file  . Years of education: Not on file  . Highest education level: Not on file  Occupational History  . Not on file  Social Needs  . Financial resource strain: Not on file  . Food insecurity:    Worry: Not on file    Inability: Not on file  . Transportation needs:    Medical: Not on file    Non-medical: Not on file  Tobacco Use  . Smoking status: Never Smoker  . Smokeless tobacco: Never Used  Substance and Sexual Activity  . Alcohol use: No  . Drug use: No  . Sexual activity: Yes  Lifestyle  . Physical activity:    Days per week: Not on file    Minutes per session: Not on file  . Stress: Not on file  Relationships  . Social connections:    Talks on phone: Not on file    Gets together: Not on file    Attends religious service:  Not on file    Active member of club or organization: Not on file    Attends meetings of clubs or organizations: Not on file    Relationship status: Not on file  . Intimate partner violence:    Fear of current or ex partner: Not on file    Emotionally abused: Not on file    Physically abused: Not on file    Forced sexual activity: Not on file  Other Topics Concern  . Not on file  Social History Narrative  . Not on file     Constitutional: Denies fever, malaise, fatigue, headache or abrupt weight changes.  Respiratory: Denies difficulty breathing, shortness of breath, cough or sputum production.   Cardiovascular: Denies chest pain, chest tightness, palpitations or swelling in the hands or feet.  Neurological: Denies dizziness, difficulty with memory, difficulty with speech or problems with balance and coordination.  Psych: Pt reports  depression. Denies SI/HI.  No other specific complaints in a complete review of systems (except as listed in HPI above).     Objective:   Physical Exam   BP 122/68   Pulse 90   Temp 97.9 F (36.6 C) (Oral)   Wt 144 lb (65.3 kg)   SpO2 97%   BMI 25.51 kg/m  Wt Readings from Last 3 Encounters:  06/26/18 144 lb (65.3 kg)  05/06/18 140 lb (63.5 kg)  10/16/17 139 lb (63 kg)    General: Appears her stated age, well developed, well nourished in NAD. Cardiovascular: Normal rate and rhythm. S1,S2 noted.  No murmur, rubs or gallops noted.  Pulmonary/Chest: Normal effort and positive vesicular breath sounds. No respiratory distress. No wheezes, rales or ronchi noted.  Neurological: Alert and oriented.  Psychiatric: Mood and affect flat Behavior is normal. Judgment and thought content normal.     BMET    Component Value Date/Time   NA 141 06/19/2017 0708   NA 140 02/28/2016 0900   NA 144 07/10/2011 1025   K 3.7 06/19/2017 0708   K 3.6 07/10/2011 1025   CL 108 06/19/2017 0708   CL 108 (H) 07/10/2011 1025   CO2 25 06/19/2017 0708   CO2 24 07/10/2011 1025   GLUCOSE 109 (H) 10/16/2017 1438   GLUCOSE 136 (H) 06/19/2017 0708   GLUCOSE 142 (H) 07/10/2011 1025   BUN 20 06/19/2017 0708   BUN 10 02/28/2016 0900   BUN 10 07/10/2011 1025   CREATININE 0.93 06/19/2017 0708   CREATININE 0.87 07/10/2011 1025   CALCIUM 9.1 06/19/2017 0708   CALCIUM 8.7 07/10/2011 1025   GFRNONAA >60 06/19/2017 0708   GFRNONAA >60 07/10/2011 1025   GFRAA >60 06/19/2017 0708   GFRAA >60 07/10/2011 1025    Lipid Panel     Component Value Date/Time   CHOL 209 (H) 10/16/2017 1438   TRIG 358 (H) 10/16/2017 1438   HDL 42 10/16/2017 1438   CHOLHDL 5.0 (H) 10/16/2017 1438   CHOLHDL 4 07/13/2013 1122   VLDL 23.6 07/13/2013 1122   LDLCALC 95 10/16/2017 1438    CBC    Component Value Date/Time   WBC 7.8 06/19/2017 0708   RBC 5.22 (H) 06/19/2017 0708   HGB 15.2 06/19/2017 0708   HGB 14.4 02/28/2016  0900   HCT 44.7 06/19/2017 0708   HCT 42.2 02/28/2016 0900   PLT 228 06/19/2017 0708   PLT 252 02/28/2016 0900   MCV 85.7 06/19/2017 0708   MCV 85 02/28/2016 0900   MCV 88 07/10/2011 1025   MCH  29.1 06/19/2017 0708   MCHC 33.9 06/19/2017 0708   RDW 13.2 06/19/2017 0708   RDW 13.2 02/28/2016 0900   RDW 12.6 07/10/2011 1025   LYMPHSABS 3.4 06/19/2017 0708   LYMPHSABS 3.6 (H) 02/28/2016 0900   MONOABS 0.4 06/19/2017 0708   EOSABS 0.2 06/19/2017 0708   EOSABS 0.3 02/28/2016 0900   BASOSABS 0.1 06/19/2017 0708   BASOSABS 0.1 02/28/2016 0900    Hgb A1C Lab Results  Component Value Date   HGBA1C 5.4 02/28/2016           Assessment & Plan:

## 2018-06-26 NOTE — Assessment & Plan Note (Signed)
Lipid profile today Encouraged her to consume a low fat diet 

## 2018-06-26 NOTE — Assessment & Plan Note (Signed)
Currently active She has an appt with a therapist on Monday Discussed increased her Lamictal to 100 mg PO QS, refilled today Support offered

## 2018-06-27 LAB — CBC
HCT: 45.5 % — ABNORMAL HIGH (ref 35.0–45.0)
Hemoglobin: 15.6 g/dL — ABNORMAL HIGH (ref 11.7–15.5)
MCH: 29.1 pg (ref 27.0–33.0)
MCHC: 34.3 g/dL (ref 32.0–36.0)
MCV: 84.9 fL (ref 80.0–100.0)
MPV: 12.2 fL (ref 7.5–12.5)
Platelets: 260 10*3/uL (ref 140–400)
RBC: 5.36 10*6/uL — ABNORMAL HIGH (ref 3.80–5.10)
RDW: 12.7 % (ref 11.0–15.0)
WBC: 8 10*3/uL (ref 3.8–10.8)

## 2018-06-27 LAB — COMPREHENSIVE METABOLIC PANEL
AG Ratio: 1.8 (calc) (ref 1.0–2.5)
ALT: 17 U/L (ref 6–29)
AST: 17 U/L (ref 10–35)
Albumin: 4.4 g/dL (ref 3.6–5.1)
Alkaline phosphatase (APISO): 59 U/L (ref 31–125)
BUN: 14 mg/dL (ref 7–25)
CO2: 26 mmol/L (ref 20–32)
Calcium: 9.4 mg/dL (ref 8.6–10.2)
Chloride: 106 mmol/L (ref 98–110)
Creat: 1 mg/dL (ref 0.50–1.10)
Globulin: 2.5 g/dL (calc) (ref 1.9–3.7)
Glucose, Bld: 138 mg/dL — ABNORMAL HIGH (ref 65–99)
Potassium: 4.6 mmol/L (ref 3.5–5.3)
Sodium: 142 mmol/L (ref 135–146)
Total Bilirubin: 0.4 mg/dL (ref 0.2–1.2)
Total Protein: 6.9 g/dL (ref 6.1–8.1)

## 2018-06-27 LAB — LIPID PANEL
Cholesterol: 206 mg/dL — ABNORMAL HIGH (ref <200)
HDL: 42 mg/dL — ABNORMAL LOW (ref >=50)
Non-HDL Cholesterol (Calc): 164 mg/dL (calc) — ABNORMAL HIGH (ref <130)
Total CHOL/HDL Ratio: 4.9 (calc) (ref <5.0)
Triglycerides: 414 mg/dL — ABNORMAL HIGH (ref <150)

## 2018-06-28 ENCOUNTER — Other Ambulatory Visit: Payer: Self-pay

## 2018-06-28 ENCOUNTER — Emergency Department: Payer: No Typology Code available for payment source

## 2018-06-28 ENCOUNTER — Encounter: Payer: Self-pay | Admitting: Emergency Medicine

## 2018-06-28 ENCOUNTER — Emergency Department
Admission: EM | Admit: 2018-06-28 | Discharge: 2018-06-28 | Disposition: A | Discharge status: Home / Self Care | Payer: No Typology Code available for payment source | Attending: Emergency Medicine | Admitting: Emergency Medicine

## 2018-06-28 DIAGNOSIS — Y9389 Activity, other specified: Secondary | ICD-10-CM | POA: Insufficient documentation

## 2018-06-28 DIAGNOSIS — Z23 Encounter for immunization: Secondary | ICD-10-CM | POA: Diagnosis not present

## 2018-06-28 DIAGNOSIS — X509XXA Other and unspecified overexertion or strenuous movements or postures, initial encounter: Secondary | ICD-10-CM | POA: Diagnosis not present

## 2018-06-28 DIAGNOSIS — Y990 Civilian activity done for income or pay: Secondary | ICD-10-CM | POA: Insufficient documentation

## 2018-06-28 DIAGNOSIS — Y929 Unspecified place or not applicable: Secondary | ICD-10-CM | POA: Diagnosis not present

## 2018-06-28 DIAGNOSIS — S6992XA Unspecified injury of left wrist, hand and finger(s), initial encounter: Secondary | ICD-10-CM | POA: Insufficient documentation

## 2018-06-28 DIAGNOSIS — Z79899 Other long term (current) drug therapy: Secondary | ICD-10-CM | POA: Diagnosis not present

## 2018-06-28 MED ORDER — TRAMADOL HCL 50 MG PO TABS
50.0000 mg | ORAL_TABLET | Freq: Once | ORAL | Status: AC
  Administered 2018-06-28: 50 mg via ORAL
  Filled 2018-06-28: qty 1

## 2018-06-28 MED ORDER — TRAMADOL HCL 50 MG PO TABS: 50.0000 mg | ORAL_TABLET | Freq: Four times a day (QID) | ORAL | 0 refills | Status: DC | PRN

## 2018-06-28 MED ORDER — MELOXICAM 15 MG PO TABS: 15.0000 mg | ORAL_TABLET | Freq: Every day | ORAL | 0 refills | Status: DC

## 2018-06-28 MED ORDER — MELOXICAM 7.5 MG PO TABS
15.0000 mg | ORAL_TABLET | Freq: Once | ORAL | Status: AC
  Administered 2018-06-28: 15 mg via ORAL
  Filled 2018-06-28: qty 2

## 2018-06-28 MED ORDER — TETANUS-DIPHTH-ACELL PERTUSSIS 5-2.5-18.5 LF-MCG/0.5 IM SUSP
0.5000 mL | Freq: Once | INTRAMUSCULAR | Status: AC
  Administered 2018-06-28: 0.5 mL via INTRAMUSCULAR
  Filled 2018-06-28: qty 0.5

## 2018-06-28 NOTE — ED Notes (Signed)
[address] 

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
## 2018-07-08 DIAGNOSIS — M79642 Pain in left hand: Secondary | ICD-10-CM | POA: Insufficient documentation

## 2018-07-10 ENCOUNTER — Encounter: Payer: Self-pay | Admitting: Internal Medicine

## 2018-07-10 ENCOUNTER — Other Ambulatory Visit: Payer: Self-pay | Admitting: Obstetrics and Gynecology

## 2018-07-10 DIAGNOSIS — Z1239 Encounter for other screening for malignant neoplasm of breast: Secondary | ICD-10-CM

## 2018-07-13 ENCOUNTER — Other Ambulatory Visit: Payer: Self-pay | Admitting: Internal Medicine

## 2018-07-13 DIAGNOSIS — Z1231 Encounter for screening mammogram for malignant neoplasm of breast: Secondary | ICD-10-CM

## 2018-07-13 DIAGNOSIS — N644 Mastodynia: Secondary | ICD-10-CM

## 2018-07-20 ENCOUNTER — Ambulatory Visit
Admission: RE | Admit: 2018-07-20 | Discharge: 2018-07-20 | Disposition: A | Discharge status: Home / Self Care | Payer: Managed Care, Other (non HMO) | Source: Ambulatory Visit | Attending: Internal Medicine | Admitting: Internal Medicine

## 2018-07-20 ENCOUNTER — Other Ambulatory Visit: Payer: Self-pay

## 2018-07-20 DIAGNOSIS — N644 Mastodynia: Secondary | ICD-10-CM | POA: Insufficient documentation

## 2018-07-20 DIAGNOSIS — Z1231 Encounter for screening mammogram for malignant neoplasm of breast: Secondary | ICD-10-CM | POA: Diagnosis present

## 2018-07-22 ENCOUNTER — Encounter: Payer: Self-pay | Admitting: Internal Medicine

## 2018-07-23 ENCOUNTER — Other Ambulatory Visit: Payer: Self-pay

## 2018-07-23 ENCOUNTER — Ambulatory Visit (INDEPENDENT_AMBULATORY_CARE_PROVIDER_SITE_OTHER): Payer: Managed Care, Other (non HMO) | Admitting: Internal Medicine

## 2018-07-23 ENCOUNTER — Encounter: Payer: Self-pay | Admitting: Internal Medicine

## 2018-07-23 VITALS — BP 124/76 | HR 85 | Temp 98.1°F | Ht 63.0 in | Wt 142.5 lb

## 2018-07-23 DIAGNOSIS — R300 Dysuria: Secondary | ICD-10-CM

## 2018-07-23 DIAGNOSIS — R3915 Urgency of urination: Secondary | ICD-10-CM

## 2018-07-23 DIAGNOSIS — R310 Gross hematuria: Secondary | ICD-10-CM | POA: Diagnosis not present

## 2018-07-23 DIAGNOSIS — L255 Unspecified contact dermatitis due to plants, except food: Secondary | ICD-10-CM

## 2018-07-23 LAB — POC URINALSYSI DIPSTICK (AUTOMATED)
Bilirubin, UA: NEGATIVE
Glucose, UA: NEGATIVE
Ketones, UA: NEGATIVE
Leukocytes, UA: NEGATIVE
Nitrite, UA: NEGATIVE
Protein, UA: NEGATIVE
Spec Grav, UA: >=1.03 — AB (ref 1.010–1.025)
Urobilinogen, UA: 0.2 E.U./dL
pH, UA: 6 (ref 5.0–8.0)

## 2018-07-23 MED ORDER — NITROFURANTOIN MONOHYD MACRO 100 MG PO CAPS: 100.0000 mg | ORAL_CAPSULE | Freq: Two times a day (BID) | ORAL | 0 refills | Status: DC

## 2018-07-23 MED ORDER — PREDNISONE 10 MG PO TABS: ORAL_TABLET | ORAL | 0 refills | Status: DC

## 2018-07-23 NOTE — Addendum Note (Signed)
Addended by: Tawnya Crook on: 07/23/2018 08:56 AM   Modules accepted: Orders

## 2018-07-23 NOTE — Patient Instructions (Signed)
Urinary Tract Infection, Adult A urinary tract infection (UTI) is an infection of any part of the urinary tract. The urinary tract includes:  The kidneys.  The ureters.  The bladder.  The urethra. These organs make, store, and get rid of pee (urine) in the body. What are the causes? This is caused by germs (bacteria) in your genital area. These germs grow and cause swelling (inflammation) of your urinary tract. What increases the risk? You are more likely to develop this condition if:  You have a small, thin tube (catheter) to drain pee.  You cannot control when you pee or poop (incontinence).  You are female, and: ? You use these methods to prevent pregnancy: ? A medicine that kills sperm (spermicide). ? A device that blocks sperm (diaphragm). ? You have low levels of a female hormone (estrogen). ? You are pregnant.  You have genes that add to your risk.  You are sexually active.  You take antibiotic medicines.  You have trouble peeing because of: ? A prostate that is bigger than normal, if you are female. ? A blockage in the part of your body that drains pee from the bladder (urethra). ? A kidney stone. ? A nerve condition that affects your bladder (neurogenic bladder). ? Not getting enough to drink. ? Not peeing often enough.  You have other conditions, such as: ? Diabetes. ? A weak disease-fighting system (immune system). ? Sickle cell disease. ? Gout. ? Injury of the spine. What are the signs or symptoms? Symptoms of this condition include:  Needing to pee right away (urgently).  Peeing often.  Peeing small amounts often.  Pain or burning when peeing.  Blood in the pee.  Pee that smells bad or not like normal.  Trouble peeing.  Pee that is cloudy.  Fluid coming from the vagina, if you are female.  Pain in the belly or lower back. Other symptoms include:  Throwing up (vomiting).  No urge to eat.  Feeling mixed up (confused).  Being tired  and grouchy (irritable).  A fever.  Watery poop (diarrhea). How is this treated? This condition may be treated with:  Antibiotic medicine.  Other medicines.  Drinking enough water. Follow these instructions at home:  Medicines  Take over-the-counter and prescription medicines only as told by your doctor.  If you were prescribed an antibiotic medicine, take it as told by your doctor. Do not stop taking it even if you start to feel better. General instructions  Make sure you: ? Pee until your bladder is empty. ? Do not hold pee for a long time. ? Empty your bladder after sex. ? Wipe from front to back after pooping if you are a female. Use each tissue one time when you wipe.  Drink enough fluid to keep your pee pale yellow.  Keep all follow-up visits as told by your doctor. This is important. Contact a doctor if:  You do not get better after 1-2 days.  Your symptoms go away and then come back. Get help right away if:  You have very bad back pain.  You have very bad pain in your lower belly.  You have a fever.  You are sick to your stomach (nauseous).  You are throwing up. Summary  A urinary tract infection (UTI) is an infection of any part of the urinary tract.  This condition is caused by germs in your genital area.  There are many risk factors for a UTI. These include having a small, thin   tube to drain pee and not being able to control when you pee or poop.  Treatment includes antibiotic medicines for germs.  Drink enough fluid to keep your pee pale yellow. This information is not intended to replace advice given to you by your health care provider. Make sure you discuss any questions you have with your health care provider. Document Released: 09/25/2007 Document Revised: 10/16/2017 Document Reviewed: 10/16/2017 Elsevier Interactive Patient Education  2019 Elsevier Inc.  

## 2018-07-23 NOTE — Progress Notes (Signed)
Subjective:    Patient ID: Tasha Miller, female    DOB: December 24, 1970, 48 y.o.   MRN: [id number]  HPI  Pt presents to the clinic today with c/o urinary urgency, dysuria and blood in her urine. This started Sunday. She denies frequency, low back pain, fever, chills or nausea. She is postmenopausal. She has a history of kidney stones but reports this feels diffrent. She denies vaginal discharge, irritation, odor or abnormal bleeding. She is sexually active with female partners. She has not tried anything OTC for this.  She also c/o a rash. The rash is located on both her upper arms. The rash itches and burns. It has not spread. She noticed this Monday after working out in the yard, cutting down trees on Sunday. She thinks it is poison ivy, which she has had multiple times in the past. She has not come in contact with anything that she is allergic to. She has tried Hydrocortisone cream with minimal relief.   Review of Systems  Past Medical History:  Diagnosis Date  . PONV (postoperative nausea and vomiting)   . Rubella   . SVT (supraventricular tachycardia) (HCC)     Current Outpatient Medications  Medication Sig Dispense Refill  . albuterol (PROVENTIL HFA;VENTOLIN HFA) 108 (90 Base) MCG/ACT inhaler Inhale 2 puffs into the lungs every 6 (six) hours as needed for wheezing or shortness of breath. (Patient taking differently: Inhale 2 puffs into the lungs as needed for wheezing or shortness of breath. ) 1 Inhaler 12  . cyanocobalamin (,VITAMIN B-12,) 1000 MCG/ML injection Inject 1,000 mcg into the muscle every 14 (fourteen) days.    . fluticasone (FLOVENT HFA) 220 MCG/ACT inhaler Inhale 2 puffs into the lungs 2 (two) times daily. (Any further refills would need to be done by Nicki Reaper, pt's PCP) 1 Inhaler 0  . ibuprofen (MOTRIN IB) 200 MG tablet Take 3 tablets (600 mg total) by mouth every 6 (six) hours as needed. 30 tablet 0  . ipratropium-albuterol (DUONEB) 0.5-2.5 (3) MG/3ML SOLN Take 3 mLs  by nebulization every 4 (four) hours as needed. (Patient taking differently: Take 3 mLs by nebulization as needed. ) 360 mL 1  . lamoTRIgine (LAMICTAL) 25 MG tablet Take 4 tablets (100 mg total) by mouth daily. 120 tablet 5  . meloxicam (MOBIC) 15 MG tablet Take 1 tablet (15 mg total) by mouth daily. 30 tablet 0  . NONFORMULARY OR COMPOUNDED ITEM Depo Testadiol 50/2    . traMADol (ULTRAM) 50 MG tablet Take 1 tablet (50 mg total) by mouth every 6 (six) hours as needed. 15 tablet 0   No current facility-administered medications for this visit.     Allergies  Allergen Reactions  . Sulfonamide Derivatives Nausea And Vomiting  . Latex Itching and Swelling    Family History  Problem Relation Age of Onset  . Cancer Maternal Aunt        thyroid cancer  . Cancer Maternal Uncle   . Diabetes Paternal Aunt   . Heart disease Neg Hx   . Stroke Neg Hx   . Breast cancer Neg Hx     Social History   Socioeconomic History  . Marital status: Married    Spouse name: Not on file  . Number of children: Not on file  . Years of education: Not on file  . Highest education level: Not on file  Occupational History  . Not on file  Social Needs  . Financial resource strain: Not on file  .  Food insecurity:    Worry: Not on file    Inability: Not on file  . Transportation needs:    Medical: Not on file    Non-medical: Not on file  Tobacco Use  . Smoking status: Never Smoker  . Smokeless tobacco: Never Used  Substance and Sexual Activity  . Alcohol use: No  . Drug use: No  . Sexual activity: Yes  Lifestyle  . Physical activity:    Days per week: Not on file    Minutes per session: Not on file  . Stress: Not on file  Relationships  . Social connections:    Talks on phone: Not on file    Gets together: Not on file    Attends religious service: Not on file    Active member of club or organization: Not on file    Attends meetings of clubs or organizations: Not on file    Relationship  status: Not on file  . Intimate partner violence:    Fear of current or ex partner: Not on file    Emotionally abused: Not on file    Physically abused: Not on file    Forced sexual activity: Not on file  Other Topics Concern  . Not on file  Social History Narrative  . Not on file     Constitutional: Denies fever, malaise, fatigue, headache or abrupt weight changes.  Respiratory: Denies difficulty breathing, shortness of breath, cough or sputum production.   Cardiovascular: Denies chest pain, chest tightness, palpitations or swelling in the hands or feet.  Gastrointestinal: Denies abdominal pain, bloating, constipation, diarrhea or blood in the stool.  GU: Pt reports urgency, dysuria and blood in her urine. Denies requency, burning sensation, odor or discharge. Skin: Pt reports rash. Denies ulcercations.    No other specific complaints in a complete review of systems (except as listed in HPI above).     Objective:   Physical Exam   BP 124/76   Pulse 85   Temp 98.1 F (36.7 C) (Oral)   Ht 5\' 3"  (1.6 m)   Wt 142 lb 8 oz (64.6 kg)   SpO2 98%   BMI 25.24 kg/m  Wt Readings from Last 3 Encounters:  07/23/18 142 lb 8 oz (64.6 kg)  06/26/18 144 lb (65.3 kg)  05/06/18 140 lb (63.5 kg)    General: Appears her stated age, well developed, well nourished in NAD. Skin: Warm, dry and intact. Grouped vesicular (some crusty) lesions noted on erythematous base noted of right upper arm, left lower arm. Cardiovascular: Normal rate and rhythm.  Pulmonary/Chest: Normal effort and positive vesicular breath sounds. No respiratory distress. No wheezes, rales or ronchi noted.  Abdomen: Soft and nontender. Normal bowel sounds. No distention or masses noted. No CVA tenderness noted. Neurological: Alert and oriented.    BMET    Component Value Date/Time   NA 142 06/26/2018 1521   NA 140 02/28/2016 0900   NA 144 07/10/2011 1025   K 4.6 06/26/2018 1521   K 3.6 07/10/2011 1025   CL 106  06/26/2018 1521   CL 108 (H) 07/10/2011 1025   CO2 26 06/26/2018 1521   CO2 24 07/10/2011 1025   GLUCOSE 138 (H) 06/26/2018 1521   GLUCOSE 142 (H) 07/10/2011 1025   BUN 14 06/26/2018 1521   BUN 10 02/28/2016 0900   BUN 10 07/10/2011 1025   CREATININE 1.00 06/26/2018 1521   CALCIUM 9.4 06/26/2018 1521   CALCIUM 8.7 07/10/2011 1025   GFRNONAA >60 06/19/2017  0708   GFRNONAA >60 07/10/2011 1025   GFRAA >60 06/19/2017 0708   GFRAA >60 07/10/2011 1025    Lipid Panel     Component Value Date/Time   CHOL 206 (H) 06/26/2018 1521   CHOL 209 (H) 10/16/2017 1438   TRIG 414 (H) 06/26/2018 1521   HDL 42 (L) 06/26/2018 1521   HDL 42 10/16/2017 1438   CHOLHDL 4.9 06/26/2018 1521   VLDL 23.6 07/13/2013 1122   LDLCALC  06/26/2018 1521     Comment:     . LDL cholesterol not calculated. Triglyceride levels greater than 400 mg/dL invalidate calculated LDL results. . Reference range: <100 . Desirable range <100 mg/dL for primary prevention;   <70 mg/dL for patients with CHD or diabetic patients  with > or = 2 CHD risk factors. Marland Kitchen LDL-C is now calculated using the Martin-Hopkins  calculation, which is a validated novel method providing  better accuracy than the Friedewald equation in the  estimation of LDL-C.  Horald Pollen et al. Lenox Ahr. 4585;929(24): 2061-2068  (http://education.[facility].com/faq/FAQ164)     CBC    Component Value Date/Time   WBC 8.0 06/26/2018 1521   RBC 5.36 (H) 06/26/2018 1521   HGB 15.6 (H) 06/26/2018 1521   HGB 14.4 02/28/2016 0900   HCT 45.5 (H) 06/26/2018 1521   HCT 42.2 02/28/2016 0900   PLT 260 06/26/2018 1521   PLT 252 02/28/2016 0900   MCV 84.9 06/26/2018 1521   MCV 85 02/28/2016 0900   MCV 88 07/10/2011 1025   MCH 29.1 06/26/2018 1521   MCHC 34.3 06/26/2018 1521   RDW 12.7 06/26/2018 1521   RDW 13.2 02/28/2016 0900   RDW 12.6 07/10/2011 1025   LYMPHSABS 3.4 06/19/2017 0708   LYMPHSABS 3.6 (H) 02/28/2016 0900   MONOABS 0.4 06/19/2017 0708    EOSABS 0.2 06/19/2017 0708   EOSABS 0.3 02/28/2016 0900   BASOSABS 0.1 06/19/2017 0708   BASOSABS 0.1 02/28/2016 0900    Hgb A1C Lab Results  Component Value Date   HGBA1C 5.4 02/28/2016           Assessment & Plan:   Urinary Urgency, Dysuria, Blood in Urine:  Urinalysis: 1+ blood Will send urine culture Encouraged her to push fluids Can take AZO OTC RX for Macrobid 100 mg BID x 5 days  Contact Dermatitis due to Plant:  RX for Pred Taper x 6 days (avoid OTC NSAID's) Ok to continue Hydrocortisone cream OTC Can also use oatmeal baths, Benadryl or Calamine Lotion  Return precautions discussed Nicki Reaper, NP

## 2018-07-25 LAB — URINE CULTURE
MICRO NUMBER:: 371449
SPECIMEN QUALITY:: ADEQUATE

## 2018-07-27 ENCOUNTER — Encounter: Payer: Self-pay | Admitting: Internal Medicine

## 2018-08-18 ENCOUNTER — Encounter: Payer: Self-pay | Admitting: Internal Medicine

## 2018-08-19 ENCOUNTER — Encounter: Payer: Self-pay | Admitting: Internal Medicine

## 2018-08-19 ENCOUNTER — Ambulatory Visit (INDEPENDENT_AMBULATORY_CARE_PROVIDER_SITE_OTHER): Payer: Managed Care, Other (non HMO) | Admitting: Internal Medicine

## 2018-08-19 DIAGNOSIS — R0781 Pleurodynia: Secondary | ICD-10-CM

## 2018-08-19 DIAGNOSIS — T7840XA Allergy, unspecified, initial encounter: Secondary | ICD-10-CM

## 2018-08-19 DIAGNOSIS — J4531 Mild persistent asthma with (acute) exacerbation: Secondary | ICD-10-CM | POA: Diagnosis not present

## 2018-08-19 DIAGNOSIS — R059 Cough, unspecified: Secondary | ICD-10-CM

## 2018-08-19 DIAGNOSIS — R05 Cough: Secondary | ICD-10-CM

## 2018-08-19 MED ORDER — CYCLOBENZAPRINE HCL 5 MG PO TABS: ORAL_TABLET | ORAL | 0 refills | Status: DC

## 2018-08-19 MED ORDER — PREDNISONE 10 MG PO TABS: ORAL_TABLET | ORAL | 0 refills | Status: DC

## 2018-08-19 MED ORDER — TRAMADOL HCL 50 MG PO TABS: 50.0000 mg | ORAL_TABLET | Freq: Four times a day (QID) | ORAL | 0 refills | Status: DC | PRN

## 2018-08-19 NOTE — Patient Instructions (Signed)

## 2018-08-19 NOTE — Progress Notes (Signed)
Virtual Visit via Video Note  I connected with Tasha Miller on 08/19/18 at  3:00 PM EDT by a video enabled telemedicine application and verified that I am speaking with the correct person using two identifiers.   I discussed the limitations of evaluation and management by telemedicine and the availability of in person appointments. The patient expressed understanding and agreed to proceed.  Patient Location: Work  Provider Location: Home  History of Present Illness:  Pt reports cough. This started 2 days ago. The cough is productive of clear, thick mucous. She denies runny nose, nasal congestion, ear pain, sore throat or shortness of breath. She denies fever, chills or body aches. She reports the cough is worse if she goes outside. She has taken her Flovent and Albuterol with minimal relief.   She also reports pain in the left side of her ribs. 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 She describes the pain as sharp, stabbing. The pain is worse with movement, coughing or taking a deep breath. She has tried Ibuprofen with minimal relief.   Past Medical History:  Diagnosis Date  . PONV (postoperative nausea and vomiting)   . Rubella   . SVT (supraventricular tachycardia) (HCC)     Current Outpatient Medications  Medication Sig Dispense Refill  . albuterol (PROVENTIL HFA;VENTOLIN HFA) 108 (90 Base) MCG/ACT inhaler Inhale 2 puffs into the lungs every 6 (six) hours as needed for wheezing or shortness of breath. (Patient taking differently: Inhale 2 puffs into the lungs as needed for wheezing or shortness of breath. ) 1 Inhaler 12  . cyanocobalamin (,VITAMIN B-12,) 1000 MCG/ML injection Inject 1,000 mcg into the muscle every 14 (fourteen) days.    . fluticasone (FLOVENT HFA) 220 MCG/ACT inhaler Inhale 2 puffs into the lungs 2 (two) times daily. (Any further refills would need to be done by Nicki Reaperegina , pt's PCP) 1 Inhaler 0  . ibuprofen (MOTRIN IB)  200 MG tablet Take 3 tablets (600 mg total) by mouth every 6 (six) hours as needed. 30 tablet 0  . ipratropium-albuterol (DUONEB) 0.5-2.5 (3) MG/3ML SOLN Take 3 mLs by nebulization every 4 (four) hours as needed. (Patient taking differently: Take 3 mLs by nebulization as needed. ) 360 mL 1  . lamoTRIgine (LAMICTAL) 25 MG tablet Take 4 tablets (100 mg total) by mouth daily. 120 tablet 5  . meloxicam (MOBIC) 15 MG tablet Take 1 tablet (15 mg total) by mouth daily. 30 tablet 0  . nitrofurantoin, macrocrystal-monohydrate, (MACROBID) 100 MG capsule Take 1 capsule (100 mg total) by mouth 2 (two) times daily. 10 capsule 0  . NONFORMULARY OR COMPOUNDED ITEM Depo Testadiol 50/2    . predniSONE (DELTASONE) 10 MG tablet Take 6 tabs day 1, 5 tabs day 2, 4 tabs day 3, 3 tabs day 4, 2 tabs day 5, 1 tab day 6 21 tablet 0  . traMADol (ULTRAM) 50 MG tablet Take 1 tablet (50 mg total) by mouth every 6 (six) hours as needed. 15 tablet 0   No current facility-administered medications for this visit.     Allergies  Allergen Reactions  . Latex Itching and Swelling  . Sulfa Antibiotics Itching and Nausea And Vomiting  . Sulfonamide Derivatives Nausea And Vomiting    Family History  Problem Relation Age of Onset  . Cancer Maternal Aunt        thyroid cancer  . Cancer Maternal Uncle   . Diabetes Paternal Aunt   .  Heart disease Neg Hx   . Stroke Neg Hx   . Breast cancer Neg Hx     Social History   Socioeconomic History  . Marital status: Married    Spouse name: Not on file  . Number of children: Not on file  . Years of education: Not on file  . Highest education level: Not on file  Occupational History  . Not on file  Social Needs  . Financial resource strain: Not on file  . Food insecurity:    Worry: Not on file    Inability: Not on file  . Transportation needs:    Medical: Not on file    Non-medical: Not on file  Tobacco Use  . Smoking status: Never Smoker  . Smokeless tobacco: Never Used   Substance and Sexual Activity  . Alcohol use: No  . Drug use: No  . Sexual activity: Yes  Lifestyle  . Physical activity:    Days per week: Not on file    Minutes per session: Not on file  . Stress: Not on file  Relationships  . Social connections:    Talks on phone: Not on file    Gets together: Not on file    Attends religious service: Not on file    Active member of club or organization: Not on file    Attends meetings of clubs or organizations: Not on file    Relationship status: Not on file  . Intimate partner violence:    Fear of current or ex partner: Not on file    Emotionally abused: Not on file    Physically abused: Not on file    Forced sexual activity: Not on file  Other Topics Concern  . Not on file  Social History Narrative  . Not on file     Constitutional: Denies fever, malaise, fatigue, headache or abrupt weight changes.  HEENT: Denies eye pain, eye redness, ear pain, ringing in the ears, wax buildup, runny nose, nasal congestion, bloody nose, or sore throat. Respiratory: Pt reports cough. Denies difficulty breathing, shortness of breath, cough or sputum production.   Cardiovascular: Denies chest pain, chest tightness, palpitations or swelling in the hands or feet.  Gastrointestinal: Denies abdominal pain, bloating, constipation, diarrhea or blood in the stool.  GU: Denies urgency, frequency, pain with urination, burning sensation, blood in urine, odor or discharge. Musculoskeletal: Pt reports left side rib pain. Denies decrease in range of motion, difficulty with gait, muscle pain or joint pain and swelling.  Skin: Denies redness, rashes, lesions or ulcercations.  Neurological: Denies dizziness, difficulty with memory, difficulty with speech or problems with balance and coordination.  Psych: Denies anxiety, depression, SI/HI.  No other specific complaints in a complete review of systems (except as listed in HPI above).   Wt Readings from Last 3  Encounters:  07/23/18 142 lb 8 oz (64.6 kg)  06/26/18 144 lb (65.3 kg)  05/06/18 140 lb (63.5 kg)    General: Appears her stated age, well developed, well nourished in NAD. Skin: Warm, dry and intact. No rashes or bruising noted of the left side ribs. HEENT: Head: normal shape and size; Eyes: sclera whiten and EOMs intact;  Pulmonary/Chest: Normal effort. No respiratory distress.  Musculoskeletal: No obvious deformity with visualization of the left side ribs. Neurological: Alert and oriented.    BMET    Component Value Date/Time   NA 142 06/26/2018 1521   NA 140 02/28/2016 0900   NA 144 07/10/2011 1025   K  4.6 06/26/2018 1521   K 3.6 07/10/2011 1025   CL 106 06/26/2018 1521   CL 108 (H) 07/10/2011 1025   CO2 26 06/26/2018 1521   CO2 24 07/10/2011 1025   GLUCOSE 138 (H) 06/26/2018 1521   GLUCOSE 142 (H) 07/10/2011 1025   BUN 14 06/26/2018 1521   BUN 10 02/28/2016 0900   BUN 10 07/10/2011 1025   CREATININE 1.00 06/26/2018 1521   CALCIUM 9.4 06/26/2018 1521   CALCIUM 8.7 07/10/2011 1025   GFRNONAA >60 06/19/2017 0708   GFRNONAA >60 07/10/2011 1025   GFRAA >60 06/19/2017 0708   GFRAA >60 07/10/2011 1025    Lipid Panel     Component Value Date/Time   CHOL 206 (H) 06/26/2018 1521   CHOL 209 (H) 10/16/2017 1438   TRIG 414 (H) 06/26/2018 1521   HDL 42 (L) 06/26/2018 1521   HDL 42 10/16/2017 1438   CHOLHDL 4.9 06/26/2018 1521   VLDL 23.6 07/13/2013 1122   LDLCALC  06/26/2018 1521     Comment:     . LDL cholesterol not calculated. Triglyceride levels greater than 400 mg/dL invalidate calculated LDL results. . Reference range: <100 . Desirable range <100 mg/dL for primary prevention;   <70 mg/dL for patients with CHD or diabetic patients  with > or = 2 CHD risk factors. Marland Kitchen LDL-C is now calculated using the Martin-Hopkins  calculation, which is a validated novel method providing  better accuracy than the Friedewald equation in the  estimation of LDL-C.  Horald Pollen  et al. Lenox Ahr. 1027;253(66): 2061-2068  (http://education.[facility].com/faq/FAQ164)     CBC    Component Value Date/Time   WBC 8.0 06/26/2018 1521   RBC 5.36 (H) 06/26/2018 1521   HGB 15.6 (H) 06/26/2018 1521   HGB 14.4 02/28/2016 0900   HCT 45.5 (H) 06/26/2018 1521   HCT 42.2 02/28/2016 0900   PLT 260 06/26/2018 1521   PLT 252 02/28/2016 0900   MCV 84.9 06/26/2018 1521   MCV 85 02/28/2016 0900   MCV 88 07/10/2011 1025   MCH 29.1 06/26/2018 1521   MCHC 34.3 06/26/2018 1521   RDW 12.7 06/26/2018 1521   RDW 13.2 02/28/2016 0900   RDW 12.6 07/10/2011 1025   LYMPHSABS 3.4 06/19/2017 0708   LYMPHSABS 3.6 (H) 02/28/2016 0900   MONOABS 0.4 06/19/2017 0708   EOSABS 0.2 06/19/2017 0708   EOSABS 0.3 02/28/2016 0900   BASOSABS 0.1 06/19/2017 0708   BASOSABS 0.1 02/28/2016 0900    Hgb A1C Lab Results  Component Value Date   HGBA1C 5.4 02/28/2016        Assessment and Plan:  Cough:  Likely allergies Continue Zyrtec, Flovent and Albuterol RX for Pred Taper x 6 days  Left Side Rib Pain:  Likely muscle strain Encouraged alternating ice/heat Advised splinting with coughing, avoid heaving lifting RX for Pred Taper x 6 days RX for Flexeril 5 mg TID prn- sedation caution given Refilled Tramadol  Return precautions discussed  Follow Up Instructions:    I discussed the assessment and treatment plan with the patient. The patient was provided an opportunity to ask questions and all were answered. The patient agreed with the plan and demonstrated an understanding of the instructions.   The patient was advised to call back or seek an in-person evaluation if the symptoms worsen or if the condition fails to improve as anticipated.     Nicki Reaper, NP

## 2018-08-21 MED ORDER — ALBUTEROL SULFATE HFA 108 (90 BASE) MCG/ACT IN AERS: 2.0000 | INHALATION_SPRAY | RESPIRATORY_TRACT | 2 refills | Status: DC | PRN

## 2018-08-21 MED ORDER — AMOXICILLIN 875 MG PO TABS: 875.0000 mg | ORAL_TABLET | Freq: Two times a day (BID) | ORAL | 0 refills | Status: DC

## 2018-08-21 MED ORDER — FLUTICASONE PROPIONATE HFA 220 MCG/ACT IN AERO: 2.0000 | INHALATION_SPRAY | Freq: Two times a day (BID) | RESPIRATORY_TRACT | 0 refills | Status: DC

## 2018-09-24 ENCOUNTER — Other Ambulatory Visit: Payer: Self-pay | Admitting: Internal Medicine

## 2018-09-26 NOTE — Telephone Encounter (Signed)
Last filled 08/21/2018.Marland KitchenMarland KitchenMarland Kitchen please advise as I am not sure if this was just for an acute episode

## 2018-10-12 ENCOUNTER — Other Ambulatory Visit: Payer: Self-pay | Admitting: Internal Medicine

## 2018-11-04 ENCOUNTER — Ambulatory Visit (INDEPENDENT_AMBULATORY_CARE_PROVIDER_SITE_OTHER): Payer: Managed Care, Other (non HMO) | Admitting: Podiatry

## 2018-11-04 ENCOUNTER — Ambulatory Visit: Payer: Self-pay | Admitting: Podiatry

## 2018-11-04 ENCOUNTER — Ambulatory Visit (INDEPENDENT_AMBULATORY_CARE_PROVIDER_SITE_OTHER): Payer: Managed Care, Other (non HMO)

## 2018-11-04 ENCOUNTER — Encounter: Payer: Self-pay | Admitting: Podiatry

## 2018-11-04 ENCOUNTER — Other Ambulatory Visit: Payer: Self-pay

## 2018-11-04 VITALS — BP 107/63 | HR 88 | Temp 99.9°F | Resp 16

## 2018-11-04 DIAGNOSIS — M2580 Other specified joint disorders, unspecified joint: Secondary | ICD-10-CM | POA: Diagnosis not present

## 2018-11-04 DIAGNOSIS — M2012 Hallux valgus (acquired), left foot: Secondary | ICD-10-CM

## 2018-11-04 DIAGNOSIS — M2011 Hallux valgus (acquired), right foot: Secondary | ICD-10-CM

## 2018-11-04 NOTE — Progress Notes (Signed)
Subjective:  Patient ID: Tasha Miller, female    DOB: 07/02/1970,  MRN: [id number] HPI Chief Complaint  Patient presents with  . 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 New Patient (Initial Visit)    48 y.o. female presents with the above complaint.   ROS: Denies fever chills nausea vomiting muscle aches pains calf pain back pain chest pain shortness of breath.  Past Medical History:  Diagnosis Date  . PONV (postoperative nausea and vomiting)   . Rubella   . SVT (supraventricular tachycardia) (HCC)    Past Surgical History:  Procedure Laterality Date  . DIAGNOSTIC LAPAROSCOPY    . left wrist surgery    . lymph node removal on right side of neck    . right elbow surgery    . rotator cuff surgery    . TONSILLECTOMY    . VAGINAL HYSTERECTOMY    . VULVAR LESION REMOVAL N/A 05/24/2015   Procedure: resection of VULVAR condyloma with colposcope;  Surgeon: Marcelle OverlieMichelle Grewal, MD;  Location: [facility];  Service: Gynecology;  Laterality: N/A;  need colposcope  . WISDOM TOOTH EXTRACTION      Current Outpatient Medications:  .  albuterol (VENTOLIN HFA) 108 (90 Base) MCG/ACT inhaler, Inhale 2 puffs into the lungs as needed for wheezing or shortness of breath., Disp: 1 Inhaler, Rfl: 2 .  cyanocobalamin (,VITAMIN B-12,) 1000 MCG/ML injection, Inject 1,000 mcg into the muscle every 14 (fourteen) days., Disp: , Rfl:  .  estradiol (ESTRACE) 1 MG tablet, , Disp: , Rfl:  .  FLOVENT HFA 220 MCG/ACT inhaler, TAKE 2 PUFFS BY MOUTH TWICE A DAY, Disp: 36 Inhaler, Rfl: 1 .  ibuprofen (MOTRIN IB) 200 MG tablet, Take 3 tablets (600 mg total) by mouth every 6 (six) hours as needed., Disp: 30 tablet, Rfl: 0 .  INTRAROSA 6.5 MG INST, INSERT 1 APPLICATORFUL VAGINALLY EVERY DAY AT NIGHT, Disp: , Rfl:  .  ipratropium-albuterol (DUONEB) 0.5-2.5 (3) MG/3ML SOLN, Take 3  mLs by nebulization every 4 (four) hours as needed. (Patient taking differently: Take 3 mLs by nebulization as needed. ), Disp: 360 mL, Rfl: 1 .  lamoTRIgine (LAMICTAL) 25 MG tablet, Take 4 tablets (100 mg total) by mouth daily., Disp: 120 tablet, Rfl: 5 .  predniSONE (DELTASONE) 10 MG tablet, Take 3 tabs on days 1-2, take 2 tabs on days 3-4, take 1 tab on days 5-6, Disp: 12 tablet, Rfl: 0 .  progesterone (PROMETRIUM) 100 MG capsule, Take 100 mg by mouth at bedtime., Disp: , Rfl:  .  testosterone cypionate (DEPO-TESTOSTERONE) 100 MG/ML injection, Depo-Testosterone 100 mg/mL intramuscular oil  Inject 0.5 mL every 4 weeks by intramuscular route., Disp: , Rfl:  .  Vitamin D, Ergocalciferol, (DRISDOL) 1.25 MG (50000 UT) CAPS capsule, TAKE 1 CAPSULE BY MOUTH WEEKLY, Disp: , Rfl:   Allergies  Allergen Reactions  . Latex Itching and Swelling  . Sulfa Antibiotics Itching and Nausea And Vomiting  . Amoxicillin-Pot Clavulanate   . Sulfonamide Derivatives Nausea And Vomiting   Review of Systems Objective:   Vitals:   11/04/18 1425  BP: 107/63  Pulse: 88  Resp: 16  Temp: 99.9 F (37.7 C)    General: Well developed, nourished, in no acute distress, alert and oriented x3   Dermatological: Skin is warm, dry and supple bilateral. Nails x 10  are well maintained; remaining integument appears unremarkable at this time. There are no open sores, no preulcerative lesions, no rash or signs of infection present.  Vascular: Dorsalis Pedis artery and Posterior Tibial artery pedal pulses are 2/4 bilateral with immedate capillary fill time. Pedal hair growth present. No varicosities and no lower extremity edema present bilateral.   Neruologic: Grossly intact via light touch bilateral. Vibratory intact via tuning fork bilateral. Protective threshold with Semmes Wienstein monofilament intact to all pedal sites bilateral. Patellar and Achilles deep tendon reflexes 2+ bilateral. No Babinski or clonus noted  bilateral.   Musculoskeletal: No gross boney pedal deformities bilateral. No pain, crepitus, or limitation noted with foot and ankle range of motion bilateral. Muscular strength 5/5 in all groups tested bilateral.  She has moderate to severe pain on palpation of the plantar medial aspect of the first metatarsophalangeal joint proximal to the sesamoids.  She also has a numb sensation along the medial dorsal cutaneous nerve from about the same area distal to the tip of the toe.  Gait: Unassisted, Nonantalgic.    Radiographs:  Radiographs taken today demonstrate an increase in the first intermetatarsal angle greater than normal value left greater than right.  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  Plan: Discussed etiology pathology conservative versus surgical therapies at this point I like to go ahead and inject the area first today with 20 mg Kenalog 5 mg Marcaine and then consider surgical intervention if this fails to alleviate her symptoms entirely.      T. [place], [place]

## 2018-12-01 ENCOUNTER — Encounter: Payer: Self-pay | Admitting: Emergency Medicine

## 2018-12-01 ENCOUNTER — Emergency Department
Admission: EM | Admit: 2018-12-01 | Discharge: 2018-12-02 | Disposition: A | Discharge status: Home / Self Care | Payer: Managed Care, Other (non HMO) | Attending: Emergency Medicine | Admitting: Emergency Medicine

## 2018-12-01 ENCOUNTER — Emergency Department: Payer: Managed Care, Other (non HMO)

## 2018-12-01 ENCOUNTER — Other Ambulatory Visit: Payer: Self-pay

## 2018-12-01 DIAGNOSIS — R1031 Right lower quadrant pain: Secondary | ICD-10-CM

## 2018-12-01 DIAGNOSIS — Z79899 Other long term (current) drug therapy: Secondary | ICD-10-CM | POA: Insufficient documentation

## 2018-12-01 DIAGNOSIS — R112 Nausea with vomiting, unspecified: Secondary | ICD-10-CM | POA: Insufficient documentation

## 2018-12-01 DIAGNOSIS — N200 Calculus of kidney: Secondary | ICD-10-CM | POA: Insufficient documentation

## 2018-12-01 DIAGNOSIS — R197 Diarrhea, unspecified: Secondary | ICD-10-CM | POA: Insufficient documentation

## 2018-12-01 DIAGNOSIS — Z91040 Latex allergy status: Secondary | ICD-10-CM | POA: Diagnosis not present

## 2018-12-01 DIAGNOSIS — R1011 Right upper quadrant pain: Secondary | ICD-10-CM | POA: Diagnosis present

## 2018-12-01 LAB — CBC WITH DIFFERENTIAL/PLATELET
Abs Immature Granulocytes: 0.04 10*3/uL (ref 0.00–0.07)
Basophils Absolute: 0.1 10*3/uL (ref 0.0–0.1)
Basophils Relative: 1 %
Eosinophils Absolute: 0.2 10*3/uL (ref 0.0–0.5)
Eosinophils Relative: 2 %
HCT: 43.7 % (ref 36.0–46.0)
Hemoglobin: 14.5 g/dL (ref 12.0–15.0)
Immature Granulocytes: 0 %
Lymphocytes Relative: 41 %
Lymphs Abs: 5.4 10*3/uL — ABNORMAL HIGH (ref 0.7–4.0)
MCH: 29 pg (ref 26.0–34.0)
MCHC: 33.2 g/dL (ref 30.0–36.0)
MCV: 87.4 fL (ref 80.0–100.0)
Monocytes Absolute: 0.8 10*3/uL (ref 0.1–1.0)
Monocytes Relative: 6 %
Neutro Abs: 6.6 10*3/uL (ref 1.7–7.7)
Neutrophils Relative %: 50 %
Platelets: 233 10*3/uL (ref 150–400)
RBC: 5 MIL/uL (ref 3.87–5.11)
RDW: 12.5 % (ref 11.5–15.5)
WBC: 13.2 10*3/uL — ABNORMAL HIGH (ref 4.0–10.5)
nRBC: 0 % (ref 0.0–0.2)

## 2018-12-01 LAB — COMPREHENSIVE METABOLIC PANEL
ALT: 20 U/L (ref 0–44)
AST: 21 U/L (ref 15–41)
Albumin: 4.5 g/dL (ref 3.5–5.0)
Alkaline Phosphatase: 63 U/L (ref 38–126)
Anion gap: 8 (ref 5–15)
BUN: 19 mg/dL (ref 6–20)
CO2: 24 mmol/L (ref 22–32)
Calcium: 9.2 mg/dL (ref 8.9–10.3)
Chloride: 107 mmol/L (ref 98–111)
Creatinine, Ser: 0.96 mg/dL (ref 0.44–1.00)
GFR calc Af Amer: >60 mL/min (ref >60)
GFR calc non Af Amer: >60 mL/min (ref >60)
Glucose, Bld: 113 mg/dL — ABNORMAL HIGH (ref 70–99)
Potassium: 4 mmol/L (ref 3.5–5.1)
Sodium: 139 mmol/L (ref 135–145)
Total Bilirubin: 0.4 mg/dL (ref 0.3–1.2)
Total Protein: 7.6 g/dL (ref 6.5–8.1)

## 2018-12-01 LAB — LIPASE, BLOOD: Lipase: 69 U/L — ABNORMAL HIGH (ref 11–51)

## 2018-12-01 MED ORDER — KETOROLAC TROMETHAMINE 30 MG/ML IJ SOLN
15.0000 mg | Freq: Once | INTRAMUSCULAR | Status: AC
  Administered 2018-12-01: 15 mg via INTRAVENOUS
  Filled 2018-12-01: qty 1

## 2018-12-01 MED ORDER — SODIUM CHLORIDE 0.9% FLUSH: 3.0000 mL | Freq: Once | INTRAVENOUS | Status: DC

## 2018-12-01 MED ORDER — ONDANSETRON HCL 4 MG/2ML IJ SOLN
4.0000 mg | Freq: Once | INTRAMUSCULAR | Status: AC
  Administered 2018-12-01: 4 mg via INTRAVENOUS
  Filled 2018-12-01: qty 2

## 2018-12-01 MED ORDER — FENTANYL CITRATE (PF) 100 MCG/2ML IJ SOLN
50.0000 ug | Freq: Once | INTRAMUSCULAR | Status: AC
  Administered 2018-12-01: 50 ug via INTRAVENOUS
  Filled 2018-12-01: qty 2

## 2018-12-01 NOTE — ED Triage Notes (Signed)
Pt presents to ED with sudden onset of severe right sided abd pain that radiates to her back. +nausea.

## 2018-12-01 NOTE — ED Notes (Signed)
Patient transported to CT 

## 2018-12-02 ENCOUNTER — Emergency Department: Payer: Managed Care, Other (non HMO)

## 2018-12-02 LAB — URINALYSIS, COMPLETE (UACMP) WITH MICROSCOPIC
Bacteria, UA: NONE SEEN
Bilirubin Urine: NEGATIVE
Glucose, UA: NEGATIVE mg/dL
Ketones, ur: NEGATIVE mg/dL
Leukocytes,Ua: NEGATIVE
Nitrite: NEGATIVE
Protein, ur: NEGATIVE mg/dL
RBC / HPF: >50 RBC/hpf — ABNORMAL HIGH (ref 0–5)
Specific Gravity, Urine: 1.019 (ref 1.005–1.030)
WBC, UA: NONE SEEN WBC/hpf (ref 0–5)
pH: 5 (ref 5.0–8.0)

## 2018-12-02 MED ORDER — OXYCODONE-ACETAMINOPHEN 5-325 MG PO TABS: 1.0000 | ORAL_TABLET | ORAL | 0 refills | Status: DC | PRN

## 2018-12-02 MED ORDER — IBUPROFEN 600 MG PO TABS: 600.0000 mg | ORAL_TABLET | Freq: Four times a day (QID) | ORAL | 0 refills | Status: DC | PRN

## 2018-12-02 MED ORDER — OXYCODONE HCL 5 MG PO TABS
5.0000 mg | ORAL_TABLET | Freq: Once | ORAL | Status: AC
  Administered 2018-12-02: 5 mg via ORAL
  Filled 2018-12-02: qty 1

## 2018-12-02 MED ORDER — KETOROLAC TROMETHAMINE 30 MG/ML IJ SOLN
15.0000 mg | Freq: Once | INTRAMUSCULAR | Status: AC
  Administered 2018-12-02: 15 mg via INTRAVENOUS
  Filled 2018-12-02: qty 1

## 2018-12-02 MED ORDER — ONDANSETRON 4 MG PO TBDP: 4.0000 mg | ORAL_TABLET | Freq: Three times a day (TID) | ORAL | 0 refills | Status: DC | PRN

## 2018-12-02 MED ORDER — ACETAMINOPHEN 500 MG PO TABS
1000.0000 mg | ORAL_TABLET | Freq: Once | ORAL | Status: AC
  Administered 2018-12-02: 1000 mg via ORAL
  Filled 2018-12-02: qty 2

## 2018-12-02 NOTE — ED Provider Notes (Signed)
[facility] Emergency Department Provider Note  ____________________________________________  Time seen: Approximately 12:19 AM  I have reviewed the triage vital signs and the nursing notes.   HISTORY  Chief Complaint Abdominal Pain   HPI Tasha Miller is a 48 y.o. female presents for evaluation of right-sided abdominal pain.  Patient reports sudden onset of severe sharp pain located in the right upper quadrant and radiating down to her abdomen.  Pain associated with nausea and vomiting.  Patient has had 1 prior episode of kidney stone which was similar to this 1.  Patient reports the pain started after she had one episode of diarrhea.  She has had a hysterectomy but no other abdominal surgeries.   Past Medical History:  Diagnosis Date   PONV (postoperative nausea and vomiting)    Rubella    SVT (supraventricular tachycardia) [facility])     Patient Active Problem List   Diagnosis Date Noted   Pain of left hand 07/08/2018   Hypertriglyceridemia 07/13/2013   BIPOLAR AFFECTIVE DISORDER 05/21/2007    Past Surgical History:  Procedure Laterality Date   DIAGNOSTIC LAPAROSCOPY     left wrist surgery     lymph node removal on right side of neck     right elbow surgery     rotator cuff surgery     TONSILLECTOMY     VAGINAL HYSTERECTOMY     VULVAR LESION REMOVAL N/A 05/24/2015   Procedure: resection of VULVAR condyloma with colposcope;  Surgeon: Marcelle Overlie, MD;  Location: [facility];  Service: Gynecology;  Laterality: N/A;  need colposcope   WISDOM TOOTH EXTRACTION      Prior to Admission medications   Medication Sig Start Date End Date Taking? Authorizing Provider  albuterol (VENTOLIN HFA) 108 (90 Base) MCG/ACT inhaler Inhale 2 puffs into the lungs as needed for wheezing or shortness of breath. 08/21/18   Lorre Munroe, NP  cyanocobalamin (,VITAMIN B-12,) 1000 MCG/ML injection Inject 1,000 mcg into the muscle every 14 (fourteen) days.     [provider]  estradiol (ESTRACE) 1 MG tablet  09/14/18   [provider]  FLOVENT HFA 220 MCG/ACT inhaler TAKE 2 PUFFS BY MOUTH TWICE A DAY 10/12/18   Lorre Munroe, NP  ibuprofen (ADVIL) 600 MG tablet Take 1 tablet (600 mg total) by mouth every 6 (six) hours as needed. 12/02/18   Don Perking, [place], MD  INTRAROSA 6.5 MG INST INSERT 1 APPLICATORFUL VAGINALLY EVERY DAY AT NIGHT 08/29/18   [provider]  ipratropium-albuterol (DUONEB) 0.5-2.5 (3) MG/3ML SOLN Take 3 mLs by nebulization every 4 (four) hours as needed. Patient taking differently: Take 3 mLs by nebulization as needed.  04/29/17   Collene Gobble, MD  lamoTRIgine (LAMICTAL) 25 MG tablet Take 4 tablets (100 mg total) by mouth daily. 06/26/18   Lorre Munroe, NP  ondansetron (ZOFRAN ODT) 4 MG disintegrating tablet Take 1 tablet (4 mg total) by mouth every 8 (eight) hours as needed. 12/02/18   Nita Sickle, MD  oxyCODONE-acetaminophen (PERCOCET) 5-325 MG tablet Take 1 tablet by mouth every 4 (four) hours as needed. 12/02/18   Nita Sickle, MD  predniSONE (DELTASONE) 10 MG tablet Take 3 tabs on days 1-2, take 2 tabs on days 3-4, take 1 tab on days 5-6 08/19/18   Lorre Munroe, NP  progesterone (PROMETRIUM) 100 MG capsule Take 100 mg by mouth at bedtime. 08/27/18   [provider]  testosterone cypionate (DEPO-TESTOSTERONE) 100 MG/ML injection Depo-Testosterone 100 mg/mL  intramuscular oil  Inject 0.5 mL every 4 weeks by intramuscular route.    [provider]  Vitamin D, Ergocalciferol, (DRISDOL) 1.25 MG (50000 UT) CAPS capsule TAKE 1 CAPSULE BY MOUTH WEEKLY 08/13/18   [provider]    Allergies Latex, Sulfa antibiotics, Amoxicillin-pot clavulanate, and Sulfonamide derivatives  Family History  Problem Relation Age of Onset   Cancer Maternal Aunt        thyroid cancer   Cancer Maternal Uncle    Diabetes Paternal Aunt    Heart disease Neg Hx    Stroke Neg Hx     Breast cancer Neg Hx     Social History Social History   Tobacco Use   Smoking status: Never Smoker   Smokeless tobacco: Never Used  Substance Use Topics   Alcohol use: No   Drug use: No    Review of Systems  Constitutional: Negative for fever. Eyes: Negative for visual changes. ENT: Negative for sore throat. Neck: No neck pain  Cardiovascular: Negative for chest pain. Respiratory: Negative for shortness of breath. Gastrointestinal: + R sided abdominal pain, nausea, and vomiting. No diarrhea. Genitourinary: Negative for dysuria. Musculoskeletal: Negative for back pain. Skin: Negative for rash. Neurological: Negative for headaches, weakness or numbness. Psych: No SI or HI  ____________________________________________   PHYSICAL EXAM:  VITAL SIGNS: ED Triage Vitals  Enc Vitals Group     BP 12/01/18 2310 (!) 133/122     Pulse Rate 12/01/18 2310 85     Resp 12/01/18 2310 (!) 24     Temp 12/01/18 2310 98.1 F (36.7 C)     Temp Source 12/01/18 2310 Oral     SpO2 12/01/18 2310 98 %     Weight 12/01/18 2311 138 lb (62.6 kg)     Height 12/01/18 2311 5\' 4"  (1.626 m)     Head Circumference --      Peak Flow --      Pain Score 12/01/18 2311 10     Pain Loc --      Pain Edu? --      Excl. in GC? --     Constitutional: Alert and oriented, in significant distress due to pain.  HEENT:      Head: Normocephalic and atraumatic.         Eyes: Conjunctivae are normal. Sclera is non-icteric.       Mouth/Throat: Mucous membranes are moist.       Neck: Supple with no signs of meningismus. Cardiovascular: Regular rate and rhythm. No murmurs, gallops, or rubs. 2+ symmetrical distal pulses are present in all extremities. No JVD. Respiratory: Normal respiratory effort. Lungs are clear to auscultation bilaterally. No wheezes, crackles, or rhonchi.  Gastrointestinal: Soft, tender to palpation on the RLQ, and non distended with positive bowel sounds. No rebound or  guarding. Genitourinary: R CVA tenderness. Musculoskeletal: Nontender with normal range of motion in all extremities. No edema, cyanosis, or erythema of extremities. Neurologic: Normal speech and language. Face is symmetric. Moving all extremities. No gross focal neurologic deficits are appreciated. Skin: Skin is warm, dry and intact. No rash noted. Psychiatric: Mood and affect are normal. Speech and behavior are normal.  ____________________________________________   LABS (all labs ordered are listed, but only abnormal results are displayed)  Labs Reviewed  LIPASE, BLOOD - Abnormal; Notable for the following components:      Result Value   Lipase 69 (*)    All other components within normal limits  COMPREHENSIVE METABOLIC PANEL - Abnormal; Notable  for the following components:   Glucose, Bld 113 (*)    All other components within normal limits  URINALYSIS, COMPLETE (UACMP) WITH MICROSCOPIC - Abnormal; Notable for the following components:   Color, Urine YELLOW (*)    APPearance HAZY (*)    Hgb urine dipstick LARGE (*)    RBC / HPF >50 (*)    All other components within normal limits  CBC WITH DIFFERENTIAL/PLATELET - Abnormal; Notable for the following components:   WBC 13.2 (*)    Lymphs Abs 5.4 (*)    All other components within normal limits  POC URINE PREG, ED   ____________________________________________  EKG  none  ____________________________________________  RADIOLOGY  I have personally reviewed the images performed during this visit and I agree with the Radiologist's read.   Interpretation by Radiologist:  US Pelvic Doppler (torsion R/o Or Mass Arterial Flow)  Result Date: 12/02/2018 CLINICAL DATA:  48 year old female with right lower quadrant abdominal pain x2 hours. History of prior hysterectomy in 2000. EXAM: TRANSABDOMINAL AND TRANSVAGINAL ULTRASOUND OF PELVIS DOPPLER ULTRASOUND OF OVARIES TECHNIQUE: Both transabdominal and transvaginal ultrasound  examinations of the pelvis were performed. Transabdominal technique was performed for global imaging of the pelvis including uterus, ovaries, adnexal regions, and pelvic cul-de-sac. It was necessary to proceed with endovaginal exam following the transabdominal exam to visualize the ovaries. Color and duplex Doppler ultrasound was utilized to evaluate blood flow to the ovaries. COMPARISON:  None. FINDINGS: Uterus Hysterectomy. Endometrium Hysterectomy. Right ovary Measurements: 3.6 x 0.9 x 2.1 cm = volume: 3.6 mL. Normal appearance/no adnexal mass. Left ovary Not visualized. Pulsed Doppler evaluation of the right ovary demonstrates normal low-resistance arterial and venous waveforms. Other findings No abnormal free fluid. IMPRESSION: 1. Unremarkable right ovary with Doppler detected arterial and venous flow. 2. Nonvisualization of the left ovary. 3. Hysterectomy. Electronically Signed   By: Anner Crete M.D.   On: 12/02/2018 01:02   Ct Renal Stone Study  Result Date: 12/01/2018 CLINICAL DATA:  Right-sided flank pain EXAM: CT ABDOMEN AND PELVIS WITHOUT CONTRAST TECHNIQUE: Multidetector CT imaging of the abdomen and pelvis was performed following the standard protocol without IV contrast. COMPARISON:  June 19, 2017. FINDINGS: Lower chest: The visualized heart size within normal limits. No pericardial fluid/thickening. No hiatal hernia. The visualized portions of the lungs are clear. Hepatobiliary: Although limited due to the lack of intravenous contrast, normal in appearance without gross focal abnormality. No evidence of calcified gallstones or biliary ductal dilatation. Pancreas:  Unremarkable.  No surrounding inflammatory changes. Spleen: Normal in size. Although limited due to the lack of intravenous contrast, normal in appearance. Adrenals/Urinary Tract: Both adrenal glands appear normal. Again noted are punctate right-sided renal calculi the largest measuring 2 mm in the midpole. No right-sided  hydronephrosis. No right ureteral or bladder calculi are noted. On the left side there is a punctate 2 mm calculus seen in the posterior left bladder. No left-sided renal calculi are noted. No hydronephrosis. Stomach/Bowel: The stomach, small bowel, and colon are normal in appearance. No inflammatory changes or obstructive findings. appendix is normal. Vascular/Lymphatic: There are no enlarged abdominal or pelvic lymph nodes. No significant gross vascular findings are present. Reproductive: The uterus and adnexa are unremarkable. Other: No evidence of abdominal wall mass or hernia. Musculoskeletal: No acute or significant osseous findings. IMPRESSION: 1. Punctate small right-sided renal calculi without hydronephrosis. 2. Small 2 mm left posterior bladder calculus. Electronically Signed   By: Prudencio Pair M.D.   On: 12/01/2018 23:47  [place] Pelvic Complete With Transvaginal  Result Date: 12/02/2018 CLINICAL DATA:  48 year old female with right lower quadrant abdominal pain x2 hours. History of prior hysterectomy in 2000. EXAM: TRANSABDOMINAL AND TRANSVAGINAL ULTRASOUND OF PELVIS DOPPLER ULTRASOUND OF OVARIES TECHNIQUE: Both transabdominal and transvaginal ultrasound examinations of the pelvis were performed. Transabdominal technique was performed for global imaging of the pelvis including uterus, ovaries, adnexal regions, and pelvic cul-de-sac. It was necessary to proceed with endovaginal exam following the transabdominal exam to visualize the ovaries. Color and duplex Doppler ultrasound was utilized to evaluate blood flow to the ovaries. COMPARISON:  None. FINDINGS: Uterus Hysterectomy. Endometrium Hysterectomy. Right ovary Measurements: 3.6 x 0.9 x 2.1 cm = volume: 3.6 mL. Normal appearance/no adnexal mass. Left ovary Not visualized. Pulsed Doppler evaluation of the right ovary demonstrates normal low-resistance arterial and venous waveforms. Other findings No abnormal free fluid. IMPRESSION: 1. Unremarkable right  ovary with Doppler detected arterial and venous flow. 2. Nonvisualization of the left ovary. 3. Hysterectomy. Electronically Signed   By: Elgie CollardArash  Radparvar M.D.   On: 12/02/2018 01:02     ____________________________________________   PROCEDURES  Procedure(s) performed: None Procedures Critical Care performed:  None ____________________________________________   INITIAL IMPRESSION / ASSESSMENT AND PLAN / ED COURSE   48 y.o. female presents for evaluation of sudden onset severe sharp right-sided abdominal pain associated with nausea and vomiting.  Patient significant distress due to pain, vitals within normal limits, abdomen is soft with right flank tenderness and right-sided tenderness on palpation, no rebound or guarding.  Differential diagnosis includes kidney stone versus ovarian torsion versus ovarian cyst versus gallbladder disease versus appendicitis versus SBO.  Labs and CT are pending.  Symptoms relief with Toradol, fentanyl, and Zofran.    _________________________ 1:49 AM on 12/02/2018 -----------------------------------------  CT showing no evidence of ureteral stone however did show several kidney stones.  Urinalysis positive for hematuria.  Patient was sent for a pelvic ultrasound to rule out ovarian pathology which was negative for torsion or cyst.  Her presentation is most likely concerning for kidney stone not seen on CT.  She has no right upper quadrant tenderness, no Murphy sign.  Mildly elevated lipase most likely from nausea and vomiting.  Patient will be discharged home with follow-up with urology, Percocet, Zofran, and ibuprofen for symptom relief.  Discussed my standard return precautions for kidney stones.    As part of my medical decision making, I reviewed the following data within the electronic MEDICAL RECORD NUMBER Nursing notes reviewed and incorporated, Labs reviewed , Old chart reviewed, Radiograph reviewed , Notes from prior ED visits and  Controlled Substance  Database   Patient was evaluated in Emergency Department today for the symptoms described in the history of present illness. Patient was evaluated in the context of the global COVID-19 pandemic, which necessitated consideration that the patient might be at risk for infection with the SARS-CoV-2 virus that causes COVID-19. Institutional protocols and algorithms that pertain to the evaluation of patients at risk for COVID-19 are in a state of rapid change based on information released by regulatory bodies including the CDC and federal and state organizations. These policies and algorithms were followed during the patient's care in the ED.   ____________________________________________   FINAL CLINICAL IMPRESSION(S) / ED DIAGNOSES   Final diagnoses:  RLQ abdominal pain  Kidney stone      NEW MEDICATIONS STARTED DURING THIS VISIT:  ED Discharge Orders         Ordered    ibuprofen (ADVIL) 600  MG tablet  Every 6 hours PRN     12/02/18 0149    oxyCODONE-acetaminophen (PERCOCET) 5-325 MG tablet  Every 4 hours PRN     12/02/18 0149    ondansetron (ZOFRAN ODT) 4 MG disintegrating tablet  Every 8 hours PRN     12/02/18 0149           Note:  This document was prepared using Dragon voice recognition software and may include unintentional dictation errors.    Don PerkingVeronese, [place], MD 12/02/18 [phone]

## 2018-12-02 NOTE — Discharge Instructions (Signed)
You have been seen in the Emergency Department (ED)  Today and was diagnosed with kidney stones. While the stone is traveling through the ureter, which is the tube that carries urine from the kidney to the bladder, you will probably feel pain. The pain may be mild or very severe. You may also have some blood in your urine. As soon as the stone reaches the bladder, any intense pain should go away. If a stone is too large to pass on its own, you may need a medical procedure to help you pass the stone.   As we have discussed, please drink plenty of fluids and use a urinary strainer to attempt to capture the stone.  Please make a follow up appointment with Urology in the next week by calling the number below and bring the stone with you.  Take ibuprofen 600mg  every 6 hours for the pain. If the pain is not well controlled with ibuprofen you may take one percocet every 4 hours. Do not take tylenol while taking percocet.   Follow-up with your doctor or return to the ER in 12-24 hours if your pain is not well controlled, if you develop pain or burning with urination, or if you develop a fever. Otherwise follow up in 3-5 days with your doctor.  When should you call for help?  Call your doctor now or seek immediate medical care if:  You cannot keep down fluids.  Your pain gets worse.  You have a fever or chills.  You have new or worse pain in your back just below your rib cage (the flank area).  You have new or more blood in your urine. You have pain or burning with urination You are unable to urinate You have abdominal pain  Watch closely for changes in your health, and be sure to contact your doctor if:  You do not get better as expected  How can you care for yourself at home?  Drink plenty of fluids, enough so that your urine is light yellow or clear like water. If you have kidney, heart, or liver disease and have to limit fluids, talk with your doctor before you increase the amount of fluids you  drink.  Take pain medicines exactly as directed. Call your doctor if you think you are having a problem with your medicine.  If the doctor gave you a prescription medicine for pain, take it as prescribed.  If you are not taking a prescription pain medicine, ask your doctor if you can take an over-the-counter medicine. Read and follow all instructions on the label. Your doctor may ask you to strain your urine so that you can collect your kidney stone when it passes. You can use a kitchen strainer or a tea strainer to catch the stone. Store it in a plastic bag until you see your doctor again.  Preventing future kidney stones  Some changes in your diet may help prevent kidney stones. Depending on the cause of your stones, your doctor may recommend that you:  Drink plenty of fluids, enough so that your urine is light yellow or clear like water. If you have kidney, heart, or liver disease and have to limit fluids, talk with your doctor before you increase the amount of fluids you drink.  Limit coffee, tea, and alcohol. Also avoid grapefruit juice.  Do not take more than the recommended daily dose of vitamins C and D.  Avoid antacids such as Gaviscon, Maalox, Mylanta, or Tums.  Limit the amount of  salt (sodium) in your diet.  Eat a balanced diet that is not too high in protein.  Limit foods that are high in a substance called oxalate, which can cause kidney stones. These foods include dark green vegetables, rhubarb, chocolate, wheat bran, nuts, cranberries, and beans.

## 2018-12-02 NOTE — ED Notes (Signed)
Pt has complete hysterectomy

## 2018-12-16 ENCOUNTER — Ambulatory Visit: Payer: Managed Care, Other (non HMO) | Admitting: Podiatry

## 2019-01-13 ENCOUNTER — Ambulatory Visit: Payer: Managed Care, Other (non HMO) | Admitting: Podiatry

## 2019-02-24 ENCOUNTER — Encounter: Payer: Self-pay | Admitting: Podiatry

## 2019-02-24 ENCOUNTER — Encounter: Payer: Self-pay | Admitting: Internal Medicine

## 2019-02-24 ENCOUNTER — Ambulatory Visit: Payer: Managed Care, Other (non HMO) | Admitting: Podiatry

## 2019-02-24 ENCOUNTER — Other Ambulatory Visit: Payer: Self-pay

## 2019-02-24 DIAGNOSIS — M2012 Hallux valgus (acquired), left foot: Secondary | ICD-10-CM | POA: Diagnosis not present

## 2019-02-24 DIAGNOSIS — M778 Other enthesopathies, not elsewhere classified: Secondary | ICD-10-CM

## 2019-02-24 MED ORDER — MELOXICAM 15 MG PO TABS: 15.0000 mg | ORAL_TABLET | Freq: Every day | ORAL | 3 refills | Status: DC

## 2019-02-24 MED ORDER — METHYLPREDNISOLONE 4 MG PO TBPK: ORAL_TABLET | ORAL | 0 refills | Status: DC

## 2019-02-24 MED ORDER — TRAMADOL HCL 50 MG PO TABS: 50.0000 mg | ORAL_TABLET | Freq: Three times a day (TID) | ORAL | 0 refills | Status: DC | PRN

## 2019-02-24 NOTE — Progress Notes (Signed)
She presents today for follow-up of capsulitis hallux valgus deformity first metatarsophalangeal joint of the left foot.  States that is been hurting really bad recently.  Objective: Vital signs are stable she is alert and oriented x3.  The hallux valgus deformity is reducible once reduced she has good range of motion and nontender.  However with it in its valgus position range of motion is painful plantar medial sesamoidal tendon most likely.  Assessment: Capsulitis hallux valgus sesamoiditis.  Plan: Injected the area today with 10 mg of Kenalog 5 mg of Marcaine.  Tolerated procedure well without complications.  She will follow-up with me in a few weeks to discuss surgical intervention regarding the left foot

## 2019-04-07 ENCOUNTER — Ambulatory Visit: Payer: Managed Care, Other (non HMO) | Admitting: Podiatry

## 2019-04-28 ENCOUNTER — Other Ambulatory Visit: Payer: Self-pay | Admitting: Specialist

## 2019-04-28 DIAGNOSIS — R519 Headache, unspecified: Secondary | ICD-10-CM

## 2019-05-05 ENCOUNTER — Ambulatory Visit: Payer: Managed Care, Other (non HMO) | Admitting: Podiatry

## 2019-05-25 ENCOUNTER — Ambulatory Visit
Admission: RE | Admit: 2019-05-25 | Discharge: 2019-05-25 | Disposition: A | Discharge status: Home / Self Care | Payer: Managed Care, Other (non HMO) | Source: Ambulatory Visit | Attending: Specialist | Admitting: Specialist

## 2019-05-25 DIAGNOSIS — R519 Headache, unspecified: Secondary | ICD-10-CM

## 2019-05-25 MED ORDER — GADOBENATE DIMEGLUMINE 529 MG/ML IV SOLN
15.0000 mL | Freq: Once | INTRAVENOUS | Status: AC | PRN
  Administered 2019-05-25: 10:00:00 15 mL via INTRAVENOUS

## 2019-09-22 ENCOUNTER — Ambulatory Visit: Payer: Managed Care, Other (non HMO) | Admitting: Podiatry

## 2019-09-22 ENCOUNTER — Encounter: Payer: Self-pay | Admitting: Podiatry

## 2019-09-22 ENCOUNTER — Other Ambulatory Visit: Payer: Self-pay

## 2019-09-22 DIAGNOSIS — M778 Other enthesopathies, not elsewhere classified: Secondary | ICD-10-CM | POA: Diagnosis not present

## 2019-09-22 DIAGNOSIS — M2580 Other specified joint disorders, unspecified joint: Secondary | ICD-10-CM | POA: Diagnosis not present

## 2019-09-22 NOTE — Progress Notes (Signed)
She presents today states that she is still having pain around the first metatarsophalangeal joint states been going on for about a month or so now he was doing very good for several months but it came back.  Objective: Vital signs are stable alert oriented x3 still has pain on palpation tibial sesamoid hallux left and along the lateral border of the hallux left.  Radiographs taken today demonstrate joint space narrowing with hallux valgus deformity.  Assessment: Hallux valgus deformity capsulitis sesamoiditis.  Plan: I injected 10 mg the dorsal aspect of the left foot at the lateral border of the first metatarsophalangeal joint.  I injected another 10 mg of Kenalog and local anesthetic at the tibial sesamoid area.  Both of these were exquisitely painful for the patient.  I will follow-up with her as needed.

## 2019-12-29 ENCOUNTER — Ambulatory Visit: Payer: Managed Care, Other (non HMO) | Admitting: Podiatry

## 2019-12-29 ENCOUNTER — Other Ambulatory Visit: Payer: Self-pay

## 2019-12-29 ENCOUNTER — Encounter: Payer: Self-pay | Admitting: Podiatry

## 2019-12-29 DIAGNOSIS — M2011 Hallux valgus (acquired), right foot: Secondary | ICD-10-CM | POA: Diagnosis not present

## 2019-12-29 DIAGNOSIS — M2012 Hallux valgus (acquired), left foot: Secondary | ICD-10-CM | POA: Diagnosis not present

## 2019-12-29 DIAGNOSIS — G5792 Unspecified mononeuropathy of left lower limb: Secondary | ICD-10-CM

## 2019-12-29 DIAGNOSIS — M778 Other enthesopathies, not elsewhere classified: Secondary | ICD-10-CM | POA: Diagnosis not present

## 2019-12-29 NOTE — Progress Notes (Signed)
She presents today states that I am still having pain in my left foot she states that he got better for about a month after the injection that it came right back.  Objective: Vital signs are stable alert oriented x3.  Pulses are palpable.  Neurologic sensorium is intact though she is hyper aesthetic over the distribution of the deep peroneal nerve left.  She has pain on palpation of the sesamoids left over the right and pain on palpation of the hypertrophic medial condyle left over right.  Radiographs once again reviewed demonstrate increase in the first intermetatarsal angle with a hypertrophic medial condyle particularly left over the right.  Joint space narrowing and some dislocation is noted.  Assessment hallux abductovalgus deformity bilateral capsulitis left first metatarsophalangeal joint neuritis deep peroneal nerve left.  Plan: Discussed etiology pathology conservative surgical therapies with both of these being painful we did go ahead and consented her today for a bunion repair first metatarsophalangeal joint.  She understands this and is amenable to it she understands that she will have a nerve block to the left lower extremity and local block to the right lower extremity.  She also understands that she will be into walking boots and we did discuss the possibility of complications which may include but not limited to postop pain bleeding swelling infection recurrence need for further surgery overcorrection under correction.  We also discussed an Austin type bunionectomy with screw fixation.  Answered his questions best my ability layman's terms.  We also dispensed information regarding the surgery center anesthesia group and information for the morning of surgery.  I also injected milligrams of Kenalog beneath the sesamoid apparatus left foot.  And about 5 mg into the deep intermetatarsal space distally.  This should help alleviate her symptoms for a few days and hopefully by December will be able  do surgery.

## 2020-01-18 ENCOUNTER — Encounter: Payer: Self-pay | Admitting: Internal Medicine

## 2020-01-18 ENCOUNTER — Ambulatory Visit (INDEPENDENT_AMBULATORY_CARE_PROVIDER_SITE_OTHER): Payer: Managed Care, Other (non HMO) | Admitting: Internal Medicine

## 2020-01-18 ENCOUNTER — Other Ambulatory Visit: Payer: Self-pay

## 2020-01-18 VITALS — BP 112/68 | HR 64 | Temp 97.4°F | Ht 64.0 in | Wt 147.0 lb

## 2020-01-18 DIAGNOSIS — E010 Iodine-deficiency related diffuse (endemic) goiter: Secondary | ICD-10-CM

## 2020-01-18 DIAGNOSIS — R1012 Left upper quadrant pain: Secondary | ICD-10-CM | POA: Diagnosis not present

## 2020-01-18 DIAGNOSIS — J4520 Mild intermittent asthma, uncomplicated: Secondary | ICD-10-CM

## 2020-01-18 DIAGNOSIS — F3131 Bipolar disorder, current episode depressed, mild: Secondary | ICD-10-CM

## 2020-01-18 DIAGNOSIS — J45909 Unspecified asthma, uncomplicated: Secondary | ICD-10-CM | POA: Insufficient documentation

## 2020-01-18 DIAGNOSIS — Z0000 Encounter for general adult medical examination without abnormal findings: Secondary | ICD-10-CM

## 2020-01-18 DIAGNOSIS — E781 Pure hyperglyceridemia: Secondary | ICD-10-CM

## 2020-01-18 DIAGNOSIS — R748 Abnormal levels of other serum enzymes: Secondary | ICD-10-CM | POA: Diagnosis not present

## 2020-01-18 MED ORDER — LAMOTRIGINE 25 MG PO TABS: 75.0000 mg | ORAL_TABLET | Freq: Every day | ORAL | 3 refills | Status: DC

## 2020-01-18 NOTE — Assessment & Plan Note (Signed)
She has Flovent and Albuterol but does not need to use these Will monitor

## 2020-01-18 NOTE — Progress Notes (Signed)
Subjective:    Patient ID: Tasha Miller, female    DOB: [birth year], 49 y.o.   MRN: [id number]  HPI  Patient presents to the clinic today for her annual exam. She is also due to follow up chronic conditions.  Bipolar Depression: Managed on Lamotrigine. She is not currently seeing a therapist. She denies SI/HI.  Hypertriglyceridemia: Her last triglycerides were 414, 06/2018. She is not taking any cholesterol lowering medication at this time. She tries to consume a low fat diet.  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 She is taking Vit C, Zinc and Magnesium. She has not had to use the inhalers.   Chronic Foot Pain: Managed with Meloxicam and Tramadol. She is having surgery in December. She follows with Dr. Milinda Pointer.  She also reports LUQ abdominal pain. This has been intermittent over the last few months. It is not worse with taking a deep breath or position changes. She denies nausea, vomiting, constipation or blood in her stool. She denies urinary or vaginal complaints. She has not taken anything OTC for this.   Flu: never Tetanus: 06/2018 Covid: never Pap Smear: 2020, [facility], s/p hysterectomy Mammogram: 06/2018 Colon Screening: never Vision Screening: annually Dentist: biannually  Diet: She does eat meat. She consumes fruits and veggies. She does eat some fried foods. She drinks mostly soda. Exercise: None  Review of Systems      Past Medical History:  Diagnosis Date  . PONV (postoperative nausea and vomiting)   . Rubella   . SVT (supraventricular tachycardia) (HCC)     Current Outpatient Medications  Medication Sig Dispense Refill  . albuterol (VENTOLIN HFA) 108 (90 Base) MCG/ACT inhaler Inhale 2 puffs into the lungs as needed for wheezing or shortness of breath. 1 Inhaler 2  . cyanocobalamin (,VITAMIN B-12,) 1000 MCG/ML injection Inject 1,000 mcg into the muscle every 14 (fourteen) days.    Marland Kitchen escitalopram (LEXAPRO) 5 MG tablet Take 5 mg by mouth daily.      Marland Kitchen estradiol (ESTRACE) 1 MG tablet     . FLOVENT HFA 220 MCG/ACT inhaler TAKE 2 PUFFS BY MOUTH TWICE A DAY 36 Inhaler 1  . ibuprofen (ADVIL) 600 MG tablet Take 1 tablet (600 mg total) by mouth every 6 (six) hours as needed. 20 tablet 0  . INTRAROSA 6.5 MG INST INSERT 1 APPLICATORFUL VAGINALLY EVERY DAY AT NIGHT    . ipratropium-albuterol (DUONEB) 0.5-2.5 (3) MG/3ML SOLN Take 3 mLs by nebulization every 4 (four) hours as needed. (Patient taking differently: Take 3 mLs by nebulization as needed. ) 360 mL 1  . lamoTRIgine (LAMICTAL) 25 MG tablet Take 4 tablets (100 mg total) by mouth daily. 120 tablet 5  . meloxicam (MOBIC) 15 MG tablet Take 1 tablet (15 mg total) by mouth daily. 30 tablet 3  . methylPREDNISolone (MEDROL DOSEPAK) 4 MG TBPK tablet 6 day dose pack - take as directed 21 tablet 0  . ondansetron (ZOFRAN ODT) 4 MG disintegrating tablet Take 1 tablet (4 mg total) by mouth every 8 (eight) hours as needed. 20 tablet 0  . oxyCODONE-acetaminophen (PERCOCET) 5-325 MG tablet Take 1 tablet by mouth every 4 (four) hours as needed. 12 tablet 0  . predniSONE (DELTASONE) 10 MG tablet Take 3 tabs on days 1-2, take 2 tabs on days 3-4, take 1 tab on days 5-6 12 tablet 0  . progesterone (PROMETRIUM) 100 MG capsule Take 100 mg by mouth at bedtime.    Marland Kitchen testosterone cypionate (DEPO-TESTOSTERONE) 100 MG/ML injection  Depo-Testosterone 100 mg/mL intramuscular oil  Inject 0.5 mL every 4 weeks by intramuscular route.    . traMADol (ULTRAM) 50 MG tablet Take 1 tablet (50 mg total) by mouth every 8 (eight) hours as needed. 30 tablet 0  . triamcinolone cream (KENALOG) 0.1 % APPLY TO AFFECTED AREA OF SKIN TWICE A DAY AS NEEDED TAPER USE AS ABLE    . Vitamin D, Ergocalciferol, (DRISDOL) 1.25 MG (50000 UT) CAPS capsule TAKE 1 CAPSULE BY MOUTH WEEKLY     No current facility-administered medications for this visit.    Allergies  Allergen Reactions  . Latex Itching and Swelling  . Sulfa Antibiotics Itching and  Nausea And Vomiting  . Amoxicillin-Pot Clavulanate   . Sulfonamide Derivatives Nausea And Vomiting    Family History  Problem Relation Age of Onset  . Cancer Maternal Aunt        thyroid cancer  . Cancer Maternal Uncle   . Diabetes Paternal Aunt   . Heart disease Neg Hx   . Stroke Neg Hx   . Breast cancer Neg Hx     Social History   Socioeconomic History  . Marital status: Soil scientist    Spouse name: Not on file  . Number of children: Not on file  . Years of education: Not on file  . Highest education level: Not on file  Occupational History  . Not on file  Tobacco Use  . Smoking status: Never Smoker  . Smokeless tobacco: Never Used  Vaping Use  . Vaping Use: Never used  Substance and Sexual Activity  . Alcohol use: No  . Drug use: No  . Sexual activity: Yes  Other Topics Concern  . Not on file  Social History Narrative  . Not on file   Social Determinants of Health   Financial Resource Strain:   . Difficulty of Paying Living Expenses: Not on file  Food Insecurity:   . Worried About Charity fundraiser in the Last Year: Not on file  . Ran Out of Food in the Last Year: Not on file  Transportation Needs:   . Lack of Transportation (Medical): Not on file  . Lack of Transportation (Non-Medical): Not on file  Physical Activity:   . Days of Exercise per Week: Not on file  . Minutes of Exercise per Session: Not on file  Stress:   . Feeling of Stress : Not on file  Social Connections:   . Frequency of Communication with Friends and Family: Not on file  . Frequency of Social Gatherings with Friends and Family: Not on file  . Attends Religious Services: Not on file  . Active Member of Clubs or Organizations: Not on file  . Attends Archivist Meetings: Not on file  . Marital Status: Not on file  Intimate Partner Violence:   . Fear of Current or Ex-Partner: Not on file  . Emotionally Abused: Not on file  . Physically Abused: Not on file  . Sexually  Abused: Not on file     Constitutional: Denies fever, malaise, fatigue, headache or abrupt weight changes.  HEENT: Denies eye pain, eye redness, ear pain, ringing in the ears, wax buildup, runny nose, nasal congestion, bloody nose, or sore throat. Respiratory: Denies difficulty breathing, shortness of breath, cough or sputum production.   Cardiovascular: Denies chest pain, chest tightness, palpitations or swelling in the hands or feet.  Gastrointestinal: Pt reports LUQ abdominal pain. Denies bloating, constipation, diarrhea or blood in the stool.  GU:  Denies urgency, frequency, pain with urination, burning sensation, blood in urine, odor or discharge. Musculoskeletal: Pt reports bilateral foot pain, joint pain in hands, left wrist and elbows. Denies decrease in range of motion, difficulty with gait, muscle pain or joint swelling.  Skin: Denies redness, rashes, lesions or ulcercations.  Neurological: Denies dizziness, difficulty with memory, difficulty with speech or problems with balance and coordination.  Psych: Pt has a history of depression. Denies anxiety, SI/HI.  No other specific complaints in a complete review of systems (except as listed in HPI above).  Objective:   Physical Exam BP 112/68   Pulse 64   Temp (!) 97.4 F (36.3 C) (Temporal)   Ht 5' 4" (1.626 m)   Wt 147 lb (66.7 kg)   SpO2 98%   BMI 25.23 kg/m   Wt Readings from Last 3 Encounters:  12/01/18 138 lb (62.6 kg)  07/23/18 142 lb 8 oz (64.6 kg)  06/26/18 144 lb (65.3 kg)    General: Appears her stated age, well developed, well nourished in NAD. Skin: Warm, dry and intact. No rashesnoted. HEENT: Head: normal shape and size; Eyes: sclera white, no icterus, conjunctiva pink, PERRLA and EOMs intact;  Neck:  Neck supple, trachea midline. No masses, lumps present. Thyromegaly noted. Cardiovascular: Normal rate and rhythm. S1,S2 noted.  No murmur, rubs or gallops noted. No JVD or BLE edema. No carotid bruits  noted. Pulmonary/Chest: Normal effort and positive vesicular breath sounds. No respiratory distress. No wheezes, rales or ronchi noted.  Abdomen: Soft and nontender. Normal bowel sounds. No distention or masses noted. Liver, spleen and kidneys non palpable. Musculoskeletal: Strength 5/5 BUE/BLE. No difficulty with gait.  Neurological: Alert and oriented. Cranial nerves II-XII grossly intact. Coordination normal.  Psychiatric: Mood and affect normal. Behavior is normal. Judgment and thought content normal.     BMET    Component Value Date/Time   NA 139 12/01/2018 2325   NA 140 02/28/2016 0900   NA 144 07/10/2011 1025   K 4.0 12/01/2018 2325   K 3.6 07/10/2011 1025   CL 107 12/01/2018 2325   CL 108 (H) 07/10/2011 1025   CO2 24 12/01/2018 2325   CO2 24 07/10/2011 1025   GLUCOSE 113 (H) 12/01/2018 2325   GLUCOSE 142 (H) 07/10/2011 1025   BUN 19 12/01/2018 2325   BUN 10 02/28/2016 0900   BUN 10 07/10/2011 1025   CREATININE 0.96 12/01/2018 2325   CREATININE 1.00 06/26/2018 1521   CALCIUM 9.2 12/01/2018 2325   CALCIUM 8.7 07/10/2011 1025   GFRNONAA >60 12/01/2018 2325   GFRNONAA >60 07/10/2011 1025   GFRAA >60 12/01/2018 2325   GFRAA >60 07/10/2011 1025    Lipid Panel     Component Value Date/Time   CHOL 206 (H) 06/26/2018 1521   CHOL 209 (H) 10/16/2017 1438   TRIG 414 (H) 06/26/2018 1521   HDL 42 (L) 06/26/2018 1521   HDL 42 10/16/2017 1438   CHOLHDL 4.9 06/26/2018 1521   VLDL 23.6 07/13/2013 1122   LDLCALC  06/26/2018 1521     Comment:     . LDL cholesterol not calculated. Triglyceride levels greater than 400 mg/dL invalidate calculated LDL results. . Reference range: <100 . Desirable range <100 mg/dL for primary prevention;   <70 mg/dL for patients with CHD or diabetic patients  with > or = 2 CHD risk factors. Marland Kitchen LDL-C is now calculated using the Martin-Hopkins  calculation, which is a validated novel method providing  better accuracy than the [employer]  equation in the  estimation of LDL-C.  Cresenciano Genre et al. Annamaria Helling. 3009;233(00): 2061-2068  (http://education.[facility].com/faq/FAQ164)     CBC    Component Value Date/Time   WBC 13.2 (H) 12/01/2018 2325   RBC 5.00 12/01/2018 2325   HGB 14.5 12/01/2018 2325   HGB 14.4 02/28/2016 0900   HCT 43.7 12/01/2018 2325   HCT 42.2 02/28/2016 0900   PLT 233 12/01/2018 2325   PLT 252 02/28/2016 0900   MCV 87.4 12/01/2018 2325   MCV 85 02/28/2016 0900   MCV 88 07/10/2011 1025   MCH 29.0 12/01/2018 2325   MCHC 33.2 12/01/2018 2325   RDW 12.5 12/01/2018 2325   RDW 13.2 02/28/2016 0900   RDW 12.6 07/10/2011 1025   LYMPHSABS 5.4 (H) 12/01/2018 2325   LYMPHSABS 3.6 (H) 02/28/2016 0900   MONOABS 0.8 12/01/2018 2325   EOSABS 0.2 12/01/2018 2325   EOSABS 0.3 02/28/2016 0900   BASOSABS 0.1 12/01/2018 2325   BASOSABS 0.1 02/28/2016 0900    Hgb A1C Lab Results  Component Value Date   HGBA1C 5.4 02/28/2016             Assessment & Plan:   Preventative Health Maintenance:  She declines ful shot Tetanus UTD Encouraged her to get a covid vaccine Pap smear UTD Mammogram overdue, she will schedule next year She declines referral to GI for colonoscopy at this time Encouraged her to consume a balanced diet and exercise regimen Advised her to see an eye doctor and dentist annually We will check CBC, C met, lipid, A1c and vitamin D today  Thyromegaly:  TSH today Consider thyroid ultrasound pending labs  LUQ Pain:  Will check amylase and lipase Consider abdominal ultrasound pending labs  RTC in 1 year, sooner if needed Webb Silversmith, NP This visit occurred during the SARS-CoV-2 public health emergency.  Safety protocols were in place, including screening questions prior to the visit, additional usage of staff PPE, and extensive cleaning of exam room while observing appropriate contact time as indicated for disinfecting solutions.

## 2020-01-18 NOTE — Assessment & Plan Note (Signed)
CMET and lipid profile today Encouraged her to consume a low fat diet 

## 2020-01-18 NOTE — Assessment & Plan Note (Signed)
Lamictal refilled today CMET and A1C today Support offered today

## 2020-01-18 NOTE — Patient Instructions (Signed)
Health Maintenance, Female Adopting a healthy lifestyle and getting preventive care are important in promoting health and wellness. Ask your health care provider about:  The right schedule for you to have regular tests and exams.  Things you can do on your own to prevent diseases and keep yourself healthy. What should I know about diet, weight, and exercise? Eat a healthy diet   Eat a diet that includes plenty of vegetables, fruits, low-fat dairy products, and lean protein.  Do not eat a lot of foods that are high in solid fats, added sugars, or sodium. Maintain a healthy weight Body mass index (BMI) is used to identify weight problems. It estimates body fat based on height and weight. Your health care provider can help determine your BMI and help you achieve or maintain a healthy weight. Get regular exercise Get regular exercise. This is one of the most important things you can do for your health. Most adults should:  Exercise for at least 150 minutes each week. The exercise should increase your heart rate and make you sweat (moderate-intensity exercise).  Do strengthening exercises at least twice a week. This is in addition to the moderate-intensity exercise.  Spend less time sitting. Even light physical activity can be beneficial. Watch cholesterol and blood lipids Have your blood tested for lipids and cholesterol at 50 years of age, then have this test every 5 years. Have your cholesterol levels checked more often if:  Your lipid or cholesterol levels are high.  You are older than 50 years of age.  You are at high risk for heart disease. What should I know about cancer screening? Depending on your health history and family history, you may need to have cancer screening at various ages. This may include screening for:  Breast cancer.  Cervical cancer.  Colorectal cancer.  Skin cancer.  Lung cancer. What should I know about heart disease, diabetes, and high blood  pressure? Blood pressure and heart disease  High blood pressure causes heart disease and increases the risk of stroke. This is more likely to develop in people who have high blood pressure readings, are of African descent, or are overweight.  Have your blood pressure checked: ? Every 3-5 years if you are 18-39 years of age. ? Every year if you are 40 years old or older. Diabetes Have regular diabetes screenings. This checks your fasting blood sugar level. Have the screening done:  Once every three years after age 40 if you are at a normal weight and have a low risk for diabetes.  More often and at a younger age if you are overweight or have a high risk for diabetes. What should I know about preventing infection? Hepatitis B If you have a higher risk for hepatitis B, you should be screened for this virus. Talk with your health care provider to find out if you are at risk for hepatitis B infection. Hepatitis C Testing is recommended for:  Everyone born from 1945 through 1965.  Anyone with known risk factors for hepatitis C. Sexually transmitted infections (STIs)  Get screened for STIs, including gonorrhea and chlamydia, if: ? You are sexually active and are younger than 50 years of age. ? You are older than 50 years of age and your health care provider tells you that you are at risk for this type of infection. ? Your sexual activity has changed since you were last screened, and you are at increased risk for chlamydia or gonorrhea. Ask your health care provider if   you are at risk.  Ask your health care provider about whether you are at high risk for HIV. Your health care provider may recommend a prescription medicine to help prevent HIV infection. If you choose to take medicine to prevent HIV, you should first get tested for HIV. You should then be tested every 3 months for as long as you are taking the medicine. Pregnancy  If you are about to stop having your period (premenopausal) and  you may become pregnant, seek counseling before you get pregnant.  Take 400 to 800 micrograms (mcg) of folic acid every day if you become pregnant.  Ask for birth control (contraception) if you want to prevent pregnancy. Osteoporosis and menopause Osteoporosis is a disease in which the bones lose minerals and strength with aging. This can result in bone fractures. If you are 65 years old or older, or if you are at risk for osteoporosis and fractures, ask your health care provider if you should:  Be screened for bone loss.  Take a calcium or vitamin D supplement to lower your risk of fractures.  Be given hormone replacement therapy (HRT) to treat symptoms of menopause. Follow these instructions at home: Lifestyle  Do not use any products that contain nicotine or tobacco, such as cigarettes, e-cigarettes, and chewing tobacco. If you need help quitting, ask your health care provider.  Do not use street drugs.  Do not share needles.  Ask your health care provider for help if you need support or information about quitting drugs. Alcohol use  Do not drink alcohol if: ? Your health care provider tells you not to drink. ? You are pregnant, may be pregnant, or are planning to become pregnant.  If you drink alcohol: ? Limit how much you use to 0-1 drink a day. ? Limit intake if you are breastfeeding.  Be aware of how much alcohol is in your drink. In the [place] one drink equals one 12 oz bottle of beer (355 mL), one 5 oz glass of wine (148 mL), or one 1 oz glass of hard liquor (44 mL). General instructions  Schedule regular health, dental, and eye exams.  Stay current with your vaccines.  Tell your health care provider if: ? You often feel depressed. ? You have ever been abused or do not feel safe at home. Summary  Adopting a healthy lifestyle and getting preventive care are important in promoting health and wellness.  Follow your health care provider's instructions about healthy  diet, exercising, and getting tested or screened for diseases.  Follow your health care provider's instructions on monitoring your cholesterol and blood pressure. This information is not intended to replace advice given to you by your health care provider. Make sure you discuss any questions you have with your health care provider. Document Revised: 04/01/2018 Document Reviewed: 04/01/2018 Elsevier Patient Education  2020 [employer]vier Inc.  

## 2020-01-19 ENCOUNTER — Encounter: Payer: Self-pay | Admitting: Internal Medicine

## 2020-01-19 LAB — CBC
HCT: 46.5 % — ABNORMAL HIGH (ref 36.0–46.0)
Hemoglobin: 15.4 g/dL — ABNORMAL HIGH (ref 12.0–15.0)
MCHC: 33.1 g/dL (ref 30.0–36.0)
MCV: 89.1 fl (ref 78.0–100.0)
Platelets: 219 10*3/uL (ref 150.0–400.0)
RBC: 5.21 Mil/uL — ABNORMAL HIGH (ref 3.87–5.11)
RDW: 13.5 % (ref 11.5–15.5)
WBC: 11.9 10*3/uL — ABNORMAL HIGH (ref 4.0–10.5)

## 2020-01-19 LAB — LIPID PANEL
Cholesterol: 217 mg/dL — ABNORMAL HIGH (ref 0–200)
HDL: 43.5 mg/dL (ref >39.00)
NonHDL: 173.18
Total CHOL/HDL Ratio: 5
Triglycerides: 227 mg/dL — ABNORMAL HIGH (ref 0.0–149.0)
VLDL: 45.4 mg/dL — ABNORMAL HIGH (ref 0.0–40.0)

## 2020-01-19 LAB — LDL CHOLESTEROL, DIRECT: Direct LDL: 145 mg/dL

## 2020-01-19 LAB — AMYLASE: Amylase: 51 U/L (ref 27–131)

## 2020-01-19 LAB — COMPREHENSIVE METABOLIC PANEL
ALT: 21 U/L (ref 0–35)
AST: 18 U/L (ref 0–37)
Albumin: 4.5 g/dL (ref 3.5–5.2)
Alkaline Phosphatase: 60 U/L (ref 39–117)
BUN: 9 mg/dL (ref 6–23)
CO2: 29 mEq/L (ref 19–32)
Calcium: 9.7 mg/dL (ref 8.4–10.5)
Chloride: 105 mEq/L (ref 96–112)
Creatinine, Ser: 0.97 mg/dL (ref 0.40–1.20)
GFR: 60.71 mL/min (ref >60.00)
Glucose, Bld: 83 mg/dL (ref 70–99)
Potassium: 4.5 mEq/L (ref 3.5–5.1)
Sodium: 141 mEq/L (ref 135–145)
Total Bilirubin: 0.5 mg/dL (ref 0.2–1.2)
Total Protein: 7.1 g/dL (ref 6.0–8.3)

## 2020-01-19 LAB — TSH: TSH: 1 u[IU]/mL (ref 0.35–4.50)

## 2020-01-19 LAB — VITAMIN D 25 HYDROXY (VIT D DEFICIENCY, FRACTURES): VITD: 46.94 ng/mL (ref 30.00–100.00)

## 2020-01-19 LAB — LIPASE: Lipase: 74 U/L — ABNORMAL HIGH (ref 11.0–59.0)

## 2020-01-19 LAB — HEMOGLOBIN A1C: Hgb A1c MFr Bld: 5.9 % (ref 4.6–6.5)

## 2020-01-24 NOTE — Addendum Note (Signed)
Addended by: Lorre Munroe on: 01/24/2020 01:51 PM   Modules accepted: Orders

## 2020-01-27 NOTE — Addendum Note (Signed)
Addended by: Roena Malady on: 01/27/2020 12:37 PM   Modules accepted: Orders

## 2020-01-27 NOTE — Addendum Note (Signed)
Addended by: Roena Malady on: 01/27/2020 12:51 PM   Modules accepted: Orders

## 2020-02-07 ENCOUNTER — Other Ambulatory Visit: Payer: Self-pay

## 2020-02-07 ENCOUNTER — Ambulatory Visit
Admission: RE | Admit: 2020-02-07 | Discharge: 2020-02-07 | Disposition: A | Discharge status: Home / Self Care | Payer: Managed Care, Other (non HMO) | Source: Ambulatory Visit | Attending: Internal Medicine | Admitting: Internal Medicine

## 2020-02-07 DIAGNOSIS — R1012 Left upper quadrant pain: Secondary | ICD-10-CM | POA: Diagnosis not present

## 2020-02-07 DIAGNOSIS — R748 Abnormal levels of other serum enzymes: Secondary | ICD-10-CM

## 2020-02-07 HISTORY — DX: Unspecified asthma, uncomplicated: J45.909

## 2020-02-07 MED ORDER — IOHEXOL 300 MG/ML  SOLN
100.0000 mL | Freq: Once | INTRAMUSCULAR | Status: AC | PRN
  Administered 2020-02-07: 100 mL via INTRAVENOUS

## 2020-02-08 ENCOUNTER — Encounter: Payer: Self-pay | Admitting: Internal Medicine

## 2020-02-08 MED ORDER — CIPROFLOXACIN HCL 500 MG PO TABS: 500.0000 mg | ORAL_TABLET | Freq: Two times a day (BID) | ORAL | 0 refills | Status: DC

## 2020-02-08 MED ORDER — METRONIDAZOLE 500 MG PO TABS: 500.0000 mg | ORAL_TABLET | Freq: Three times a day (TID) | ORAL | 0 refills | Status: DC

## 2020-02-16 ENCOUNTER — Telehealth: Payer: Self-pay

## 2020-02-16 NOTE — Telephone Encounter (Signed)
Form faxed, copy sent to scan, original mailed to pt per her request

## 2020-02-16 NOTE — Telephone Encounter (Signed)
Placed in your box for signature  

## 2020-02-16 NOTE — Telephone Encounter (Signed)
Done, given back to MYD 

## 2020-03-13 ENCOUNTER — Telehealth: Payer: Self-pay

## 2020-03-13 NOTE — Telephone Encounter (Signed)
DOS 03/24/2020  [place] BUNIONECTOMY B/L - 24799  [employer] EFFECTIVE DATE - 04/23/2015  PLAN DEDUCTIBLE - $1500.00 W/ $1500.00 MET OUT OF POCKET - $4000.00 W/ $2945.13 MET COPAY $0.00 COINSURANCE - 80%  PER AUTOMATED SYSTEM, NO PRECERT REQUIRED FOR CPT 28296. CONF # [id number]

## 2020-03-23 ENCOUNTER — Other Ambulatory Visit: Payer: Self-pay | Admitting: Podiatry

## 2020-03-23 MED ORDER — OXYCODONE-ACETAMINOPHEN 10-325 MG PO TABS: 1.0000 | ORAL_TABLET | Freq: Three times a day (TID) | ORAL | 0 refills | Status: AC | PRN

## 2020-03-23 MED ORDER — ONDANSETRON HCL 4 MG PO TABS: 4.0000 mg | ORAL_TABLET | Freq: Three times a day (TID) | ORAL | 0 refills | Status: DC | PRN

## 2020-03-23 MED ORDER — CLINDAMYCIN HCL 150 MG PO CAPS: 150.0000 mg | ORAL_CAPSULE | Freq: Three times a day (TID) | ORAL | 0 refills | Status: DC

## 2020-03-24 DIAGNOSIS — M2012 Hallux valgus (acquired), left foot: Secondary | ICD-10-CM | POA: Diagnosis not present

## 2020-03-24 DIAGNOSIS — M2011 Hallux valgus (acquired), right foot: Secondary | ICD-10-CM | POA: Diagnosis not present

## 2020-03-27 DIAGNOSIS — M79676 Pain in unspecified toe(s): Secondary | ICD-10-CM

## 2020-03-29 ENCOUNTER — Ambulatory Visit (INDEPENDENT_AMBULATORY_CARE_PROVIDER_SITE_OTHER): Payer: Managed Care, Other (non HMO)

## 2020-03-29 ENCOUNTER — Ambulatory Visit (INDEPENDENT_AMBULATORY_CARE_PROVIDER_SITE_OTHER): Payer: Managed Care, Other (non HMO) | Admitting: Podiatry

## 2020-03-29 ENCOUNTER — Other Ambulatory Visit: Payer: Self-pay

## 2020-03-29 ENCOUNTER — Encounter: Payer: Self-pay | Admitting: Podiatry

## 2020-03-29 DIAGNOSIS — Z9889 Other specified postprocedural states: Secondary | ICD-10-CM

## 2020-03-29 DIAGNOSIS — M2012 Hallux valgus (acquired), left foot: Secondary | ICD-10-CM | POA: Diagnosis not present

## 2020-03-29 DIAGNOSIS — M2010 Hallux valgus (acquired), unspecified foot: Secondary | ICD-10-CM

## 2020-03-29 DIAGNOSIS — M2011 Hallux valgus (acquired), right foot: Secondary | ICD-10-CM | POA: Diagnosis not present

## 2020-03-29 MED ORDER — OXYCODONE-ACETAMINOPHEN 10-325 MG PO TABS
1.0000 | ORAL_TABLET | Freq: Three times a day (TID) | ORAL | 0 refills | Status: AC | PRN
Start: 2020-03-29 — End: 2020-04-05

## 2020-03-29 NOTE — Progress Notes (Signed)
She presents today date of surgery 03/24/2020 status post [facility] bunionectomy bilaterally.  She denies fever chills nausea vomiting muscle aches and pain states that they are exquisitely painful.  Objective: Vital signs are stable alert oriented x3 presents today in obvious antalgic gait with bilateral boots.  Was removed demonstrates dry sterile dressing intact was removed demonstrates moderate edema bilateral forefoot incision intact and appears to be healing well she has good range of motion of the first metatarsophalangeal joints bilateral passively and actively.  Radiographs taken today demonstrate capital osteotomy with internal fixation and dorsal forefoot edema is noted.  Internal fixation appears to be intact the joints appear to be congruous.  Assessment: Well-healing surgical foot.  Plan: Redressed today dressed a compressive dressing prescribed more for pain medication and I will follow-up with her in 1 week for suture removal.

## 2020-04-05 ENCOUNTER — Encounter: Payer: Self-pay | Admitting: Podiatry

## 2020-04-05 ENCOUNTER — Ambulatory Visit (INDEPENDENT_AMBULATORY_CARE_PROVIDER_SITE_OTHER): Payer: Managed Care, Other (non HMO) | Admitting: Podiatry

## 2020-04-05 ENCOUNTER — Other Ambulatory Visit: Payer: Self-pay

## 2020-04-05 DIAGNOSIS — M2010 Hallux valgus (acquired), unspecified foot: Secondary | ICD-10-CM

## 2020-04-05 DIAGNOSIS — Z9889 Other specified postprocedural states: Secondary | ICD-10-CM

## 2020-04-05 NOTE — Progress Notes (Signed)
She presents today for her second postop visit date of surgery was 03/24/2020 [facility] bunionectomy bilaterally.  States that the toes just ache to some degree.  Objective: Dry sterile dressing intact and it appears to have been changed utilizing just Telfa and Coban.  This is not the dressing output on the last time I saw her.  Once removed demonstrates sutures are intact margins are well coapted I removed the sutures today margins remain well coapted there is no signs of infection.  She has good dorsiflexion and plantarflexion of the first metatarsophalangeal joint.  Assessment: Well-healing osteotomies first metatarsals bilateral x2 weeks.  Plan: Placed her in a compression anklet today and Darco shoes.  I will follow-up with her in 2 weeks.  Encouraged range of motion exercises ongoing allow her to start getting this wet she understands that she has to be very careful with this any questions or concerns any problems she will notify us immediately.

## 2020-04-10 ENCOUNTER — Other Ambulatory Visit: Payer: Self-pay | Admitting: Podiatry

## 2020-04-10 NOTE — Telephone Encounter (Signed)
Please advise 

## 2020-04-11 ENCOUNTER — Encounter: Payer: Self-pay | Admitting: Podiatry

## 2020-04-18 ENCOUNTER — Encounter: Payer: Self-pay | Admitting: Internal Medicine

## 2020-04-19 ENCOUNTER — Encounter: Payer: Managed Care, Other (non HMO) | Admitting: Podiatry

## 2020-04-19 ENCOUNTER — Encounter: Payer: Self-pay | Admitting: Internal Medicine

## 2020-04-19 ENCOUNTER — Telehealth (INDEPENDENT_AMBULATORY_CARE_PROVIDER_SITE_OTHER): Payer: Managed Care, Other (non HMO) | Admitting: Internal Medicine

## 2020-04-19 ENCOUNTER — Other Ambulatory Visit: Payer: Self-pay

## 2020-04-19 DIAGNOSIS — R339 Retention of urine, unspecified: Secondary | ICD-10-CM

## 2020-04-19 DIAGNOSIS — R3911 Hesitancy of micturition: Secondary | ICD-10-CM

## 2020-04-19 DIAGNOSIS — R197 Diarrhea, unspecified: Secondary | ICD-10-CM

## 2020-04-19 DIAGNOSIS — R059 Cough, unspecified: Secondary | ICD-10-CM

## 2020-04-19 DIAGNOSIS — R310 Gross hematuria: Secondary | ICD-10-CM

## 2020-04-19 DIAGNOSIS — R0602 Shortness of breath: Secondary | ICD-10-CM

## 2020-04-19 LAB — POC URINALSYSI DIPSTICK (AUTOMATED)
Blood, UA: NEGATIVE
Glucose, UA: POSITIVE — AB
Ketones, UA: NEGATIVE
Nitrite, UA: POSITIVE
Protein, UA: POSITIVE — AB
Spec Grav, UA: >=1.03 — AB (ref 1.010–1.025)
Urobilinogen, UA: 1 E.U./dL
pH, UA: 5.5 (ref 5.0–8.0)

## 2020-04-19 MED ORDER — HYDROCOD POLST-CPM POLST ER 10-8 MG/5ML PO SUER: 5.0000 mL | Freq: Every evening | ORAL | 0 refills | Status: DC | PRN

## 2020-04-19 MED ORDER — PREDNISONE 10 MG PO TABS: ORAL_TABLET | ORAL | 0 refills | Status: DC

## 2020-04-19 MED ORDER — NITROFURANTOIN MONOHYD MACRO 100 MG PO CAPS: 100.0000 mg | ORAL_CAPSULE | Freq: Two times a day (BID) | ORAL | 0 refills | Status: DC

## 2020-04-19 NOTE — Progress Notes (Signed)
Virtual Visit via Video Note  I connected with Tasha Miller on 04/19/20 at  1:30 PM EST by a video enabled telemedicine application and verified that I am speaking with the correct person using two identifiers.  Location: Patient: Home Provider: Office  Persons participating in this video call: Nicki Reaper, NP-C and Tasha Miller   I discussed the limitations of evaluation and management by telemedicine and the availability of in person appointments. The patient expressed understanding and agreed to proceed.  History of Present Illness:  Patient reports fever cough, shortness of breath and diarrhea.  She reports this started Monday.  The cough is productive of clear mucus.  She denies headaches, dizziness, runny nose, nasal congestion, ear pain, loss of taste/smell or sore throat.  She denies nausea or vomiting.  The diarrhea has improved.  She has been taking Tylenol and her breathing treatments with minimal relief of symptoms.  She has had sick contacts with similar symptoms.  She has had a negative Covid test.  She has not had her Covid vaccine.  She also reports blood in her urine.  She noticed this yesterday morning when she woke up.  She reports associated urinary hesitancy and retention.  She has had some low back pain but is not sure if this is from recent bilateral foot surgery where she has not been as ambulatory as normal.  She denies vaginal issues.  She does have a history of kidney stones and is unsure if this feels the same.  She has taken AZO OTC with minimal relief of symptoms.    Past Medical History:  Diagnosis Date  . Asthma   . PONV (postoperative nausea and vomiting)   . Rubella   . SVT (supraventricular tachycardia) (HCC)     Current Outpatient Medications  Medication Sig Dispense Refill  . albuterol (VENTOLIN HFA) 108 (90 Base) MCG/ACT inhaler Inhale 2 puffs into the lungs as needed for wheezing or shortness of breath. 1 Inhaler 2  . clindamycin (CLEOCIN) 150 MG  capsule Take 1 capsule (150 mg total) by mouth 3 (three) times daily. 30 capsule 0  . cyanocobalamin (,VITAMIN B-12,) 1000 MCG/ML injection Inject 1,000 mcg into the muscle every 14 (fourteen) days.    Marland Kitchen estradiol (ESTRACE) 1 MG tablet     . FLOVENT HFA 220 MCG/ACT inhaler TAKE 2 PUFFS BY MOUTH TWICE A DAY 36 Inhaler 1  . INTRAROSA 6.5 MG INST INSERT 1 APPLICATORFUL VAGINALLY EVERY DAY AT NIGHT    . ipratropium-albuterol (DUONEB) 0.5-2.5 (3) MG/3ML SOLN Take 3 mLs by nebulization every 4 (four) hours as needed. (Patient taking differently: Take 3 mLs by nebulization as needed. ) 360 mL 1  . lamoTRIgine (LAMICTAL) 25 MG tablet Take 3 tablets (75 mg total) by mouth daily. 270 tablet 3  . meloxicam (MOBIC) 15 MG tablet Take 1 tablet (15 mg total) by mouth daily. 30 tablet 3  . metroNIDAZOLE (FLAGYL) 500 MG tablet Take 1 tablet (500 mg total) by mouth 3 (three) times daily. 21 tablet 0  . ondansetron (ZOFRAN) 4 MG tablet Take 1 tablet (4 mg total) by mouth every 8 (eight) hours as needed. 20 tablet 0  . progesterone (PROMETRIUM) 100 MG capsule Take 100 mg by mouth at bedtime.    Marland Kitchen testosterone cypionate (DEPO-TESTOSTERONE) 100 MG/ML injection Depo-Testosterone 100 mg/mL intramuscular oil  Inject 0.5 mL every 4 weeks by intramuscular route.    . traMADol (ULTRAM) 50 MG tablet TAKE 1 TABLET (50 MG TOTAL) BY MOUTH EVERY  8 (EIGHT) HOURS AS NEEDED. 30 tablet 0  . triamcinolone cream (KENALOG) 0.1 % APPLY TO AFFECTED AREA OF SKIN TWICE A DAY AS NEEDED TAPER USE AS ABLE    . Vitamin D, Ergocalciferol, (DRISDOL) 1.25 MG (50000 UT) CAPS capsule TAKE 1 CAPSULE BY MOUTH WEEKLY     No current facility-administered medications for this visit.    Allergies  Allergen Reactions  . Latex Itching and Swelling  . Sulfa Antibiotics Itching and Nausea And Vomiting  . Amoxicillin-Pot Clavulanate   . Sulfonamide Derivatives Nausea And Vomiting    Family History  Problem Relation Age of Onset  . Cancer Maternal  Aunt        thyroid cancer  . Cancer Maternal Uncle   . Diabetes Paternal Aunt   . Heart disease Neg Hx   . Stroke Neg Hx   . Breast cancer Neg Hx     Social History   Socioeconomic History  . Marital status: Media planner    Spouse name: Not on file  . Number of children: Not on file  . Years of education: Not on file  . Highest education level: Not on file  Occupational History  . Not on file  Tobacco Use  . Smoking status: Never Smoker  . Smokeless tobacco: Never Used  Vaping Use  . Vaping Use: Never used  Substance and Sexual Activity  . Alcohol use: No  . Drug use: No  . Sexual activity: Yes  Other Topics Concern  . Not on file  Social History Narrative  . Not on file   Social Determinants of Health   Financial Resource Strain: Not on file  Food Insecurity: Not on file  Transportation Needs: Not on file  Physical Activity: Not on file  Stress: Not on file  Social Connections: Not on file  Intimate Partner Violence: Not on file     Constitutional: Patient reports fever.  Denies malaise, fatigue, headache or abrupt weight changes.  HEENT: Denies eye pain, eye redness, ear pain, ringing in the ears, wax buildup, runny nose, nasal congestion, bloody nose, or sore throat. Respiratory: Patient reports cough and shortness of breath.  Denies difficulty breathing.   Cardiovascular: Denies chest pain, chest tightness, palpitations or swelling in the hands or feet.  Gastrointestinal: Patient reports diarrhea.  Denies abdominal pain, bloating, constipation, or blood in the stool.  GU: Patient reports urinary hesitancy, urinary retention and blood in urine.  Denies urgency, frequency, pain with urination, burning sensation, odor or discharge. Musculoskeletal: Patient reports bilateral low back pain.  Denies decrease in range of motion, difficulty with gait, or joint pain and swelling.  Skin: Denies redness, rashes, lesions or ulcercations.    No other specific  complaints in a complete review of systems (except as listed in HPI above).  Observations/Objective:   Wt Readings from Last 3 Encounters:  01/18/20 147 lb (66.7 kg)  12/01/18 138 lb (62.6 kg)  07/23/18 142 lb 8 oz (64.6 kg)    General: Appears her stated age, ill-appearing but in NAD. HEENT: Head: normal shape and size;  Nose: Slight congestion noted; Throat/Mouth: Hoarseness noted Pulmonary/Chest: Normal effort.  Mildly SOB but no respiratory distress. Neurological: Alert and oriented.   BMET    Component Value Date/Time   NA 141 01/18/2020 1542   NA 140 02/28/2016 0900   NA 144 07/10/2011 1025   K 4.5 01/18/2020 1542   K 3.6 07/10/2011 1025   CL 105 01/18/2020 1542   CL 108 (H) 07/10/2011  1025   CO2 29 01/18/2020 1542   CO2 24 07/10/2011 1025   GLUCOSE 83 01/18/2020 1542   GLUCOSE 142 (H) 07/10/2011 1025   BUN 9 01/18/2020 1542   BUN 10 02/28/2016 0900   BUN 10 07/10/2011 1025   CREATININE 0.97 01/18/2020 1542   CREATININE 1.00 06/26/2018 1521   CALCIUM 9.7 01/18/2020 1542   CALCIUM 8.7 07/10/2011 1025   GFRNONAA >60 12/01/2018 2325   GFRNONAA >60 07/10/2011 1025   GFRAA >60 12/01/2018 2325   GFRAA >60 07/10/2011 1025    Lipid Panel     Component Value Date/Time   CHOL 217 (H) 01/18/2020 1542   CHOL 209 (H) 10/16/2017 1438   TRIG 227.0 (H) 01/18/2020 1542   HDL 43.50 01/18/2020 1542   HDL 42 10/16/2017 1438   CHOLHDL 5 01/18/2020 1542   VLDL 45.4 (H) 01/18/2020 1542   LDLCALC  06/26/2018 1521     Comment:     . LDL cholesterol not calculated. Triglyceride levels greater than 400 mg/dL invalidate calculated LDL results. . Reference range: <100 . Desirable range <100 mg/dL for primary prevention;   <70 mg/dL for patients with CHD or diabetic patients  with > or = 2 CHD risk factors. Marland Kitchen LDL-C is now calculated using the Martin-Hopkins  calculation, which is a validated novel method providing  better accuracy than the Friedewald equation in the   estimation of LDL-C.  Horald Pollen et al. Lenox Ahr. 5462;703(50): 2061-2068  (http://education.[facility].com/faq/FAQ164)     CBC    Component Value Date/Time   WBC 11.9 (H) 01/18/2020 1542   RBC 5.21 (H) 01/18/2020 1542   HGB 15.4 (H) 01/18/2020 1542   HGB 14.4 02/28/2016 0900   HCT 46.5 (H) 01/18/2020 1542   HCT 42.2 02/28/2016 0900   PLT 219.0 01/18/2020 1542   PLT 252 02/28/2016 0900   MCV 89.1 01/18/2020 1542   MCV 85 02/28/2016 0900   MCV 88 07/10/2011 1025   MCH 29.0 12/01/2018 2325   MCHC 33.1 01/18/2020 1542   RDW 13.5 01/18/2020 1542   RDW 13.2 02/28/2016 0900   RDW 12.6 07/10/2011 1025   LYMPHSABS 5.4 (H) 12/01/2018 2325   LYMPHSABS 3.6 (H) 02/28/2016 0900   MONOABS 0.8 12/01/2018 2325   EOSABS 0.2 12/01/2018 2325   EOSABS 0.3 02/28/2016 0900   BASOSABS 0.1 12/01/2018 2325   BASOSABS 0.1 02/28/2016 0900    Hgb A1C Lab Results  Component Value Date   HGBA1C 5.9 01/18/2020       Assessment and Plan:  Urinary Hesitancy, Urinary Retention and Hematuria:  Hematuria: Azo interference We will send urine culture Push fluids Continue AZO OTC Given current symptoms and the fact that we are closed on Friday, will give Rx for Macrobid 100 mg p.o. BID x5 days  Cough, Shortness of Breath, Diarrhea:  DDx includes viral illness, influenza, Covid It could be that she did her Covid test too soon, recommend repeating this if symptoms persist Rx for Pred taper x6 days Rx for Tussionex for cough Continue breathing treatments Encourage masking, frequent handwashing, social distancing and self quarantine until symptoms resolve  Return precautions discussed  Follow Up Instructions:    I discussed the assessment and treatment plan with the patient. The patient was provided an opportunity to ask questions and all were answered. The patient agreed with the plan and demonstrated an understanding of the instructions.   The patient was advised to call back or seek  an in-person evaluation if the symptoms worsen or  if the condition fails to improve as anticipated.    Webb Silversmith, NP

## 2020-04-19 NOTE — Patient Instructions (Signed)
Viral Illness, Adult Viruses are tiny germs that can get into a person's body and cause illness. There are many different types of viruses, and they cause many types of illness. Viral illnesses can range from mild to severe. They can affect various parts of the body. Common illnesses that are caused by a virus include colds and the flu. Viral illnesses also include serious conditions such as HIV/AIDS (human immunodeficiency virus/acquired immunodeficiency syndrome). A few viruses have been linked to certain cancers. What are the causes? Many types of viruses can cause illness. Viruses invade cells in your body, multiply, and cause the infected cells to malfunction or die. When the cell dies, it releases more of the virus. When this happens, you develop symptoms of the illness, and the virus continues to spread to other cells. If the virus takes over the function of the cell, it can cause the cell to divide and grow out of control, as is the case when a virus causes cancer. Different viruses get into the body in different ways. You can get a virus by:  Swallowing food or water that is contaminated with the virus.  Breathing in droplets that have been coughed or sneezed into the air by an infected person.  Touching a surface that has been contaminated with the virus and then touching your eyes, nose, or mouth.  Being bitten by an insect or animal that carries the virus.  Having sexual contact with a person who is infected with the virus.  Being exposed to blood or fluids that contain the virus, either through an open cut or during a transfusion. If a virus enters your body, your body's defense system (immune system) will try to fight the virus. You may be at higher risk for a viral illness if your immune system is weak. What are the signs or symptoms? Symptoms vary depending on the type of virus and the location of the cells that it invades. Common symptoms of the main types of viral illnesses  include: Cold and flu viruses  Fever.  Headache.  Sore throat.  Muscle aches.  Nasal congestion.  Cough. Digestive system (gastrointestinal) viruses  Fever.  Abdominal pain.  Nausea.  Diarrhea. Liver viruses (hepatitis)  Loss of appetite.  Tiredness.  Yellowing of the skin (jaundice). Brain and spinal cord viruses  Fever.  Headache.  Stiff neck.  Nausea and vomiting.  Confusion or sleepiness. Skin viruses  Warts.  Itching.  Rash. Sexually transmitted viruses  Discharge.  Swelling.  Redness.  Rash. How is this treated? Viruses can be difficult to treat because they live within cells. Antibiotic medicines do not treat viruses because these drugs do not get inside cells. Treatment for a viral illness may include:  Resting and drinking plenty of fluids.  Medicines to relieve symptoms. These can include over-the-counter medicine for pain and fever, medicines for cough or congestion, and medicines to relieve diarrhea.  Antiviral medicines. These drugs are available only for certain types of viruses. They may help reduce flu symptoms if taken early. There are also many antiviral medicines for hepatitis and HIV/AIDS. Some viral illnesses can be prevented with vaccinations. A common example is the flu shot. Follow these instructions at home: Medicines   Take over-the-counter and prescription medicines only as told by your health care provider.  If you were prescribed an antiviral medicine, take it as told by your health care provider. Do not stop taking the medicine even if you start to feel better.  Be aware of when   antibiotics are needed and when they are not needed. Antibiotics do not treat viruses. If your health care provider thinks that you may have a bacterial infection as well as a viral infection, you may get an antibiotic. ? Do not ask for an antibiotic prescription if you have been diagnosed with a viral illness. That will not make your  illness go away faster. ? Frequently taking antibiotics when they are not needed can lead to antibiotic resistance. When this develops, the medicine no longer works against the bacteria that it normally fights. General instructions  Drink enough fluids to keep your urine clear or pale yellow.  Rest as much as possible.  Return to your normal activities as told by your health care provider. Ask your health care provider what activities are safe for you.  Keep all follow-up visits as told by your health care provider. This is important. How is this prevented? Take these actions to reduce your risk of viral infection:  Eat a healthy diet and get enough rest.  Wash your hands often with soap and water. This is especially important when you are in public places. If soap and water are not available, use hand sanitizer.  Avoid close contact with friends and family who have a viral illness.  If you travel to areas where viral gastrointestinal infection is common, avoid drinking water or eating raw food.  Keep your immunizations up to date. Get a flu shot every year as told by your health care provider.  Do not share toothbrushes, nail clippers, razors, or needles with other people.  Always practice safe sex.  Contact a health care provider if:  You have symptoms of a viral illness that do not go away.  Your symptoms come back after going away.  Your symptoms get worse. Get help right away if:  You have trouble breathing.  You have a severe headache or a stiff neck.  You have severe vomiting or abdominal pain. This information is not intended to replace advice given to you by your health care provider. Make sure you discuss any questions you have with your health care provider. Document Revised: 03/21/2017 Document Reviewed: 08/18/2015 Elsevier Patient Education  2020 Elsevier Inc.  

## 2020-04-19 NOTE — Addendum Note (Signed)
Addended by: Roena Malady on: 04/19/2020 04:23 PM   Modules accepted: Orders

## 2020-04-20 LAB — URINE CULTURE
MICRO NUMBER:: 11366446
Result:: NO GROWTH
SPECIMEN QUALITY:: ADEQUATE

## 2020-04-22 ENCOUNTER — Encounter: Payer: Self-pay | Admitting: Internal Medicine

## 2020-04-24 MED ORDER — ALBUTEROL SULFATE HFA 108 (90 BASE) MCG/ACT IN AERS: 2.0000 | INHALATION_SPRAY | RESPIRATORY_TRACT | 2 refills | Status: AC | PRN

## 2020-04-24 MED ORDER — AZITHROMYCIN 250 MG PO TABS: ORAL_TABLET | ORAL | 0 refills | Status: DC

## 2020-04-24 MED ORDER — FLOVENT HFA 220 MCG/ACT IN AERO: INHALATION_SPRAY | RESPIRATORY_TRACT | 1 refills | Status: DC

## 2020-04-24 MED ORDER — IPRATROPIUM-ALBUTEROL 0.5-2.5 (3) MG/3ML IN SOLN
3.0000 mL | RESPIRATORY_TRACT | 0 refills | Status: DC | PRN
Start: 2020-04-24 — End: 2020-09-06

## 2020-04-24 NOTE — Addendum Note (Signed)
Addended by: Lorre Munroe on: 04/24/2020 07:23 PM   Modules accepted: Orders

## 2020-05-03 ENCOUNTER — Other Ambulatory Visit: Payer: Self-pay

## 2020-05-03 ENCOUNTER — Encounter: Payer: Self-pay | Admitting: Podiatry

## 2020-05-03 ENCOUNTER — Ambulatory Visit (INDEPENDENT_AMBULATORY_CARE_PROVIDER_SITE_OTHER): Payer: Managed Care, Other (non HMO)

## 2020-05-03 ENCOUNTER — Ambulatory Visit (INDEPENDENT_AMBULATORY_CARE_PROVIDER_SITE_OTHER): Payer: Managed Care, Other (non HMO) | Admitting: Podiatry

## 2020-05-03 DIAGNOSIS — M2011 Hallux valgus (acquired), right foot: Secondary | ICD-10-CM

## 2020-05-03 DIAGNOSIS — M2012 Hallux valgus (acquired), left foot: Secondary | ICD-10-CM

## 2020-05-03 NOTE — Progress Notes (Signed)
She presents today she is nearly 6 weeks status post [place] bunionectomy bilaterally.  She states that the right one is still more swollen and stiff than the left 1 but it seems to be coming along.  Objective: Vital signs are stable she is alert and oriented x3.  Mild edema right over left though there is some edema present there as well.  She has good dorsiflexion plantarflexion left foot Limited plantar flexion on the right foot.  Radiographs taken today demonstrate rectus foot soft tissue swelling overlying the first metatarsal phalangeal joint area internal fixation is in good position and intact.  It does appear that the capital osteotomy has slid proximally just a little bit on the right first metatarsal which could be from overuse failure of internal fixation or too much ambulation.  Assessment: Well-healing surgical foot bilateral left greater than right.  Plan: We will have her back into some regular shoe gear to increase her range of motion I will follow-up with her in about 3 weeks.

## 2020-05-16 ENCOUNTER — Encounter: Payer: Self-pay | Admitting: Internal Medicine

## 2020-05-17 ENCOUNTER — Other Ambulatory Visit: Payer: Self-pay

## 2020-05-17 ENCOUNTER — Ambulatory Visit (INDEPENDENT_AMBULATORY_CARE_PROVIDER_SITE_OTHER): Payer: Managed Care, Other (non HMO) | Admitting: Internal Medicine

## 2020-05-17 ENCOUNTER — Encounter: Payer: Self-pay | Admitting: Internal Medicine

## 2020-05-17 DIAGNOSIS — R310 Gross hematuria: Secondary | ICD-10-CM | POA: Diagnosis not present

## 2020-05-17 LAB — RENAL FUNCTION PANEL
Albumin: 4.1 g/dL (ref 3.5–5.2)
BUN: 8 mg/dL (ref 6–23)
CO2: 30 mEq/L (ref 19–32)
Calcium: 9.3 mg/dL (ref 8.4–10.5)
Chloride: 106 mEq/L (ref 96–112)
Creatinine, Ser: 0.87 mg/dL (ref 0.40–1.20)
GFR: 77.56 mL/min (ref >60.00)
Glucose, Bld: 96 mg/dL (ref 70–99)
Phosphorus: 2.6 mg/dL (ref 2.3–4.6)
Potassium: 4.1 mEq/L (ref 3.5–5.1)
Sodium: 141 mEq/L (ref 135–145)

## 2020-05-17 LAB — POC URINALSYSI DIPSTICK (AUTOMATED)
Bilirubin, UA: NEGATIVE
Glucose, UA: NEGATIVE
Ketones, UA: NEGATIVE
Nitrite, UA: NEGATIVE
Protein, UA: NEGATIVE
Spec Grav, UA: 1.025 (ref 1.010–1.025)
Urobilinogen, UA: 0.2 E.U./dL
pH, UA: 6 (ref 5.0–8.0)

## 2020-05-17 LAB — CBC
HCT: 44.3 % (ref 36.0–46.0)
Hemoglobin: 14.7 g/dL (ref 12.0–15.0)
MCHC: 33.2 g/dL (ref 30.0–36.0)
MCV: 87 fl (ref 78.0–100.0)
Platelets: 224 10*3/uL (ref 150.0–400.0)
RBC: 5.09 Mil/uL (ref 3.87–5.11)
RDW: 13.8 % (ref 11.5–15.5)
WBC: 8.5 10*3/uL (ref 4.0–10.5)

## 2020-05-17 MED ORDER — NITROFURANTOIN MONOHYD MACRO 100 MG PO CAPS: 100.0000 mg | ORAL_CAPSULE | Freq: Two times a day (BID) | ORAL | 0 refills | Status: DC

## 2020-05-17 NOTE — Progress Notes (Signed)
Subjective:    Patient ID: Tasha Miller, female    DOB: 1971-04-07, 50 y.o.   MRN: [id number]  HPI Here due to recurrence of urinary symptoms This visit occurred during the SARS-CoV-2 public health emergency.  Safety protocols were in place, including screening questions prior to the visit, additional usage of staff PPE, and extensive cleaning of exam room while observing appropriate contact time as indicated for disinfecting solutions.   Having similar spell to last month Has pain in both flanks---had related to work in garage but "won't ease up" This started 2 nights ago Then had urgency--then had "peed blood" yesterday morning (bloody urine) Not really having dysuria---just trouble getting the urine to come out (very little)  Past kidney stones--but had more of flank pain with past ones  Current Outpatient Medications on File Prior to Visit  Medication Sig Dispense Refill  . albuterol (VENTOLIN HFA) 108 (90 Base) MCG/ACT inhaler Inhale 2 puffs into the lungs as needed for wheezing or shortness of breath. 1 each 2  . cyanocobalamin (,VITAMIN B-12,) 1000 MCG/ML injection Inject 1,000 mcg into the muscle every 14 (fourteen) days.    Marland Kitchen estradiol (ESTRACE) 1 MG tablet     . fluticasone (FLOVENT HFA) 220 MCG/ACT inhaler TAKE 2 PUFFS BY MOUTH TWICE A DAY 36 each 1  . ipratropium-albuterol (DUONEB) 0.5-2.5 (3) MG/3ML SOLN Take 3 mLs by nebulization every 4 (four) hours as needed. 30 mL 0  . lamoTRIgine (LAMICTAL) 25 MG tablet Take 3 tablets (75 mg total) by mouth daily. 270 tablet 3  . ondansetron (ZOFRAN) 4 MG tablet Take 1 tablet (4 mg total) by mouth every 8 (eight) hours as needed. 20 tablet 0  . progesterone (PROMETRIUM) 100 MG capsule Take 100 mg by mouth at bedtime.    Marland Kitchen testosterone cypionate (DEPOTESTOTERONE CYPIONATE) 100 MG/ML injection Depo-Testosterone 100 mg/mL intramuscular oil  Inject 0.5 mL every 4 weeks by intramuscular route.    . traMADol (ULTRAM) 50 MG tablet TAKE 1  TABLET (50 MG TOTAL) BY MOUTH EVERY 8 (EIGHT) HOURS AS NEEDED. 30 tablet 0  . triamcinolone cream (KENALOG) 0.1 % APPLY TO AFFECTED AREA OF SKIN TWICE A DAY AS NEEDED TAPER USE AS ABLE    . Vitamin D, Ergocalciferol, (DRISDOL) 1.25 MG (50000 UT) CAPS capsule TAKE 1 CAPSULE BY MOUTH WEEKLY     No current facility-administered medications on file prior to visit.    Allergies  Allergen Reactions  . Latex Itching and Swelling  . Sulfa Antibiotics Itching and Nausea And Vomiting  . Amoxicillin-Pot Clavulanate   . Sulfonamide Derivatives Nausea And Vomiting    Past Medical History:  Diagnosis Date  . Asthma   . PONV (postoperative nausea and vomiting)   . Rubella   . SVT (supraventricular tachycardia) (HCC)     Past Surgical History:  Procedure Laterality Date  . DIAGNOSTIC LAPAROSCOPY    . left wrist surgery    . lymph node removal on right side of neck    . right elbow surgery    . rotator cuff surgery    . TONSILLECTOMY    . VAGINAL HYSTERECTOMY    . 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 WISDOM TOOTH EXTRACTION      Family History  Problem Relation Age of Onset  . Cancer Maternal Aunt  thyroid cancer  . Cancer Maternal Uncle   . Diabetes Paternal Aunt   . Heart disease Neg Hx   . Stroke Neg Hx   . Breast cancer Neg Hx     Social History   Socioeconomic History  . Marital status: Media planner    Spouse name: Not on file  . Number of children: Not on file  . Years of education: Not on file  . Highest education level: Not on file  Occupational History  . Not on file  Tobacco Use  . Smoking status: Never Smoker  . Smokeless tobacco: Never Used  Vaping Use  . Vaping Use: Never used  Substance and Sexual Activity  . Alcohol use: No  . Drug use: No  . Sexual activity: Yes  Other Topics  Concern  . Not on file  Social History Narrative  . Not on file   Social Determinants of Health   Financial Resource Strain: Not on file  Food Insecurity: Not on file  Transportation Needs: Not on file  Physical Activity: Not on file  Stress: Not on file  Social Connections: Not on file  Intimate Partner Violence: Not on file   Review of Systems No fever No N/V Eating and drinking okay Bowels okay No new sexual relationship No vaginal products    Objective:   Physical Exam Constitutional:      Appearance: Normal appearance.  Abdominal:     Palpations: Abdomen is soft. There is no mass.     Tenderness: There is right CVA tenderness. There is no left CVA tenderness.     Hernia: No hernia is present.     Comments: Mild tenderness in mid left inguinal area  Neurological:     Mental Status: She is alert.            Assessment & Plan:

## 2020-05-17 NOTE — Assessment & Plan Note (Addendum)
Had spell a month ago---culture negative but did get better with the antibiotics Now recurred---with blood Trouble with decreased urine Past stones though these symptoms are different  Will start macrobid just in case Send culture again Labs and CT scan to check for stone--stat in case there could be renal obstruction

## 2020-05-17 NOTE — Addendum Note (Signed)
Addended by: Eual Fines on: 05/17/2020 11:05 AM   Modules accepted: Orders

## 2020-05-18 LAB — URINE CULTURE
MICRO NUMBER:: 11459852
SPECIMEN QUALITY:: ADEQUATE

## 2020-05-19 ENCOUNTER — Telehealth: Payer: Self-pay | Admitting: Internal Medicine

## 2020-05-19 NOTE — Telephone Encounter (Signed)
Patient called back regarding STAT CT Renal Stone Study and states that she cannot afford to pay for this scan at this time  Pt feels "better", not urinating visible blood anymore, pain in back has improved.   Pt states that she cannot afford to have a CT scan just to show if she has a stone - I explained to her the benefits of having the scan, not only to visibly see if she has a stone but it will tell us how large it is and also where its is located - I explained that that the scan shows the size so that we know whether she can pass th stone on her own.   Pt states that she is going to monitor her symptoms and will contact our office if symptoms become severe. Pt aware that I will send this message to her PCP and ordering Provider so that they can give further instructions.   Order cancelled for now.  Please advise, thanks.

## 2020-05-19 NOTE — Telephone Encounter (Signed)
Noted. Nothing further needed at this time. Pt will call back if symptoms persist or worsen.

## 2020-05-24 ENCOUNTER — Ambulatory Visit: Payer: Managed Care, Other (non HMO) | Admitting: Podiatry

## 2020-05-26 ENCOUNTER — Encounter: Payer: Self-pay | Admitting: Podiatry

## 2020-05-26 ENCOUNTER — Other Ambulatory Visit: Payer: Self-pay

## 2020-05-26 ENCOUNTER — Ambulatory Visit (INDEPENDENT_AMBULATORY_CARE_PROVIDER_SITE_OTHER): Payer: Managed Care, Other (non HMO)

## 2020-05-26 ENCOUNTER — Ambulatory Visit (INDEPENDENT_AMBULATORY_CARE_PROVIDER_SITE_OTHER): Payer: Managed Care, Other (non HMO) | Admitting: Podiatry

## 2020-05-26 ENCOUNTER — Encounter: Payer: Self-pay | Admitting: *Deleted

## 2020-05-26 DIAGNOSIS — Z9889 Other specified postprocedural states: Secondary | ICD-10-CM

## 2020-05-26 DIAGNOSIS — M2011 Hallux valgus (acquired), right foot: Secondary | ICD-10-CM

## 2020-05-26 DIAGNOSIS — M2012 Hallux valgus (acquired), left foot: Secondary | ICD-10-CM

## 2020-05-26 NOTE — Progress Notes (Signed)
   Subjective:  Patient presents today status post bilateral Austin bunionectomy. DOS: 03/24/2020.  Patient states that she is doing well and she is ambulating in tennis shoes.  She continues to have some stiffness and swelling especially to the right surgical site.  Otherwise no new complaints at this time  Past Medical History:  Diagnosis Date  . Asthma   . PONV (postoperative nausea and vomiting)   . Rubella   . SVT (supraventricular tachycardia) (HCC)       Objective/Physical Exam Neurovascular status intact.  Skin incisions appear to be well coapted and healed. No sign of infectious process noted. No dehiscence. No active bleeding noted.  There is some minimal edema localized to the first MTPJ of the right foot  Radiographic Exam:  Orthopedic hardware and osteotomies sites appear to be stable with routine healing.  Assessment: 1. s/p Austin bunionectomy bilateral. DOS: 03/24/2020   Plan of Care:  1. Patient was evaluated. X-rays reviewed 2.  Continue wearing good supportive shoes 3.  Patient may resume full activity no restrictions 4.  Return to work scheduled for 06/05/2020 5.  Return to clinic as needed  *Paramedic   Felecia Shelling, DPM [facility]  Dr. Felecia Shelling, DPM    [address] [place], [place] [address]                Office [phone]  Fax [phone]

## 2020-06-12 ENCOUNTER — Encounter: Payer: Self-pay | Admitting: Internal Medicine

## 2020-06-16 NOTE — Telephone Encounter (Signed)
Wanted to be setup as a new pt.

## 2020-08-09 ENCOUNTER — Ambulatory Visit (INDEPENDENT_AMBULATORY_CARE_PROVIDER_SITE_OTHER): Payer: Managed Care, Other (non HMO)

## 2020-08-09 ENCOUNTER — Encounter: Payer: Self-pay | Admitting: Podiatry

## 2020-08-09 ENCOUNTER — Ambulatory Visit: Payer: Managed Care, Other (non HMO) | Admitting: Podiatry

## 2020-08-09 ENCOUNTER — Other Ambulatory Visit: Payer: Self-pay | Admitting: Podiatry

## 2020-08-09 ENCOUNTER — Other Ambulatory Visit: Payer: Self-pay

## 2020-08-09 DIAGNOSIS — S92902A Unspecified fracture of left foot, initial encounter for closed fracture: Secondary | ICD-10-CM

## 2020-08-09 DIAGNOSIS — M778 Other enthesopathies, not elsewhere classified: Secondary | ICD-10-CM

## 2020-08-09 DIAGNOSIS — M2011 Hallux valgus (acquired), right foot: Secondary | ICD-10-CM | POA: Diagnosis not present

## 2020-08-09 DIAGNOSIS — M2012 Hallux valgus (acquired), left foot: Secondary | ICD-10-CM | POA: Diagnosis not present

## 2020-08-09 DIAGNOSIS — Z9889 Other specified postprocedural states: Secondary | ICD-10-CM

## 2020-08-09 DIAGNOSIS — M2010 Hallux valgus (acquired), unspecified foot: Secondary | ICD-10-CM

## 2020-08-09 NOTE — Progress Notes (Signed)
She presents today date of surgery March 24, 2020 [facility] bunionectomy bilaterally states the right 1 is doing pretty good just sore occasionally but the left one is still hurts to walk on it is very tender right and here she points to the sesamoid.  Objective: Vital signs are stable alert and oriented x3.  Left foot still demonstrates some edema and has a good full range of motion of the first metatarsophalangeal joint.  The majority of her tenderness is associated with the fibular sesamoid.  Radiographs taken today do demonstrate what appears to be a cystic lesion in the sesamoid itself.  This very well could be osteoarthritic changes avascular necrosis or fracture.  Assessment well-healing surgical foot right chronic pain status post bunionectomy left fibular sesamoid.  Cannot rule out fracture to the fibular sesamoid osteoarthritis to the fibular sesamoid complex or avascular necrosis.  Plan: At this point we are requesting a CT scan of the first metatarsophalangeal joint.  I will follow-up with her once this comes in.  I did inject her today 10 mg Kenalog 5 mg Marcaine point of maximal tenderness.

## 2020-08-17 ENCOUNTER — Other Ambulatory Visit: Payer: Managed Care, Other (non HMO)

## 2020-08-23 ENCOUNTER — Encounter: Payer: Self-pay | Admitting: Podiatry

## 2020-08-25 ENCOUNTER — Other Ambulatory Visit: Payer: Managed Care, Other (non HMO)

## 2020-08-28 ENCOUNTER — Encounter: Payer: Self-pay | Admitting: Podiatry

## 2020-09-06 ENCOUNTER — Other Ambulatory Visit: Payer: Self-pay

## 2020-09-06 ENCOUNTER — Telehealth (INDEPENDENT_AMBULATORY_CARE_PROVIDER_SITE_OTHER): Payer: Managed Care, Other (non HMO) | Admitting: Internal Medicine

## 2020-09-06 ENCOUNTER — Encounter: Payer: Self-pay | Admitting: Internal Medicine

## 2020-09-06 DIAGNOSIS — J019 Acute sinusitis, unspecified: Secondary | ICD-10-CM | POA: Diagnosis not present

## 2020-09-06 MED ORDER — AZITHROMYCIN 250 MG PO TABS
ORAL_TABLET | ORAL | 0 refills | Status: DC
Start: 2020-09-06 — End: 2021-03-08

## 2020-09-06 MED ORDER — PREDNISONE 10 MG PO TABS: ORAL_TABLET | ORAL | 0 refills | Status: DC

## 2020-09-06 NOTE — Progress Notes (Signed)
Virtual Visit via Video Note  I connected with Tasha Miller on 09/06/20 at 11:20 AM EDT by a video enabled telemedicine application and verified that I am speaking with the correct person using two identifiers.  Location: Patient: Home Provider: Office   Persons participating in this video call: Rollen Sox, NP and Rexene Agent.   I discussed the limitations of evaluation and management by telemedicine and the availability of in person appointments. The patient expressed understanding and agreed to proceed.  History of Present Illness:  Pt reports facial pain and pressure, nasal congestion and postnasal drip.  This started 3 days ago.  She is able to blow some clear/bloody mucus out of her nose.  She denies headache, ear pain, sore throat, cough, shortness of breath, nausea, vomiting or diarrhea.  She denies fever, chills or body aches but overall just does not feel well.  She has taken Sudafed OTC with minimal relief of symptoms.  She has had a negative COVID test.  She denies sick contacts but does work for [employer].   Past Medical History:  Diagnosis Date  . Asthma   . PONV (postoperative nausea and vomiting)   . Rubella   . SVT (supraventricular tachycardia) (HCC)     Current Outpatient Medications  Medication Sig Dispense Refill  . albuterol (VENTOLIN HFA) 108 (90 Base) MCG/ACT inhaler Inhale 2 puffs into the lungs as needed for wheezing or shortness of breath. 1 each 2  . cyanocobalamin (,VITAMIN B-12,) 1000 MCG/ML injection Inject 1,000 mcg into the muscle every 14 (fourteen) days.    Marland Kitchen estradiol (ESTRACE) 1 MG tablet     . fluticasone (FLOVENT HFA) 220 MCG/ACT inhaler TAKE 2 PUFFS BY MOUTH TWICE A DAY 36 each 1  . ipratropium-albuterol (DUONEB) 0.5-2.5 (3) MG/3ML SOLN Take 3 mLs by nebulization every 4 (four) hours as needed. 30 mL 0  . lamoTRIgine (LAMICTAL) 25 MG tablet Take 3 tablets (75 mg total) by mouth daily. 270 tablet 3  . nitrofurantoin,  macrocrystal-monohydrate, (MACROBID) 100 MG capsule Take 1 capsule (100 mg total) by mouth 2 (two) times daily. 14 capsule 0  . ondansetron (ZOFRAN) 4 MG tablet Take 1 tablet (4 mg total) by mouth every 8 (eight) hours as needed. 20 tablet 0  . progesterone (PROMETRIUM) 100 MG capsule Take 100 mg by mouth at bedtime.    Marland Kitchen testosterone cypionate (DEPOTESTOTERONE CYPIONATE) 100 MG/ML injection Depo-Testosterone 100 mg/mL intramuscular oil  Inject 0.5 mL every 4 weeks by intramuscular route.    . traMADol (ULTRAM) 50 MG tablet TAKE 1 TABLET (50 MG TOTAL) BY MOUTH EVERY 8 (EIGHT) HOURS AS NEEDED. 30 tablet 0  . triamcinolone cream (KENALOG) 0.1 % APPLY TO AFFECTED AREA OF SKIN TWICE A DAY AS NEEDED TAPER USE AS ABLE    . Vitamin D, Ergocalciferol, (DRISDOL) 1.25 MG (50000 UT) CAPS capsule TAKE 1 CAPSULE BY MOUTH WEEKLY     No current facility-administered medications for this visit.    Allergies  Allergen Reactions  . Latex Itching and Swelling  . Sulfa Antibiotics Itching and Nausea And Vomiting  . Amoxicillin-Pot Clavulanate   . Sulfonamide Derivatives Nausea And Vomiting    Family History  Problem Relation Age of Onset  . Cancer Maternal Aunt        thyroid cancer  . Cancer Maternal Uncle   . Diabetes Paternal Aunt   . Heart disease Neg Hx   . Stroke Neg Hx   . Breast cancer Neg Hx  Social History   Socioeconomic History  . Marital status: Media planner    Spouse name: Not on file  . Number of children: Not on file  . Years of education: Not on file  . Highest education level: Not on file  Occupational History  . Not on file  Tobacco Use  . Smoking status: Never Smoker  . Smokeless tobacco: Never Used  Vaping Use  . Vaping Use: Never used  Substance and Sexual Activity  . Alcohol use: No  . Drug use: No  . Sexual activity: Yes  Other Topics Concern  . Not on file  Social History Narrative  . Not on file   Social Determinants of Health   Financial Resource  Strain: Not on file  Food Insecurity: Not on file  Transportation Needs: Not on file  Physical Activity: Not on file  Stress: Not on file  Social Connections: Not on file  Intimate Partner Violence: Not on file     Constitutional: Denies fever, malaise, fatigue, headache or abrupt weight changes.  HEENT: Patient reports facial pain and pressure, nasal congestion and postnasal drip.  Denies eye pain, eye redness, ear pain, ringing in the ears, wax buildup, runny nose, bloody nose, or sore throat. Respiratory: Denies difficulty breathing, shortness of breath, cough or sputum production.   Cardiovascular: Denies chest pain, chest tightness, palpitations or swelling in the hands or feet.  Gastrointestinal: Denies abdominal pain, bloating, constipation, diarrhea or blood in the stool.   No other specific complaints in a complete review of systems (except as listed in HPI above).    Observations/Objective: Wt Readings from Last 3 Encounters:  05/17/20 147 lb (66.7 kg)  01/18/20 147 lb (66.7 kg)  12/01/18 138 lb (62.6 kg)    General: Appears her stated age, ill-appearing but in NAD. Skin: No rash noted. HEENT: Head: normal shape and size; Nose: Congestion noted; Throat/Mouth: Hoarseness noted Pulmonary/Chest: Normal effort. No respiratory distress. Neurological: Alert and oriented.   BMET    Component Value Date/Time   NA 141 05/17/2020 1059   NA 140 02/28/2016 0900   NA 144 07/10/2011 1025   K 4.1 05/17/2020 1059   K 3.6 07/10/2011 1025   CL 106 05/17/2020 1059   CL 108 (H) 07/10/2011 1025   CO2 30 05/17/2020 1059   CO2 24 07/10/2011 1025   GLUCOSE 96 05/17/2020 1059   GLUCOSE 142 (H) 07/10/2011 1025   BUN 8 05/17/2020 1059   BUN 10 02/28/2016 0900   BUN 10 07/10/2011 1025   CREATININE 0.87 05/17/2020 1059   CREATININE 1.00 06/26/2018 1521   CALCIUM 9.3 05/17/2020 1059   CALCIUM 8.7 07/10/2011 1025   GFRNONAA >60 12/01/2018 2325   GFRNONAA >60 07/10/2011 1025   GFRAA  >60 12/01/2018 2325   GFRAA >60 07/10/2011 1025    Lipid Panel     Component Value Date/Time   CHOL 217 (H) 01/18/2020 1542   CHOL 209 (H) 10/16/2017 1438   TRIG 227.0 (H) 01/18/2020 1542   HDL 43.50 01/18/2020 1542   HDL 42 10/16/2017 1438   CHOLHDL 5 01/18/2020 1542   VLDL 45.4 (H) 01/18/2020 1542   LDLCALC  06/26/2018 1521     Comment:     . LDL cholesterol not calculated. Triglyceride levels greater than 400 mg/dL invalidate calculated LDL results. . Reference range: <100 . Desirable range <100 mg/dL for primary prevention;   <70 mg/dL for patients with CHD or diabetic patients  with > or = 2 CHD risk  factors. Marland Kitchen LDL-C is now calculated using the Martin-Hopkins  calculation, which is a validated novel method providing  better accuracy than the Friedewald equation in the  estimation of LDL-C.  Horald Pollen et al. Lenox Ahr. 6314;970(26): 2061-2068  (http://education.[facility].com/faq/FAQ164)     CBC    Component Value Date/Time   WBC 8.5 05/17/2020 1059   RBC 5.09 05/17/2020 1059   HGB 14.7 05/17/2020 1059   HGB 14.4 02/28/2016 0900   HCT 44.3 05/17/2020 1059   HCT 42.2 02/28/2016 0900   PLT 224.0 05/17/2020 1059   PLT 252 02/28/2016 0900   MCV 87.0 05/17/2020 1059   MCV 85 02/28/2016 0900   MCV 88 07/10/2011 1025   MCH 29.0 12/01/2018 2325   MCHC 33.2 05/17/2020 1059   RDW 13.8 05/17/2020 1059   RDW 13.2 02/28/2016 0900   RDW 12.6 07/10/2011 1025   LYMPHSABS 5.4 (H) 12/01/2018 2325   LYMPHSABS 3.6 (H) 02/28/2016 0900   MONOABS 0.8 12/01/2018 2325   EOSABS 0.2 12/01/2018 2325   EOSABS 0.3 02/28/2016 0900   BASOSABS 0.1 12/01/2018 2325   BASOSABS 0.1 02/28/2016 0900    Hgb A1C Lab Results  Component Value Date   HGBA1C 5.9 01/18/2020      Assessment and Plan:  Viral Sinusitis:  RX for Pred taper x 6 days RX for Azithromycin 250 mg x 5 days as she reports she can taste infection Stop Sudafed  Update me if symptoms persist or  worsen  Follow Up Instructions:    I discussed the assessment and treatment plan with the patient. The patient was provided an opportunity to ask questions and all were answered. The patient agreed with the plan and demonstrated an understanding of the instructions.   The patient was advised to call back or seek an in-person evaluation if the symptoms worsen or if the condition fails to improve as anticipated.    Nicki Reaper, NP

## 2020-09-06 NOTE — Patient Instructions (Signed)

## 2020-09-07 ENCOUNTER — Encounter: Payer: Self-pay | Admitting: Internal Medicine

## 2020-09-07 ENCOUNTER — Other Ambulatory Visit: Payer: Managed Care, Other (non HMO)

## 2020-09-07 ENCOUNTER — Other Ambulatory Visit: Payer: Self-pay | Admitting: Internal Medicine

## 2020-09-07 MED ORDER — NIRMATRELVIR/RITONAVIR (PAXLOVID)TABLET: ORAL_TABLET | ORAL | 0 refills | Status: DC

## 2020-09-07 NOTE — Progress Notes (Signed)
pa

## 2020-09-20 ENCOUNTER — Ambulatory Visit
Admission: RE | Admit: 2020-09-20 | Discharge: 2020-09-20 | Disposition: A | Discharge status: Home / Self Care | Payer: Managed Care, Other (non HMO) | Source: Ambulatory Visit | Attending: Podiatry | Admitting: Podiatry

## 2020-09-20 DIAGNOSIS — S92902A Unspecified fracture of left foot, initial encounter for closed fracture: Secondary | ICD-10-CM

## 2020-09-20 MED ORDER — PREDNISONE 10 MG PO TABS: ORAL_TABLET | ORAL | 0 refills | Status: DC

## 2020-10-04 ENCOUNTER — Other Ambulatory Visit: Payer: Self-pay

## 2020-10-04 ENCOUNTER — Encounter: Payer: Self-pay | Admitting: Podiatry

## 2020-10-04 ENCOUNTER — Ambulatory Visit (INDEPENDENT_AMBULATORY_CARE_PROVIDER_SITE_OTHER): Payer: Managed Care, Other (non HMO) | Admitting: Podiatry

## 2020-10-04 DIAGNOSIS — M2580 Other specified joint disorders, unspecified joint: Secondary | ICD-10-CM | POA: Diagnosis not present

## 2020-10-04 DIAGNOSIS — T847XXA Infection and inflammatory reaction due to other internal orthopedic prosthetic devices, implants and grafts, initial encounter: Secondary | ICD-10-CM

## 2020-10-04 NOTE — Progress Notes (Signed)
She presents today for her CT results stating that she is still having pain to the fibular sesamoid left hallux plantarly.  She is also complaining of some pain in the first intermetatarsal space with some thickness and swelling that is still present.  Would like to consider surgical intervention in the near future.  ROS: Denies fever chills nausea vomiting muscle aches pains calf pain back pain chest pain shortness of breath.  Objective: Vital signs are stable she alert and oriented x3.  Pulses are palpable.  Neurologic sensorium is intact Deetjen reflexes are intact muscle length is normal symmetrical.  Reviewed CT scan with her which does demonstrate a fracture of the fibular sesamoid with complete fusion of the capital fragment and screws intact first metatarsal.  Pain on palpation to the first metatarsal bones bilateral.  She also has some tenderness on palpation of the first intermetatarsal space of the right foot.  No signs of infection.  Assessment: Painful fracture fibular sesamoid left foot.  Painful internal fixation first metatarsal bilateral.  Plan: Discussed etiology pathology conservative surgical therapies at this point we consented her for complete excision of the fibular sesamoid.  Plantar incision which will require 3 weeks nonweightbearing status she understands this is amenable to it.  We will also go ahead and remove all internal fixation first metatarsals bilaterally.  She understands this is amendable to it.  Hopefully this will help alleviate some of the chronic soreness in those joints and in that metatarsal.  I will follow-up with her in the near future for surgical intervention.

## 2020-10-09 ENCOUNTER — Encounter: Payer: Self-pay | Admitting: Podiatry

## 2020-10-21 ENCOUNTER — Other Ambulatory Visit: Payer: Self-pay | Admitting: Podiatry

## 2020-10-24 MED ORDER — TRAMADOL HCL 50 MG PO TABS: 50.0000 mg | ORAL_TABLET | Freq: Three times a day (TID) | ORAL | 0 refills | Status: DC | PRN

## 2020-12-20 ENCOUNTER — Other Ambulatory Visit: Payer: Self-pay

## 2020-12-20 ENCOUNTER — Ambulatory Visit: Payer: Managed Care, Other (non HMO) | Admitting: Podiatry

## 2020-12-20 ENCOUNTER — Encounter: Payer: Self-pay | Admitting: Podiatry

## 2020-12-20 ENCOUNTER — Ambulatory Visit (INDEPENDENT_AMBULATORY_CARE_PROVIDER_SITE_OTHER): Payer: Managed Care, Other (non HMO)

## 2020-12-20 DIAGNOSIS — M778 Other enthesopathies, not elsewhere classified: Secondary | ICD-10-CM | POA: Diagnosis not present

## 2020-12-20 MED ORDER — TRIAMCINOLONE ACETONIDE 40 MG/ML IJ SUSP
40.0000 mg | Freq: Once | INTRAMUSCULAR | Status: AC
  Administered 2020-12-20: 40 mg

## 2020-12-20 NOTE — Progress Notes (Signed)
She presents today for follow-up of her painful forefoot bilaterally states that now the right is starting to bother me as she refers to the second metatarsophalangeal joint area.  States that the left one still bothers me a bit but not as bad as it was.  She states that she has other joints that are bothering her particular her elbows and her fingers.  Objective: Vital signs are stable she is alert and oriented x3.  Pulses are palpable.  She has edema overlying the second and third metatarsal phalangeal joint with pain on end range of motion.  Radiographs taken today of this area do not demonstrate any type of fracture.  No periostitis no displacement no cortical interruptions.  She also has tenderness on palpation of the sesamoids left foot.  Assessment: Sesamoiditis left capsulitis right  Plan: Discussed etiology pathology and surgical therapies periarticular injection around second metatarsal phalangeal joint 10 mg to utilize right foot.  Left foot same milligram of injections to the sesamoid apparatus.  Plan: Follow-up with me in a few weeks for reevaluation.

## 2020-12-21 LAB — CBC WITH DIFFERENTIAL/PLATELET
Basophils Absolute: 0.1 10*3/uL (ref 0.0–0.2)
Basos: 1 %
EOS (ABSOLUTE): 0.3 10*3/uL (ref 0.0–0.4)
Eos: 3 %
Hematocrit: 47.4 % — ABNORMAL HIGH (ref 34.0–46.6)
Hemoglobin: 16.2 g/dL — ABNORMAL HIGH (ref 11.1–15.9)
Immature Grans (Abs): 0 10*3/uL (ref 0.0–0.1)
Immature Granulocytes: 1 %
Lymphocytes Absolute: 2.5 10*3/uL (ref 0.7–3.1)
Lymphs: 34 %
MCH: 29.2 pg (ref 26.6–33.0)
MCHC: 34.2 g/dL (ref 31.5–35.7)
MCV: 85 fL (ref 79–97)
Monocytes Absolute: 0.4 10*3/uL (ref 0.1–0.9)
Monocytes: 6 %
Neutrophils Absolute: 4.1 10*3/uL (ref 1.4–7.0)
Neutrophils: 55 %
Platelets: 209 10*3/uL (ref 150–450)
RBC: 5.55 x10E6/uL — ABNORMAL HIGH (ref 3.77–5.28)
RDW: 12.3 % (ref 11.7–15.4)
WBC: 7.4 10*3/uL (ref 3.4–10.8)

## 2020-12-21 LAB — ANA: Anti Nuclear Antibody (ANA): NEGATIVE

## 2020-12-21 LAB — SEDIMENTATION RATE: Sed Rate: 2 mm/hr (ref 0–40)

## 2020-12-21 LAB — C-REACTIVE PROTEIN: CRP: 1 mg/L (ref 0–10)

## 2020-12-21 LAB — RHEUMATOID FACTOR: Rheumatoid fact SerPl-aCnc: <10 IU/mL (ref <14.0)

## 2020-12-21 LAB — URIC ACID: Uric Acid: 7.2 mg/dL (ref 3.0–7.2)

## 2021-01-18 ENCOUNTER — Encounter: Payer: Managed Care, Other (non HMO) | Admitting: Internal Medicine

## 2021-01-19 ENCOUNTER — Telehealth: Payer: Self-pay

## 2021-01-19 NOTE — Telephone Encounter (Signed)
-----   Message from Elinor Parkinson, [place] sent at 12/23/2020  2:46 PM EDT ----- Negative arthritis panel

## 2021-01-19 NOTE — Telephone Encounter (Signed)
Patient has been notified of results.  

## 2021-01-24 ENCOUNTER — Ambulatory Visit: Payer: Managed Care, Other (non HMO) | Admitting: Podiatry

## 2021-01-31 ENCOUNTER — Encounter: Payer: Managed Care, Other (non HMO) | Admitting: Podiatry

## 2021-02-01 ENCOUNTER — Other Ambulatory Visit: Payer: Self-pay

## 2021-02-01 ENCOUNTER — Emergency Department
Admission: EM | Admit: 2021-02-01 | Discharge: 2021-02-02 | Disposition: A | Discharge status: Home / Self Care | Payer: Managed Care, Other (non HMO) | Attending: Orthopedic Surgery | Admitting: Orthopedic Surgery

## 2021-02-01 DIAGNOSIS — Z91040 Latex allergy status: Secondary | ICD-10-CM | POA: Insufficient documentation

## 2021-02-01 DIAGNOSIS — J45909 Unspecified asthma, uncomplicated: Secondary | ICD-10-CM | POA: Diagnosis not present

## 2021-02-01 DIAGNOSIS — S76311A Strain of muscle, fascia and tendon of the posterior muscle group at thigh level, right thigh, initial encounter: Secondary | ICD-10-CM | POA: Diagnosis not present

## 2021-02-01 DIAGNOSIS — Y9302 Activity, running: Secondary | ICD-10-CM | POA: Diagnosis not present

## 2021-02-01 DIAGNOSIS — S79921A Unspecified injury of right thigh, initial encounter: Secondary | ICD-10-CM | POA: Diagnosis present

## 2021-02-01 DIAGNOSIS — X58XXXA Exposure to other specified factors, initial encounter: Secondary | ICD-10-CM | POA: Insufficient documentation

## 2021-02-01 MED ORDER — ONDANSETRON 4 MG PO TBDP
ORAL_TABLET | ORAL | Status: AC
  Filled 2021-02-01: qty 1

## 2021-02-01 MED ORDER — HYDROMORPHONE HCL 1 MG/ML IJ SOLN
1.0000 mg | Freq: Once | INTRAMUSCULAR | Status: AC
  Administered 2021-02-01: 1 mg via INTRAMUSCULAR
  Filled 2021-02-01: qty 1

## 2021-02-01 MED ORDER — DIPHENHYDRAMINE HCL 25 MG PO CAPS
25.0000 mg | ORAL_CAPSULE | Freq: Once | ORAL | Status: AC
  Administered 2021-02-01: 25 mg via ORAL
  Filled 2021-02-01: qty 1

## 2021-02-01 MED ORDER — IBUPROFEN 600 MG PO TABS: 600.0000 mg | ORAL_TABLET | Freq: Four times a day (QID) | ORAL | 0 refills | Status: DC | PRN

## 2021-02-01 MED ORDER — OXYCODONE HCL 5 MG PO TABS: 5.0000 mg | ORAL_TABLET | Freq: Four times a day (QID) | ORAL | 0 refills | Status: DC | PRN

## 2021-02-01 MED ORDER — ORPHENADRINE CITRATE 30 MG/ML IJ SOLN
60.0000 mg | Freq: Once | INTRAMUSCULAR | Status: AC
  Administered 2021-02-01: 60 mg via INTRAMUSCULAR
  Filled 2021-02-01: qty 2

## 2021-02-01 MED ORDER — ONDANSETRON 4 MG PO TBDP: 4.0000 mg | ORAL_TABLET | Freq: Three times a day (TID) | ORAL | 0 refills | Status: DC | PRN

## 2021-02-01 MED ORDER — METHOCARBAMOL 500 MG PO TABS: 500.0000 mg | ORAL_TABLET | Freq: Four times a day (QID) | ORAL | 0 refills | Status: DC | PRN

## 2021-02-01 MED ORDER — ONDANSETRON HCL 4 MG PO TABS
4.0000 mg | ORAL_TABLET | Freq: Once | ORAL | Status: AC
  Administered 2021-02-01: 4 mg via ORAL

## 2021-02-01 NOTE — ED Notes (Signed)
Pt to room via wheelchair; grimacing and groaning while holding injured leg.

## 2021-02-01 NOTE — ED Notes (Signed)
Provider JM at bedside.  

## 2021-02-01 NOTE — ED Notes (Signed)
Provider TG to bedside.

## 2021-02-01 NOTE — Discharge Instructions (Signed)
Please continue with Ace wrap and ice to the right hamstring.  Use crutches as needed for ambulation.  Take Tylenol and ibuprofen for mild to moderate pain and oxycodone as needed severe pain.  Use methocarbamol for muscle relaxation every 6 hours as needed.  Call orthopedic office tomorrow to schedule follow-up appointment.  If any severe swelling, increasing pain, numbness tingling return to the ER for further evaluation.

## 2021-02-01 NOTE — ED Provider Notes (Signed)
[facility] EMERGENCY DEPARTMENT Provider Note   CSN: [id number] Arrival date & time: 02/01/21  2234     History Chief Complaint  Patient presents with   Leg Pain    Tasha Miller is a 50 y.o. female presents to the emergency department for evaluation of right posterior thigh pain.  Patient states she was running playing softball, made a extra long stride and felt a pop along her right mid thigh in the middle of the hamstring.  She developed instant muscle pain, tightness and spasm.  She has been unable to put weight on the right leg.  She denies any knee pain, groin or thigh pain.  Patient points to location of pain along the mid hamstring.  She denies any bruising, swelling.  She does have a little bit of tingling going down into the right foot and ankle.  She has not had a medications for pain.  Pain is severe.  She denies any other injury to her body.  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     Family History  Problem Relation Age of Onset   Cancer Maternal Aunt        thyroid cancer   Cancer Maternal Uncle    Diabetes Paternal Aunt    Heart  disease Neg Hx    Stroke Neg Hx    Breast cancer Neg Hx     Social History   Tobacco Use   Smoking status: Never   Smokeless tobacco: Never  Vaping Use   Vaping Use: Never used  Substance Use Topics   Alcohol use: No   Drug use: No    Home Medications Prior to Admission medications   Medication Sig Start Date End Date Taking? Authorizing Provider  ibuprofen (ADVIL) 600 MG tablet Take 1 tablet (600 mg total) by mouth every 6 (six) hours as needed for moderate pain. 02/01/21  Yes Evon Slack, PA-C  methocarbamol (ROBAXIN) 500 MG tablet Take 1 tablet (500 mg total) by mouth every 6 (six) hours as needed for muscle spasms. 02/01/21  Yes Amador Cunas C, PA-C  ondansetron (ZOFRAN ODT) 4 MG disintegrating tablet Take 1 tablet (4 mg total) by mouth every 8 (eight) hours as needed for nausea or vomiting. 02/01/21  Yes Evon Slack, PA-C  oxyCODONE (ROXICODONE) 5 MG immediate release tablet Take 1-2 tablets (5-10 mg total) by mouth every 6 (six) hours as needed. 02/01/21 02/01/22 Yes Evon Slack, PA-C  albuterol (VENTOLIN HFA) 108 (90 Base) MCG/ACT inhaler  Inhale 2 puffs into the lungs as needed for wheezing or shortness of breath. 04/24/20   Lorre Munroe, NP  azithromycin (ZITHROMAX) 250 MG tablet Take 2 tabs today, then 1 tab daily x 4 days 09/06/20   Lorre Munroe, NP  cyanocobalamin (,VITAMIN B-12,) 1000 MCG/ML injection Inject 1,000 mcg into the muscle every 14 (fourteen) days.    [provider]  estradiol (ESTRACE) 1 MG tablet  09/14/18   [provider]  lamoTRIgine (LAMICTAL) 25 MG tablet Take 3 tablets (75 mg total) by mouth daily. Patient not taking: Reported on 09/06/2020 01/18/20   Lorre Munroe, NP  nirmatrelvir/ritonavir EUA (PAXLOVID) TABS GFR is > 60  nirmatrelvir (150 mg) 2 tabs PO BID for 5 days and ritonavir (100 mg) 1 tab PO BID for 5 days. 09/07/20   Lorre Munroe, NP  predniSONE (DELTASONE) 10 MG tablet Take 6 tabs on day 1, 5 tabs on  day 2, 4 tabs on day 3, 3 tabs on day 4, 2 tabs on day 5, 1 tab on day 6 09/20/20   Lorre Munroe, NP  progesterone (PROMETRIUM) 100 MG capsule Take 100 mg by mouth at bedtime. 08/27/18   [provider]  testosterone cypionate (DEPOTESTOTERONE CYPIONATE) 100 MG/ML injection Depo-Testosterone 100 mg/mL intramuscular oil  Inject 0.5 mL every 4 weeks by intramuscular route.    [provider]  traMADol (ULTRAM) 50 MG tablet Take 1 tablet (50 mg total) by mouth every 8 (eight) hours as needed. 10/24/20   Hyatt, Max T, DPM  triamcinolone cream (KENALOG) 0.1 % APPLY TO AFFECTED AREA OF SKIN TWICE A DAY AS NEEDED TAPER USE AS ABLE Patient not taking: Reported on 09/06/2020 12/22/18   [provider]  Vitamin D, Ergocalciferol, (DRISDOL) 1.25 MG (50000 UT) CAPS capsule TAKE 1 CAPSULE BY MOUTH WEEKLY 08/13/18   [provider]    Allergies    Latex, Sulfa antibiotics, Amoxicillin-pot clavulanate, and Sulfonamide derivatives  Review of Systems   Review of Systems  Constitutional:  Negative for chills and fever.  Respiratory:  Negative for shortness of breath.   Cardiovascular:  Negative for chest pain.  Musculoskeletal:  Positive for gait problem and myalgias. Negative for arthralgias, back pain and joint swelling.  Skin:  Negative for color change, rash and wound.  Neurological:  Negative for numbness.   Physical Exam Updated Vital Signs BP (!) 140/97   Pulse (!) 116   Temp 98.3 F (36.8 C)   Resp 18   Ht 5\' 4"  (1.626 m)   Wt 64.4 kg   SpO2 100%   BMI 24.37 kg/m   Physical Exam Constitutional:      Appearance: Normal appearance. She is well-developed.  HENT:     Head: Normocephalic and atraumatic.  Eyes:     Conjunctiva/sclera: Conjunctivae normal.  Cardiovascular:     Rate and Rhythm: Normal rate.  Pulmonary:     Effort: Pulmonary effort is normal. No respiratory distress.  Musculoskeletal:        General: Normal range of motion.     Cervical back:  Normal range of motion.     Comments: Examination of the right lower extremity shows patient has good hip internal X rotation with no discomfort in the groin thigh or buttocks.  She is nontender to palpation throughout the groin or lateral hip.  She has no tenderness to palpation throughout the knee.  Examination of the soft tissues of the posterior thigh show no ecchymosis or  swelling.  Compartments are soft.  She is extremely tender along the midportion of the hamstring with no palpable defect.  Lying prone, she feels comfortable with the knee flexed at 90 degrees, unable to test strength with hamstring resistance secondary to pain.  Minimal tenderness along the ischial tuberosity with no palpable defects.  She has good ankle plantarflexion dorsiflexion of the right lower extremity.  She is neurovascular intact.  Skin:    General: Skin is warm.     Findings: No rash.  Neurological:     General: No focal deficit present.     Mental Status: She is alert and oriented to person, place, and time.  Psychiatric:        Behavior: Behavior normal.        Thought Content: Thought content normal.    ED Results / Procedures / Treatments   Labs (all labs ordered are listed, but only abnormal results are displayed) Labs Reviewed - No data to display  EKG None  Radiology No results found.  Procedures Procedures   Medications Ordered in ED Medications  ondansetron (ZOFRAN-ODT) 4 MG disintegrating tablet (has no administration in time range)  HYDROmorphone (DILAUDID) injection 1 mg (1 mg Intramuscular Given 02/01/21 2256)  orphenadrine (NORFLEX) injection 60 mg (60 mg Intramuscular Given 02/01/21 2257)  diphenhydrAMINE (BENADRYL) capsule 25 mg (25 mg Oral Given 02/01/21 2256)  ondansetron (ZOFRAN) tablet 4 mg (4 mg Oral Given 02/01/21 2306)    ED Course  I have reviewed the triage vital signs and the nursing notes.  Pertinent labs & imaging results that were available during my care of the  patient were reviewed by me and considered in my medical decision making (see chart for details).    MDM Rules/Calculators/A&P 50 year old female with acute right posterior thigh pain.  History and exam consistent with hamstring strain.  No palpable defect in the muscle.  Unable to test muscle strength based on acute injury/pain.  No bruising or signs of compartment syndrome.  Based on exam findings do not suspect any bony injury to the femur.  Discussed with patient possible avulsion along the ischial tuberosity but we elected not to get x-rays today.  Was given IM Dilaudid and IM Norflex.  Symptoms did seem to improve with Ace wrap, compression and ice.  Patient will be calling her orthopedist tomorrow to schedule follow-up appointment.  She is given a note to remain out of work.  She is educated on compartment syndrome and signs and symptoms return to the ER for.  Patient is given prescription for oxycodone, methocarbamol, Zofran, ibuprofen. Final Clinical Impression(s) / ED Diagnoses Final diagnoses:  Tear of right hamstring    Rx / DC Orders ED Discharge Orders          Ordered    oxyCODONE (ROXICODONE) 5 MG immediate release tablet  Every 6 hours PRN        02/01/21 2319    methocarbamol (ROBAXIN) 500 MG tablet  Every 6 hours PRN        02/01/21 2319    ondansetron (ZOFRAN ODT) 4 MG disintegrating tablet  Every 8 hours PRN        02/01/21 2319    ibuprofen (ADVIL) 600 MG tablet  Every 6 hours PRN        02/01/21 2319             Evon Slack, PA-C 02/01/21 2329    Delton [place], MD 02/04/21 [phone]

## 2021-02-01 NOTE — ED Triage Notes (Signed)
Pt was playing softball running to the base and felt a pop in the back of her right thigh, pt has been unable to ambulate since. Pt states her whole leg is cramping.

## 2021-02-05 ENCOUNTER — Encounter: Payer: Managed Care, Other (non HMO) | Admitting: Internal Medicine

## 2021-02-12 ENCOUNTER — Other Ambulatory Visit: Payer: Self-pay | Admitting: Obstetrics and Gynecology

## 2021-02-12 DIAGNOSIS — Z1231 Encounter for screening mammogram for malignant neoplasm of breast: Secondary | ICD-10-CM

## 2021-02-14 ENCOUNTER — Encounter: Payer: Managed Care, Other (non HMO) | Admitting: Podiatry

## 2021-02-26 ENCOUNTER — Encounter: Payer: Self-pay | Admitting: Podiatry

## 2021-02-28 ENCOUNTER — Encounter: Payer: Managed Care, Other (non HMO) | Admitting: Podiatry

## 2021-03-07 ENCOUNTER — Ambulatory Visit: Payer: Managed Care, Other (non HMO) | Admitting: Podiatry

## 2021-03-08 ENCOUNTER — Telehealth: Payer: Managed Care, Other (non HMO) | Admitting: Family Medicine

## 2021-03-08 ENCOUNTER — Telehealth (INDEPENDENT_AMBULATORY_CARE_PROVIDER_SITE_OTHER): Payer: Managed Care, Other (non HMO) | Admitting: Internal Medicine

## 2021-03-08 ENCOUNTER — Encounter: Payer: Self-pay | Admitting: Internal Medicine

## 2021-03-08 DIAGNOSIS — J069 Acute upper respiratory infection, unspecified: Secondary | ICD-10-CM

## 2021-03-08 DIAGNOSIS — J0140 Acute pansinusitis, unspecified: Secondary | ICD-10-CM

## 2021-03-08 MED ORDER — FLUTICASONE PROPIONATE 50 MCG/ACT NA SUSP: 2.0000 | Freq: Every day | NASAL | 0 refills | Status: DC

## 2021-03-08 MED ORDER — PREDNISONE 10 MG PO TABS: ORAL_TABLET | ORAL | 0 refills | Status: DC

## 2021-03-08 MED ORDER — AZITHROMYCIN 250 MG PO TABS: ORAL_TABLET | ORAL | 0 refills | Status: DC

## 2021-03-08 NOTE — Patient Instructions (Signed)

## 2021-03-08 NOTE — Progress Notes (Signed)

## 2021-03-08 NOTE — Progress Notes (Signed)
Virtual Visit via Video Note  I connected with Tasha Miller on 03/08/21 at  4:00 PM EST by a video enabled telemedicine application and verified that I am speaking with the correct person using two identifiers.  Location: Patient: In her car Provider: Office  Persons participating in this video call: Nicki Reaper, NP and Rexene Agent   I discussed the limitations of evaluation and management by telemedicine and the availability of in person appointments. The patient expressed understanding and agreed to proceed.  History of Present Illness:   Pt reports sinus pressure, nasal congestion and cough. This started 5 days ago.  The headache is located in her forehead and her cheeks.  She is blowing green mucus out of her nose.  The cough is productive of green mucus.  She denies runny nose, ear pain, sore throat, shortness of breath, nausea, vomiting or diarrhea.  She denies fever, chills or body aches.  She has not had sick contacts that she is aware of but she does work for EMS.  She is up-to-date on her flu and COVID vaccines.  She has tried Flonase and Mucinex OTC with minimal relief of symptoms.  She does not smoke.   Past Medical History:  Diagnosis Date   Asthma    PONV (postoperative nausea and vomiting)    Rubella    SVT (supraventricular tachycardia) (HCC)     Current Outpatient Medications  Medication Sig Dispense Refill   albuterol (VENTOLIN HFA) 108 (90 Base) MCG/ACT inhaler Inhale 2 puffs into the lungs as needed for wheezing or shortness of breath. 1 each 2   azithromycin (ZITHROMAX) 250 MG tablet Take 2 tabs today, then 1 tab daily x 4 days 6 tablet 0   cyanocobalamin (,VITAMIN B-12,) 1000 MCG/ML injection Inject 1,000 mcg into the muscle every 14 (fourteen) days.     estradiol (ESTRACE) 1 MG tablet      fluticasone (FLONASE) 50 MCG/ACT nasal spray Place 2 sprays into both nostrils daily. 16 g 0   ibuprofen (ADVIL) 600 MG tablet Take 1 tablet (600 mg total) by mouth every 6  (six) hours as needed for moderate pain. 30 tablet 0   lamoTRIgine (LAMICTAL) 25 MG tablet Take 3 tablets (75 mg total) by mouth daily. (Patient not taking: Reported on 09/06/2020) 270 tablet 3   methocarbamol (ROBAXIN) 500 MG tablet Take 1 tablet (500 mg total) by mouth every 6 (six) hours as needed for muscle spasms. 30 tablet 0   nirmatrelvir/ritonavir EUA (PAXLOVID) TABS GFR is > 60  nirmatrelvir (150 mg) 2 tabs PO BID for 5 days and ritonavir (100 mg) 1 tab PO BID for 5 days. 30 tablet 0   ondansetron (ZOFRAN ODT) 4 MG disintegrating tablet Take 1 tablet (4 mg total) by mouth every 8 (eight) hours as needed for nausea or vomiting. 20 tablet 0   oxyCODONE (ROXICODONE) 5 MG immediate release tablet Take 1-2 tablets (5-10 mg total) by mouth every 6 (six) hours as needed. 30 tablet 0   predniSONE (DELTASONE) 10 MG tablet Take 6 tabs on day 1, 5 tabs on day 2, 4 tabs on day 3, 3 tabs on day 4, 2 tabs on day 5, 1 tab on day 6 21 tablet 0   progesterone (PROMETRIUM) 100 MG capsule Take 100 mg by mouth at bedtime.     testosterone cypionate (DEPOTESTOTERONE CYPIONATE) 100 MG/ML injection Depo-Testosterone 100 mg/mL intramuscular oil  Inject 0.5 mL every 4 weeks by intramuscular route.     traMADol (  ULTRAM) 50 MG tablet Take 1 tablet (50 mg total) by mouth every 8 (eight) hours as needed. 30 tablet 0   triamcinolone cream (KENALOG) 0.1 % APPLY TO AFFECTED AREA OF SKIN TWICE A DAY AS NEEDED TAPER USE AS ABLE (Patient not taking: Reported on 09/06/2020)     Vitamin D, Ergocalciferol, (DRISDOL) 1.25 MG (50000 UT) CAPS capsule TAKE 1 CAPSULE BY MOUTH WEEKLY     No current facility-administered medications for this visit.    Allergies  Allergen Reactions   Latex Itching and Swelling   Sulfa Antibiotics Itching and Nausea And Vomiting   Amoxicillin-Pot Clavulanate    Sulfonamide Derivatives Nausea And Vomiting    Family History  Problem Relation Age of Onset   Cancer Maternal Aunt        thyroid  cancer   Cancer Maternal Uncle    Diabetes Paternal Aunt    Heart disease Neg Hx    Stroke Neg Hx    Breast cancer Neg Hx     Social History   Socioeconomic History   Marital status: Media planner    Spouse name: Not on file   Number of children: Not on file   Years of education: Not on file   Highest education level: Not on file  Occupational History   Not on file  Tobacco Use   Smoking status: Never   Smokeless tobacco: Never  Vaping Use   Vaping Use: Never used  Substance and Sexual Activity   Alcohol use: No   Drug use: No   Sexual activity: Yes  Other Topics Concern   Not on file  Social History Narrative   Not on file   Social Determinants of Health   Financial Resource Strain: Not on file  Food Insecurity: Not on file  Transportation Needs: Not on file  Physical Activity: Not on file  Stress: Not on file  Social Connections: Not on file  Intimate Partner Violence: Not on file     Constitutional: Patient reports headache.  Denies fever, malaise, fatigue, or abrupt weight changes.  HEENT: Pt reports sinus pressure, nasal congestion. Denies eye pain, eye redness, ear pain, ringing in the ears, wax buildup, runny nose, bloody nose, or sore throat. Respiratory: Pt reports cough. Denies difficulty breathing, shortness of breath, or sputum production.   Cardiovascular: Denies chest pain, chest tightness, palpitations or swelling in the hands or feet.  Gastrointestinal: Denies abdominal pain, bloating, constipation, diarrhea or blood in the stool.   No other specific complaints in a complete review of systems (except as listed in HPI above).  Observations/Objective:   Wt Readings from Last 3 Encounters:  02/01/21 142 lb (64.4 kg)  05/17/20 147 lb (66.7 kg)  01/18/20 147 lb (66.7 kg)    General: Appears her stated age, ill-appearing but in NAD. HEENT: Head: normal shape and size, frontal maxillary sinus tenderness noted; Nose: Congestion noted;  Throat/Mouth: Hoarseness noted Pulmonary/Chest: Normal effort. No respiratory distress.  Neurological: Alert and oriented.   BMET    Component Value Date/Time   NA 141 05/17/2020 1059   NA 140 02/28/2016 0900   NA 144 07/10/2011 1025   K 4.1 05/17/2020 1059   K 3.6 07/10/2011 1025   CL 106 05/17/2020 1059   CL 108 (H) 07/10/2011 1025   CO2 30 05/17/2020 1059   CO2 24 07/10/2011 1025   GLUCOSE 96 05/17/2020 1059   GLUCOSE 142 (H) 07/10/2011 1025   BUN 8 05/17/2020 1059   BUN 10 02/28/2016 0900  BUN 10 07/10/2011 1025   CREATININE 0.87 05/17/2020 1059   CREATININE 1.00 06/26/2018 1521   CALCIUM 9.3 05/17/2020 1059   CALCIUM 8.7 07/10/2011 1025   GFRNONAA >60 12/01/2018 2325   GFRNONAA >60 07/10/2011 1025   GFRAA >60 12/01/2018 2325   GFRAA >60 07/10/2011 1025    Lipid Panel     Component Value Date/Time   CHOL 217 (H) 01/18/2020 1542   CHOL 209 (H) 10/16/2017 1438   TRIG 227.0 (H) 01/18/2020 1542   HDL 43.50 01/18/2020 1542   HDL 42 10/16/2017 1438   CHOLHDL 5 01/18/2020 1542   VLDL 45.4 (H) 01/18/2020 1542   LDLCALC  06/26/2018 1521     Comment:     . LDL cholesterol not calculated. Triglyceride levels greater than 400 mg/dL invalidate calculated LDL results. . Reference range: <100 . Desirable range <100 mg/dL for primary prevention;   <70 mg/dL for patients with CHD or diabetic patients  with > or = 2 CHD risk factors. Marland Kitchen LDL-C is now calculated using the Martin-Hopkins  calculation, which is a validated novel method providing  better accuracy than the Friedewald equation in the  estimation of LDL-C.  Horald Pollen et al. Lenox Ahr. 1583;094(07): 2061-2068  (http://education.[employer].com/faq/FAQ164)     CBC    Component Value Date/Time   WBC 7.4 12/20/2020 1105   WBC 8.5 05/17/2020 1059   RBC 5.55 (H) 12/20/2020 1105   RBC 5.09 05/17/2020 1059   HGB 16.2 (H) 12/20/2020 1105   HCT 47.4 (H) 12/20/2020 1105   PLT 209 12/20/2020 1105   MCV 85  12/20/2020 1105   MCV 88 07/10/2011 1025   MCH 29.2 12/20/2020 1105   MCH 29.0 12/01/2018 2325   MCHC 34.2 12/20/2020 1105   MCHC 33.2 05/17/2020 1059   RDW 12.3 12/20/2020 1105   RDW 12.6 07/10/2011 1025   LYMPHSABS 2.5 12/20/2020 1105   MONOABS 0.8 12/01/2018 2325   EOSABS 0.3 12/20/2020 1105   BASOSABS 0.1 12/20/2020 1105    Hgb A1C Lab Results  Component Value Date   HGBA1C 5.9 01/18/2020       Assessment and Plan:  Acute Bacterial Pansinusitis:  Can use a Nettie pot which can be purchased from your local pharmacy Rx for Pred taper x6 days Rx for Azithromycin x5 days  Return precautions discussed  Follow Up Instructions:    I discussed the assessment and treatment plan with the patient. The patient was provided an opportunity to ask questions and all were answered. The patient agreed with the plan and demonstrated an understanding of the instructions.   The patient was advised to call back or seek an in-person evaluation if the symptoms worsen or if the condition fails to improve as anticipated.   Nicki Reaper, NP

## 2021-03-09 ENCOUNTER — Telehealth: Payer: Self-pay

## 2021-03-09 MED ORDER — AZITHROMYCIN 250 MG PO TABS: ORAL_TABLET | ORAL | 0 refills | Status: DC

## 2021-03-09 NOTE — Telephone Encounter (Signed)
Copied from CRM [id number]. Topic: General - Other >> Mar 09, 2021 12:21 PM Gaetana Michaelis A wrote: Reason for CRM: The patient has been told by a member of their pharmacy's staff that the patient's prescription request for azithromycin (ZITHROMAX) 250 MG tablet [[id number]]  must be called in by Prov. Baity of a member of clinical staff  The patient has shown them their MyChart and been told that no prescription for the medication has been submitted by the practice   The patient would like for a member of staff to contact their pharmacy when possible  [id number] [place], [place] - [address]  Phone:  [phone] Fax:  [phone]  Please contact further when possible

## 2021-03-12 ENCOUNTER — Encounter: Payer: Self-pay | Admitting: Internal Medicine

## 2021-03-13 ENCOUNTER — Encounter: Payer: Self-pay | Admitting: Internal Medicine

## 2021-03-13 ENCOUNTER — Ambulatory Visit: Payer: Self-pay

## 2021-03-13 MED ORDER — BUDESONIDE 0.5 MG/2ML IN SUSP: 0.5000 mg | Freq: Every day | RESPIRATORY_TRACT | 0 refills | Status: DC

## 2021-03-13 MED ORDER — HYDROCODONE BIT-HOMATROP MBR 5-1.5 MG/5ML PO SOLN: 5.0000 mL | Freq: Three times a day (TID) | ORAL | 0 refills | Status: DC | PRN

## 2021-03-13 MED ORDER — DOXYCYCLINE HYCLATE 100 MG PO TABS: 100.0000 mg | ORAL_TABLET | Freq: Two times a day (BID) | ORAL | 0 refills | Status: DC

## 2021-03-13 NOTE — Telephone Encounter (Signed)
Pt with wheezing, SOB at rest and with activity calling requesting cough medicine and Pulmicort nebulizer. Pt coughed frequently during call. Could hear congestion during cough. Pt coughing up yellow phlegm.  Pt also c/o upper back pain that worsens with movement and coughing. Pt sounded ill over the phone and advised her to go to UC or ER in the next hour. Pt refused stating she knows what she needs.  Attempted to call and team message the [facility] at [facility] with no answer. Please call pt and advise.         Reason for Disposition  [1] MODERATE asthma attack (e.g., SOB at rest, speaks in phrases, audible wheezes) AND [2] not resolved after 2 or 3 inhaler or nebulizer treatments given 20 minutes apart  Answer Assessment - Initial Assessment Questions 1. RESPIRATORY STATUS: "Describe your breathing?" (e.g., wheezing, shortness of breath, unable to speak, severe coughing)      Wheezing, cough, SOB at rest, severe cough with congestion thick phlegm 2. ONSET: "When did this asthma attack begin?"      Today 3. TRIGGER: "What do you think triggered this attack?" (e.g., URI, exposure to pollen or other allergen, tobacco smoke)      URI 4. PEAK EXPIRATORY FLOW RATE (PEFR): "Do you use a peak flow meter?" If Yes, ask: "What's the current peak flow? What's your personal best peak flow?"      no 5. SEVERITY: "How bad is this attack?"    - MILD: No SOB at rest, mild SOB with walking, speaks normally in sentences, can lie down, no retractions, pulse < 100. (GREEN Zone: PEFR 80-100%)   - MODERATE: SOB at rest, SOB with minimal exertion and prefers to sit, cannot lie down flat, speaks in phrases, mild retractions, audible wheezing, pulse 100-120. (YELLOW Zone: PEFR 50-79%)    - SEVERE: Struggling for each breath, speaks in single words, struggling to breathe, sitting hunched forward, retractions, usually loud wheezing, sometimes minimal wheezing because of decreased air movement, pulse > 120. (RED Zone: PEFR <  50%).      moderate 6. ASTHMA MEDICINES:  "What treatments have you given?"    - INHALED QUICK RELIEF (RESCUE): "What is your inhaled quick-relief medicine?" (e.g., albuterol, salbutamol) "Do you use an inhaler or a nebulizer?" "How frequently have you been using?"   - CONTROLLER (LONG-TERM-CONTROL): "Do you take an inhaled steroid? (e.g., Asmanex, Flovent, Pulmicort, Qvar)     Albuterol nebulizer and Duoneb without effect 7. INHALED QUICK-RELIEF TREATMENTS FOR THIS ATTACK: "What treatments have you given yourself so far?" and "How many and how often?" If using an inhaler, ask, "How many puffs?" Note: Routine treatments are 2 puffs every 4 hours as needed. Rescue treatments are 4 puffs repeated every 20 minutes, up to three times as needed.      Albuterol then Duoneb 8. OTHER SYMPTOMS: "Do you have any other symptoms? (e.g., chest pain, coughing up yellow sputum, fever, runny nose)     Upper back pain, yellow sputum, runny nose 9. O2 SATURATION MONITOR:  "Do you use an oxygen saturation monitor (pulse oximeter) at home?" If Yes, "What is your reading (oxygen level) today?" "What is your usual oxygen saturation reading?" (e.g., 95%)     N/a 10. PREGNANCY: "Is there any chance you are pregnant?" "When was your last menstrual period?"       N/a  Protocols used: Asthma Attack-A-AH

## 2021-03-19 NOTE — Telephone Encounter (Signed)
Hycodan and Pulmicort were sent in on 11/22. Does she need a refill?

## 2021-03-20 ENCOUNTER — Ambulatory Visit
Admission: RE | Admit: 2021-03-20 | Discharge: 2021-03-20 | Disposition: A | Discharge status: Home / Self Care | Payer: Managed Care, Other (non HMO) | Source: Ambulatory Visit | Attending: Internal Medicine | Admitting: Internal Medicine

## 2021-03-20 ENCOUNTER — Encounter: Payer: Self-pay | Admitting: Internal Medicine

## 2021-03-20 ENCOUNTER — Ambulatory Visit: Payer: Managed Care, Other (non HMO) | Admitting: Internal Medicine

## 2021-03-20 ENCOUNTER — Ambulatory Visit
Admission: RE | Admit: 2021-03-20 | Discharge: 2021-03-20 | Disposition: A | Discharge status: Home / Self Care | Payer: Managed Care, Other (non HMO) | Attending: Internal Medicine | Admitting: Internal Medicine

## 2021-03-20 ENCOUNTER — Other Ambulatory Visit: Payer: Self-pay

## 2021-03-20 VITALS — BP 112/71 | HR 75 | Temp 98.6°F | Ht 64.0 in | Wt 138.8 lb

## 2021-03-20 DIAGNOSIS — J201 Acute bronchitis due to Hemophilus influenzae: Secondary | ICD-10-CM | POA: Diagnosis not present

## 2021-03-20 DIAGNOSIS — R051 Acute cough: Secondary | ICD-10-CM

## 2021-03-20 DIAGNOSIS — R0602 Shortness of breath: Secondary | ICD-10-CM

## 2021-03-20 MED ORDER — PULMICORT FLEXHALER 90 MCG/ACT IN AEPB: 1.0000 | INHALATION_SPRAY | Freq: Two times a day (BID) | RESPIRATORY_TRACT | 1 refills | Status: DC

## 2021-03-20 MED ORDER — HYDROCOD POLST-CPM POLST ER 10-8 MG/5ML PO SUER: 5.0000 mL | Freq: Two times a day (BID) | ORAL | 0 refills | Status: DC | PRN

## 2021-03-20 MED ORDER — LEVOFLOXACIN 750 MG PO TABS: 750.0000 mg | ORAL_TABLET | Freq: Every day | ORAL | 0 refills | Status: DC

## 2021-03-20 NOTE — Progress Notes (Signed)
HPI  Pt presents to the clinic today with c/o cough, chest congestion and shortness of breath.  She reports this started 1.5 weeks ago.  The cough is productive of yellow mucus.  She is having some wheezing and shortness of breath.  She denies chest pain.  She had URI symptoms in the beginning which was treated with a Z-Pak, prednisone.  She has been using her albuterol inhaler and Pulmicort nebulizer with minimal relief of symptoms.  She denies current headache, runny nose, nasal congestion, ear pain, sore throat, chest pain, nausea, vomiting, diarrhea, fever, chills or body aches.  She has had sick contacts diagnosed with the flu.  She has a history of asthma but does not smoke.  She is up-to-date on her flu and COVID-vaccine.  Review of Systems      Past Medical History:  Diagnosis Date   Asthma    PONV (postoperative nausea and vomiting)    Rubella    SVT (supraventricular tachycardia) (HCC)     Family History  Problem Relation Age of Onset   Cancer Maternal Aunt        thyroid cancer   Cancer Maternal Uncle    Diabetes Paternal Aunt    Heart disease Neg Hx    Stroke Neg Hx    Breast cancer Neg Hx     Social History   Socioeconomic History   Marital status: Media planner    Spouse name: Not on file   Number of children: Not on file   Years of education: Not on file   Highest education level: Not on file  Occupational History   Not on file  Tobacco Use   Smoking status: Never   Smokeless tobacco: Never  Vaping Use   Vaping Use: Never used  Substance and Sexual Activity   Alcohol use: No   Drug use: No   Sexual activity: Yes  Other Topics Concern   Not on file  Social History Narrative   Not on file   Social Determinants of Health   Financial Resource Strain: Not on file  Food Insecurity: Not on file  Transportation Needs: Not on file  Physical Activity: Not on file  Stress: Not on file  Social Connections: Not on file  Intimate Partner Violence: Not on  file    Allergies  Allergen Reactions   Latex Itching and Swelling   Sulfa Antibiotics Itching and Nausea And Vomiting   Amoxicillin-Pot Clavulanate    Sulfonamide Derivatives Nausea And Vomiting     Constitutional: Positive fatigue. Denies headache, fever, abrupt weight changes.  HEENT:  Denies eye redness, eye pain, pressure behind the eyes, facial pain, nasal congestion, ear pain, ringing in the ears, wax buildup, runny nose or sore throat. Respiratory: Positive cough and shortness of breath. Denies difficulty breathing or shortness of breath.  Cardiovascular: Denies chest pain, chest tightness, palpitations or swelling in the hands or feet.   No other specific complaints in a complete review of systems (except as listed in HPI above).  Objective:   BP 112/71 (BP Location: Right Arm, Patient Position: Sitting, Cuff Size: Normal)   Pulse 75   Temp 98.6 F (37 C) (Temporal)   Ht 5\' 4"  (1.626 m)   Wt 138 lb 12.8 oz (63 kg)   SpO2 100%   BMI 23.82 kg/m   Wt Readings from Last 3 Encounters:  02/01/21 142 lb (64.4 kg)  05/17/20 147 lb (66.7 kg)  01/18/20 147 lb (66.7 kg)     General:  Appears her stated age, ill-appearing but in NAD. HEENT: Head: normal shape and size; Nose: Congestion noted; Throat/Mouth: Hoarseness noted.  Neck: No cervical lymphadenopathy.  Cardiovascular: Normal rate and rhythm. S1,S2 noted.  No murmur, rubs or gallops noted.  Pulmonary/Chest: Normal effort and positive vesicular breath sounds with intermittent expiratory wheezing noted. No No wheezes, rales or ronchi noted.       Assessment & Plan:  Acute Bronchitis::  Get some rest and drink plenty of wateChest x-ray today Rx for Levaquin 750 mg p.o. x7 days Rx for Tussionex as needed for cough-sedation caution given Rx for Pulmicort inhaler Consider CT chest if symptoms persist or worsen  RTC as needed or if symptoms persist.   Nicki Reaper, NP

## 2021-03-21 ENCOUNTER — Encounter: Payer: Self-pay | Admitting: Internal Medicine

## 2021-03-21 NOTE — Patient Instructions (Signed)
???????? ?????? ????? ??? ???????? Acute Bronchitis, Adult  ???????? ?????? ????? ?? ?????? ????? ?? ????? ?????? ???????? (????? ????????) ???? ????? ?? ?????? ???????? ????????. ????? ?????? ??? ??? ????? ?????? ?????? ??????? ?????? ????? ???? ????? ???????. ??? ?? ???? ????? ?? ?????? ????? ?? ?????? ???? ????? (????). ????????? ?????? ????? ?? ????? ???????. ?? ????? ????? ?????? ???? ????. ???? ?? ???? ??? ?????? ??? ???????? ????????? ?? ????? ?? ?????? ??????. ?? ????? ??? ??????? ???? ?? ???? ??? ?????? ????? ???????? ??????? ?????????? ?????? ?????? ????? ????:  ??????? ????? ???????????. ?????? ?????? ?????? ???? ?????? ?? ??????? ??????? ????? ?????.  ????????. ???? ??? ????? ??? ??????.  ??????? ???? ???? ?????? ?????: ? ???? ??????? ?? ???? ?? ????? ???? ?????. ? ??????? ????? ??????. ? ??????? ???? ????? ??? ??????? ???????? ???????? ?? ??????? ?? ?????? ???????. ? ???? ??????? ???? ?? ??????? ??????? ?? ????????. ?? ????? ????? ?????? ???? ????? ??????? ??????? ?? ?????? ??????? ???? ??????:  ??? ???? ?????? ?? ?????? ????? ????? ????? ???? ???????.  ??????? ????? ???? ???? ??? ??????? ???????? ??? ?????. ?? ?????? ?????? ?? ???????? ???? ??????? ??????? ???? ?????? ?? ???:  ???????. ??? ?????? ??? ???? ???? ???? ?? ???? ?? ???? ?? ????? (????).  ????.  ??? ?? ?????? ?????.  ???? ?? ???? ?? ?????? ?? ????? (?????? ?????).  ??? ??????.  ????? ?????? ??? ?? ??? ?????? ????? ?? ??? ?? ?????. ??? ????? ??? ??????? ????? ??? ?????? ???? ????? ???:  ??????? ???? ????? ???? ??????? ?????.  ??? ????. ?? ???? ???? ????? ????? ??? ?? ??? ???? ???????? ???? ????? ???? ???? ??? ???????? ??????. ????? ??? ??????:  ??? ?????? ?????.  ????? ???? ?? ?????? ????? ?? ???? ??????? ?? ????.  ???? ????? ????? ???????? ?? ???.  ?????? ????.  ?????? ??????? ??? ?????. ??? ?????? ??? ??????? ????? ???? ????? ??????? ????????? ?????? ????? ?? ???? ????? ??? ????. ??? ????? ???? ???????  ?????? ??? ???:  ????? ??? ??????? ???????? ??? ????? ?????? ??? ???? ?????? ?? ??????.  ??????? ???? ??????? (????) ?????? ???? ?????? ??? ???? ????? ?????? ????.  ??????? ????? ?? ????. ????? ???????? ?????? ??? ?? ????? ??? ?????? ???????? ??? ?????? ???? ????.  ????? ???? ???? ??? ????? ?????? ????? ???????? (???? ???? ??????).  ????? ???? ??? ?? ?????? ?? ????? (???? ??????). ????????????? ??????? ???? ?????? ??? ??? ????. ???? ????????? ??????? ?? ???????:   ????? ??????? ??? ??????? ???? ??????? ??????? ????? ????? ????? ???? ?? ??? ???? ????.  ?????? ?????? ?? ???? ????? ?????? ?? ?????? ??? ??????? ???? ??????? ?????? ??????? ??.  ????? ??????? ??????? (10 ??) ?? ??? ????? ??? ????? ??????? ?? ?????? ?????.  ???? ????? ????? ?? ??????? ???? ??? ???? ???? ??????.  ?? ?????? ?????? ????? ??? ????????? ?? ?????. ????? ??? ??????? ???? ????? ?????? ??????? ???????????? ??? ??????? ???????????. ????? ???? ??????? ?????? ??? ??? ????? ??? ???????? ??????? ?? ???????.  ???? ??? ??? ???? ?? ??????.  ???? ?????? ?????? ??????? ??? ??????? ??????? ??????? ??????. ?????? ???? ??????? ?????? ???? ??????? ?????? ??.  ????? ????? ?????? ????????. ???? ??? ???. ??? ????? ??????? ?? ????  ???? ?????? ??????? ???? ?????? ??????:  ???? ???? ?????? ?????? ???????? ???? 20 ????? ??? ?????. ???? ?? ????? ????? ????????? ?????? ???? ??????.  ???? ?????? ????? ????? ????? ?????.  ???? ??? ??? ?? ???? ?? ????? ?????.  ???? ??????? ?????? ?? ????? ?????? ??????????. ??? ????????? ???? ??? ????? ?????.  ???? ??? ???? ???? ?????????? ?? ???. ???? ?????? ??????? ?????? ?? ??????? ???????:  ??? ???? ??????? ???? ????? ???? ??? ???? ???????.  ???? ????? ?? ????? ?????? ?? ??????.  ??? ????? ??? ????? ????? ???? ??????.  ??????? ????. ???? ???????? ????? ?? ???????? ???????:  ???? ?? ?? ??????.  ?????? ???? ?? ?????.  ???????? ?? ??? ???? ?? ??????.  ????? ??????? ?? ????? ??? ??? ??? ???????.  ????????  ?? ????? ????.  ??????? ???? ?? ??????? ?? ???????. ???? ???? ??? ??????? ??? ????? ????? ????? ???? ???? ?????. ??? ?? ????? ??? ?? ???? ???????. ?? ???? ???????? ?????? ??? ?????. ???? ?????? ??????? ??????? (  911 ?? ???????? ??????? ?????????). ?? ??? ??????? ????? ??? ????????. ????  ???????? ?????? ????? ?? ?????? ?? ????? ?????? ???????? (????? ????????) ???? ???? ?? ?????? ???????? ????????. ????? ?????? ??? ??? ????? ?????? ?????? ??????? ?????? ????? ???? ????? ???????.  ????? ??? ??????? ?? ????? ??? ????? ?????? ??? ???? ?????? ?? ??????.  ????? ??????? ??? ??????? ???? ??????? ??????? ????? ????? ????? ???? ?? ??? ???? ????.  ?? ?????? ?????? ????? ??? ????????? ?? ?????. ????? ??? ??????? ???? ????? ?????? ??????? ???????????? ??? ??????? ???????????. ????? ???? ??????? ?????? ??? ??? ????? ??? ???????? ??????? ?? ???????.  ???? ???????? ??????? ?????? ??? ?? ??? ??????? ??? ???????. ??? ????? ?? ??? ????????? ?? ???? ?????? ????????? ???? ?????? ???? ??????? ??????. ???? ?? ?????? ??? ????? ???? ?? ???? ?? ???? ??????? ??????.? Document Revised: 08/30/2020 Document Reviewed: 08/30/2020 Elsevier Patient Education  2022 [employer].

## 2021-04-04 ENCOUNTER — Other Ambulatory Visit: Payer: Self-pay | Admitting: Internal Medicine

## 2021-04-04 NOTE — Telephone Encounter (Signed)
Requested Prescriptions  Pending Prescriptions Disp Refills   budesonide (PULMICORT) 0.5 MG/2ML nebulizer solution [Pharmacy Med Name: BUDESONIDE 0.5 MG/2 ML SUSP] 20 mL 0    Sig: TAKE 2 MLS (0.5 MG TOTAL) BY NEBULIZATION DAILY.     Pulmonology:  Corticosteroids Passed - 04/04/2021 12:33 PM      Passed - Valid encounter within last 12 months    Recent Outpatient Visits          2 weeks ago Acute bronchitis due to Haemophilus influenzae   [facility] [place], Salvadore [place], NP   3 weeks ago Acute non-recurrent pansinusitis   [facility] [place], Salvadore [place], NP   7 months ago Viral sinorhinitis   [facility] [place], Salvadore [place], [place]

## 2021-04-18 ENCOUNTER — Other Ambulatory Visit: Payer: Self-pay | Admitting: Internal Medicine

## 2021-04-18 NOTE — Telephone Encounter (Signed)
Requested Prescriptions  Pending Prescriptions Disp Refills   budesonide (PULMICORT) 0.5 MG/2ML nebulizer solution [Pharmacy Med Name: BUDESONIDE 0.5 MG/2 ML SUSP] 360 mL 1    Sig: USE 1 VIAL IN NEBULIZER ONCE DAILY     Pulmonology:  Corticosteroids Passed - 04/18/2021  8:53 AM      Passed - Valid encounter within last 12 months    Recent Outpatient Visits          4 weeks ago Acute bronchitis due to Haemophilus influenzae   [facility] [place], Salvadore [place], NP   1 month ago Acute non-recurrent pansinusitis   [facility] [place], Salvadore [place], NP   7 months ago Viral sinorhinitis   [facility] [place], Salvadore [place], [place]

## 2021-05-17 ENCOUNTER — Other Ambulatory Visit: Payer: Self-pay | Admitting: Internal Medicine

## 2021-06-03 IMAGING — CT CT ABD-PELV W/ CM
1 of 3 series · 14 of 32 positions shown, 19 images · IV contrast (APPLIED)
Comparison: 12/01/2018

CLINICAL DATA: Left upper quadrant pain intermittently for 2 months

EXAM:
CT ABDOMEN AND PELVIS WITH CONTRAST
TECHNIQUE: Multidetector CT imaging of the abdomen and pelvis was performed
using the standard protocol following bolus administration of
intravenous contrast.
CONTRAST:  100mL OMNIPAQUE IOHEXOL 300 MG/ML  SOLN

[Series 2: axial st · axial · 0.65mm/px · z∈[-787,-392]mm · 14 of 89 slices shown, 19 images]
[im 5/89  soft-tissue]
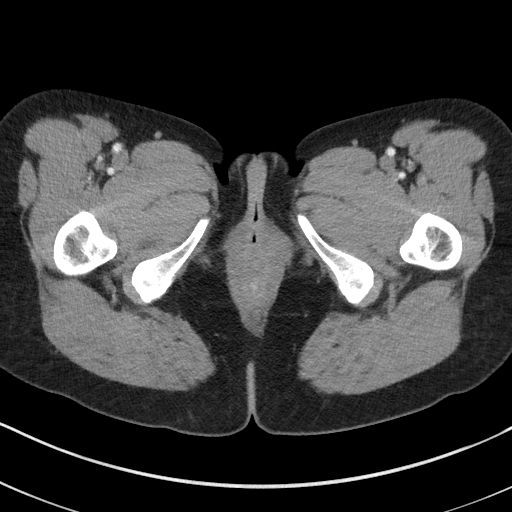
[im 5/89  bone]
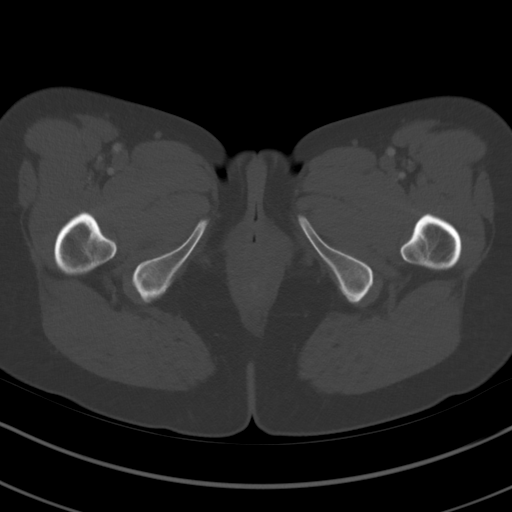
[im 14/89  soft-tissue]
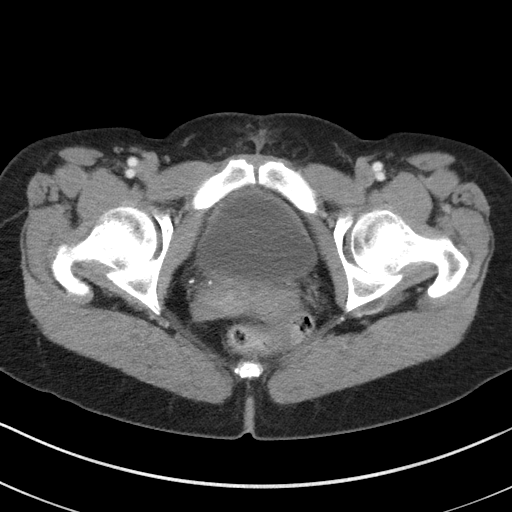
[im 19/89  soft-tissue]
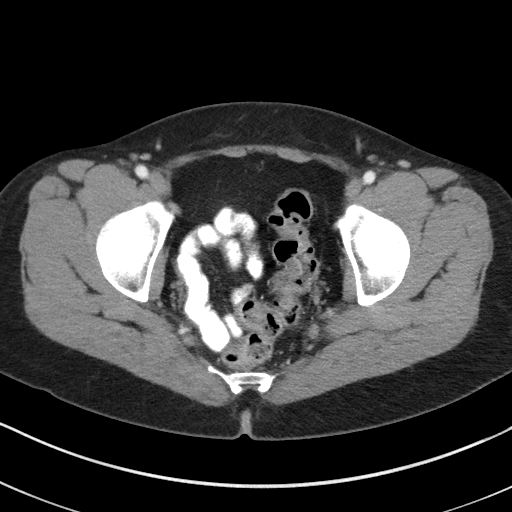
[im 24/89  soft-tissue]
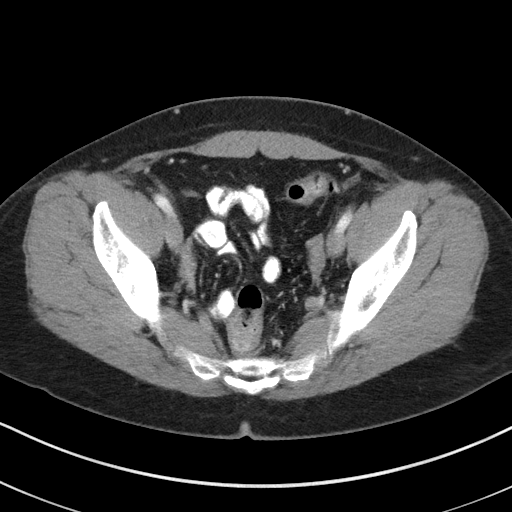
[im 33/89  soft-tissue]
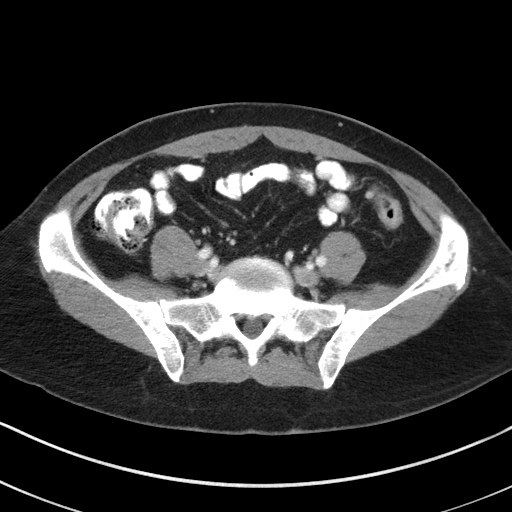
[im 38/89  soft-tissue]
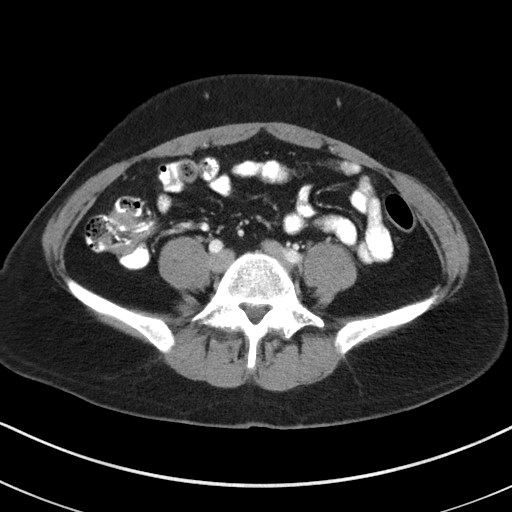
[im 47/89  soft-tissue]
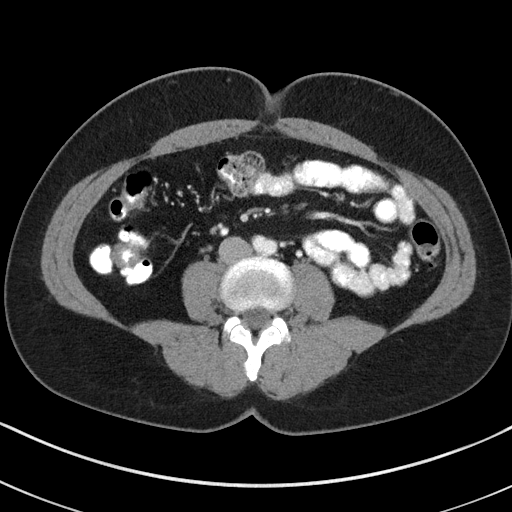
[im 51/89  soft-tissue]
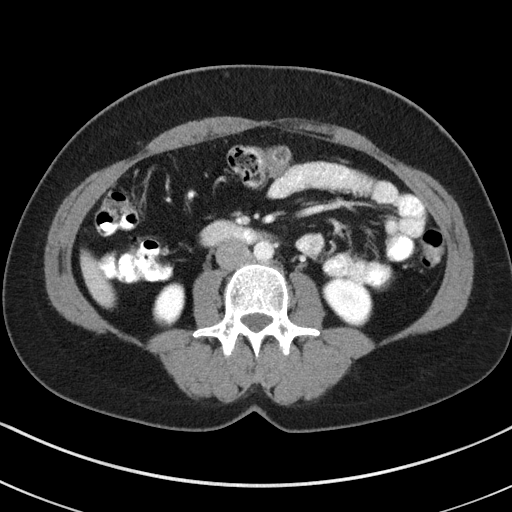
[im 56/89  soft-tissue]
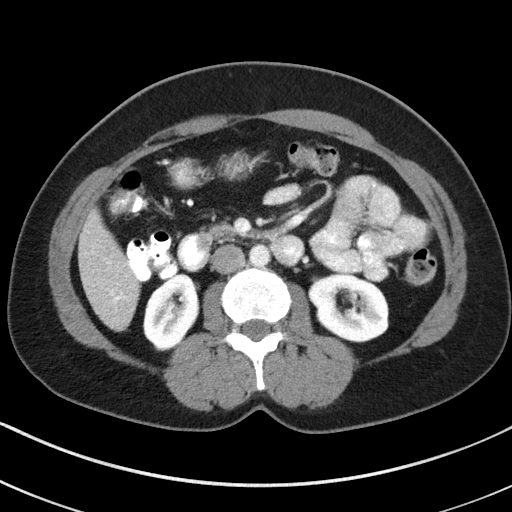
[im 56/89  bone]
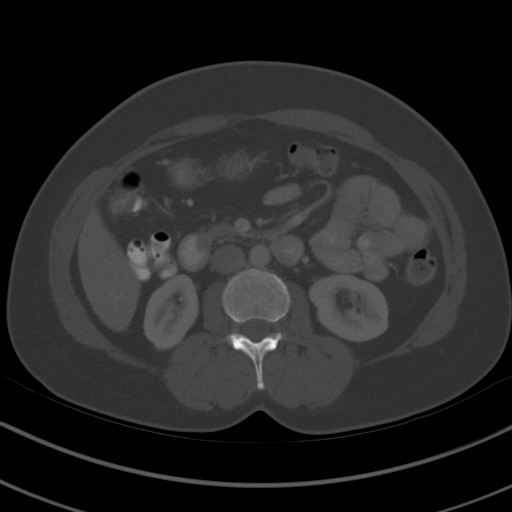
[im 65/89  soft-tissue]
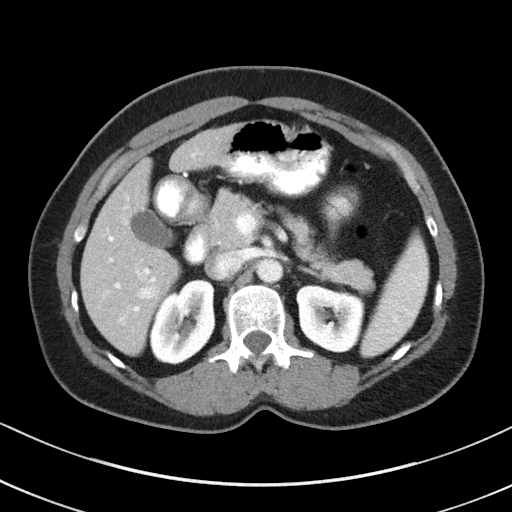
[im 70/89  soft-tissue]
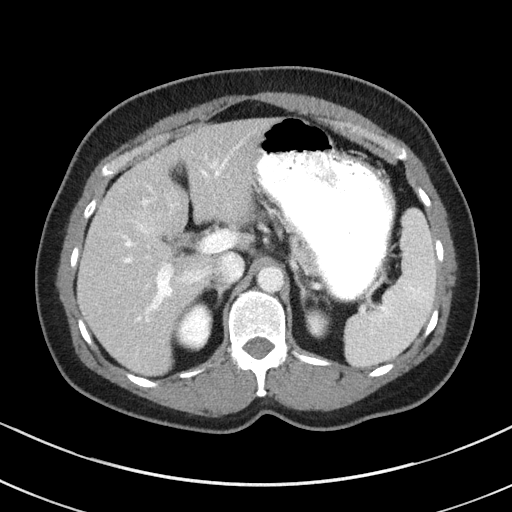
[im 70/89  lung]
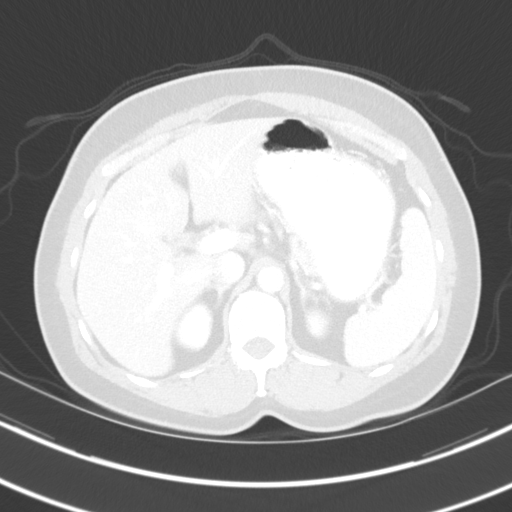
[im 75/89  soft-tissue]
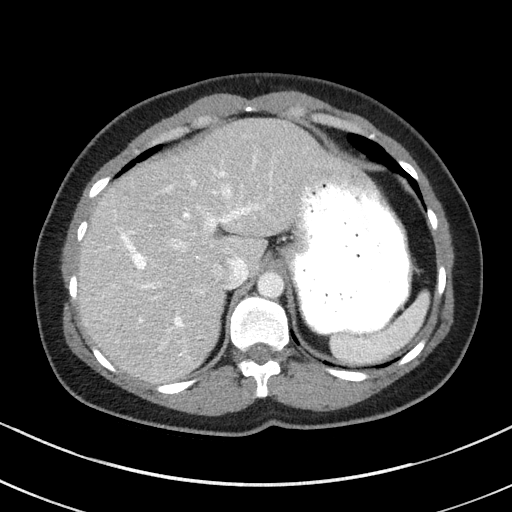
[im 75/89  lung]
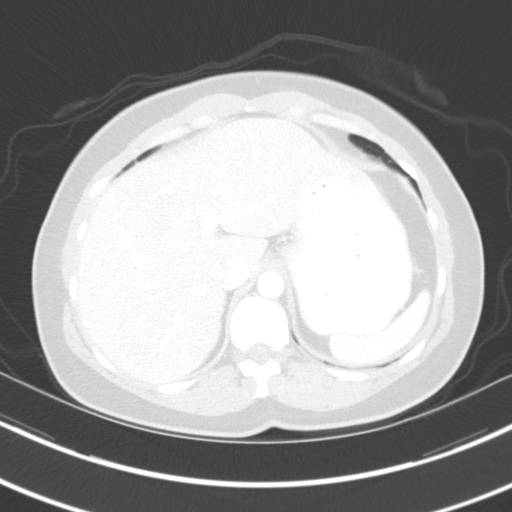
[im 79/89  lung]
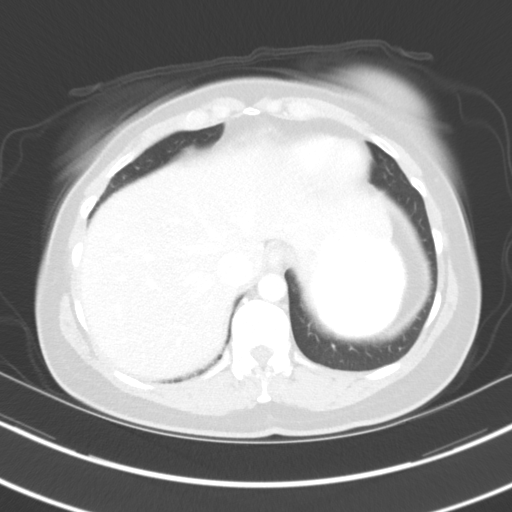
[im 84/89  soft-tissue]
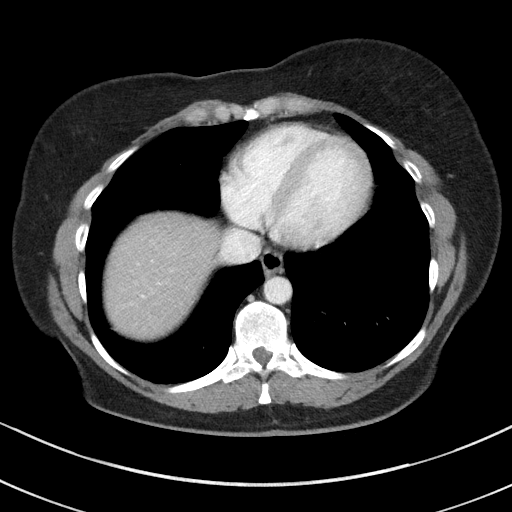
[im 84/89  lung]
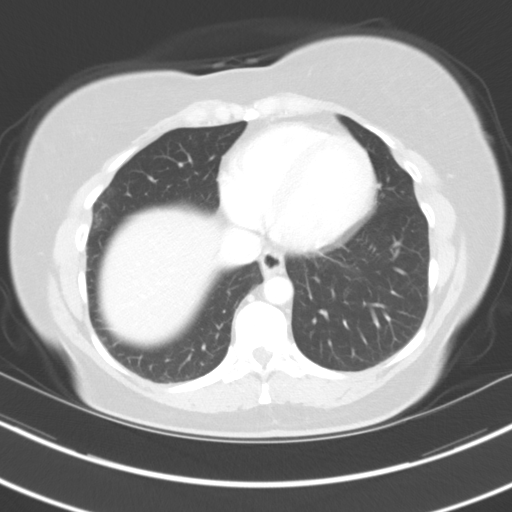

[14 of 32 positions shown; findings below may reference images not displayed]

FINDINGS: Lower chest: No acute abnormality.

Hepatobiliary: Mild fatty infiltration of the liver is noted. The
gallbladder is within normal limits.

Pancreas: Unremarkable. No pancreatic ductal dilatation or
surrounding inflammatory changes.

Spleen: Normal in size without focal abnormality.

Adrenals/Urinary Tract: Adrenal glands are within normal limits.
Kidneys demonstrate a normal enhancement pattern bilaterally. No
definitive renal calculi are seen. No obstructive changes are noted.
Normal excretion of contrast is noted bilaterally. The bladder is
partially distended.

Stomach/Bowel: Diverticular change of the colon is noted. No
obstructive or inflammatory changes are seen. The appendix is within
normal limits. Small bowel and stomach are unremarkable.

Vascular/Lymphatic: No significant vascular findings are present. No
enlarged abdominal or pelvic lymph nodes.

Reproductive: Status post hysterectomy. No adnexal masses.

Other: No abdominal wall hernia or abnormality. No abdominopelvic
ascites.

Musculoskeletal: No acute or significant osseous findings.
IMPRESSION: Chronic changes as described above.  No acute abnormality is noted.

## 2021-06-18 ENCOUNTER — Ambulatory Visit
Admission: RE | Admit: 2021-06-18 | Discharge: 2021-06-18 | Disposition: A | Discharge status: Home / Self Care | Payer: Managed Care, Other (non HMO) | Source: Ambulatory Visit | Attending: Obstetrics and Gynecology | Admitting: Obstetrics and Gynecology

## 2021-06-18 ENCOUNTER — Other Ambulatory Visit: Payer: Self-pay

## 2021-06-18 DIAGNOSIS — Z1231 Encounter for screening mammogram for malignant neoplasm of breast: Secondary | ICD-10-CM | POA: Diagnosis not present

## 2021-06-19 ENCOUNTER — Other Ambulatory Visit: Payer: Self-pay | Admitting: Obstetrics and Gynecology

## 2021-06-20 ENCOUNTER — Other Ambulatory Visit: Payer: Self-pay | Admitting: Obstetrics and Gynecology

## 2021-06-20 DIAGNOSIS — R2232 Localized swelling, mass and lump, left upper limb: Secondary | ICD-10-CM

## 2021-06-20 DIAGNOSIS — R928 Other abnormal and inconclusive findings on diagnostic imaging of breast: Secondary | ICD-10-CM

## 2021-06-25 ENCOUNTER — Ambulatory Visit
Admission: RE | Admit: 2021-06-25 | Discharge: 2021-06-25 | Disposition: A | Discharge status: Home / Self Care | Payer: Managed Care, Other (non HMO) | Source: Ambulatory Visit | Attending: Obstetrics and Gynecology | Admitting: Obstetrics and Gynecology

## 2021-06-25 ENCOUNTER — Other Ambulatory Visit: Payer: Managed Care, Other (non HMO)

## 2021-06-25 ENCOUNTER — Other Ambulatory Visit: Payer: Self-pay

## 2021-06-25 DIAGNOSIS — R928 Other abnormal and inconclusive findings on diagnostic imaging of breast: Secondary | ICD-10-CM

## 2021-06-25 DIAGNOSIS — R2232 Localized swelling, mass and lump, left upper limb: Secondary | ICD-10-CM | POA: Diagnosis present

## 2021-07-25 ENCOUNTER — Ambulatory Visit: Payer: Managed Care, Other (non HMO) | Admitting: Podiatry

## 2021-07-25 ENCOUNTER — Encounter: Payer: Self-pay | Admitting: Podiatry

## 2021-07-25 DIAGNOSIS — M25872 Other specified joint disorders, left ankle and foot: Secondary | ICD-10-CM

## 2021-07-25 DIAGNOSIS — M778 Other enthesopathies, not elsewhere classified: Secondary | ICD-10-CM

## 2021-07-25 MED ORDER — TRAMADOL HCL 50 MG PO TABS: 50.0000 mg | ORAL_TABLET | Freq: Three times a day (TID) | ORAL | 0 refills | Status: DC | PRN

## 2021-07-25 MED ORDER — TRIAMCINOLONE ACETONIDE 40 MG/ML IJ SUSP
40.0000 mg | Freq: Once | INTRAMUSCULAR | Status: AC
  Administered 2021-07-25: 40 mg

## 2021-07-25 MED ORDER — MELOXICAM 15 MG PO TABS: 15.0000 mg | ORAL_TABLET | Freq: Every day | ORAL | 3 refills | Status: DC

## 2021-07-25 MED ORDER — METHYLPREDNISOLONE 4 MG PO TBPK: ORAL_TABLET | ORAL | 0 refills | Status: DC

## 2021-07-25 NOTE — Progress Notes (Signed)
She presents today for follow-up of her capsulitis of her left foot.  States that she started to have pain beneath the second metatarsophalangeal joint area now probably from climbing a ladder and painting her building.  She states that she feels like the swelling with pins-and-needles in that area. ? ?Objective: She has pain on end range of motion of the second metatarsophalangeal joint with pain on palpation plantar aspect of the foot right.  She also has pain on palpation to the fibular sesamoid area of the left foot consistent with the old fracture and capsulitis of that area.  She is post to have surgery on that but never did. ? ?Assessment: Capsulitis right foot sesamoiditis left foot with fracture. ? ?Plan: I injected both areas today 10 mg Kenalog 5 mg Marcaine point of maximal tenderness.  Started her on methylprednisolone to be followed by meloxicam and wrote a prescription for tramadol. ?

## 2021-12-04 NOTE — H&P (Signed)
51 year old female here for treatment of vulvar and vaginal condyloma.  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 She returns with recurrence of same symptoms.  Past Medical History:  Diagnosis Date   Asthma    PONV (postoperative nausea and vomiting)    Rubella    SVT (supraventricular tachycardia) (HCC)    Past Surgical History:  Procedure Laterality Date   DIAGNOSTIC LAPAROSCOPY     left wrist surgery     lymph node removal on right side of neck     right elbow surgery     rotator cuff surgery     TONSILLECTOMY     VAGINAL HYSTERECTOMY     VULVAR LESION REMOVAL N/A 05/24/2015   Procedure: resection of VULVAR condyloma with colposcope;  Surgeon: Marcelle Overlie, MD;  Location: [facility];  Service: Gynecology;  Laterality: N/A;  need colposcope   WISDOM TOOTH EXTRACTION     Latex, Sulfa antibiotics, Amoxicillin-pot clavulanate, and Sulfonamide derivatives Scheduled Meds: Continuous Infusions: PRN Meds:. Family History  Problem Relation Age of Onset   Cancer Maternal Aunt        thyroid cancer   Cancer Maternal Uncle    Diabetes Paternal Aunt    Heart disease Neg Hx    Stroke Neg Hx    Breast cancer Neg Hx    Social History   Socioeconomic History   Marital status: Media planner    Spouse name: Not on file   Number of children: Not on file   Years of education: Not on file   Highest education level: Not on file  Occupational History   Not on file  Tobacco Use   Smoking status: Never   Smokeless tobacco: Never  Vaping Use   Vaping Use: Never used  Substance and Sexual Activity   Alcohol use: No   Drug use: No   Sexual activity: Yes  Other Topics Concern   Not on file  Social History Narrative   Not on file   Social Determinants of Health   Financial Resource Strain: Not on file  Food Insecurity: Not on file  Transportation Needs: Not on file  Physical Activity: Not on file  Stress: Not on file  Social Connections: Not on file   General  alert and oriented  Lung CTAB Car RRR  Abdomen is soft and non tender Pelvic External genitalia diffuse raised rough patches that are consistent with HPV   Vaginal similar rough raised lesions just to the inside of introitus on either side  IMPRESSION: HPV PLAN: Laser vaporization of vulva and vagina

## 2021-12-05 ENCOUNTER — Encounter (HOSPITAL_BASED_OUTPATIENT_CLINIC_OR_DEPARTMENT_OTHER): Payer: Self-pay | Admitting: Obstetrics and Gynecology

## 2021-12-05 NOTE — Progress Notes (Signed)
Spoke w/ via phone for pre-op interview--- Tasha Miller needs dos----T&S, RPR, CBC per surgeon. ISTAT Tasha Miller)               Miller results------ COVID test -----patient states asymptomatic no test needed Arrive at -------0530 NPO after MN NO Solid Food.   Med rec completed Medications to take morning of surgery ----- Bring Albuterol inhaler Diabetic medication ----- Patient instructed no nail polish to be worn day of surgery Patient instructed to bring photo id and insurance card day of surgery Patient aware to have Driver (ride ) / caregiver Wife Tasha Miller   for 24 hours after surgery  Patient Special Instructions ----- Pre-Op special Istructions ----- Patient verbalized understanding of instructions that were given at this phone interview. Patient denies shortness of breath, chest pain, fever, cough at this phone interview.

## 2021-12-18 ENCOUNTER — Ambulatory Visit (HOSPITAL_BASED_OUTPATIENT_CLINIC_OR_DEPARTMENT_OTHER): Payer: Managed Care, Other (non HMO) | Admitting: Anesthesiology

## 2021-12-18 ENCOUNTER — Encounter (HOSPITAL_BASED_OUTPATIENT_CLINIC_OR_DEPARTMENT_OTHER): Payer: Self-pay | Admitting: Obstetrics and Gynecology

## 2021-12-18 ENCOUNTER — Ambulatory Visit (HOSPITAL_BASED_OUTPATIENT_CLINIC_OR_DEPARTMENT_OTHER)
Admission: RE | Admit: 2021-12-18 | Discharge: 2021-12-18 | Disposition: A | Discharge status: Home / Self Care | Payer: Managed Care, Other (non HMO) | Source: Ambulatory Visit | Attending: Obstetrics and Gynecology | Admitting: Obstetrics and Gynecology

## 2021-12-18 ENCOUNTER — Encounter (HOSPITAL_BASED_OUTPATIENT_CLINIC_OR_DEPARTMENT_OTHER)
Admission: RE | Disposition: A | Discharge status: Home / Self Care | Payer: Self-pay | Source: Ambulatory Visit | Attending: Obstetrics and Gynecology

## 2021-12-18 ENCOUNTER — Other Ambulatory Visit: Payer: Self-pay

## 2021-12-18 DIAGNOSIS — A630 Anogenital (venereal) warts: Secondary | ICD-10-CM | POA: Diagnosis present

## 2021-12-18 DIAGNOSIS — Z01818 Encounter for other preprocedural examination: Secondary | ICD-10-CM

## 2021-12-18 HISTORY — PX: CONDYLOMA EXCISION/FULGURATION: SHX1389

## 2021-12-18 HISTORY — DX: Personal history of urinary calculi: Z87.442

## 2021-12-18 LAB — CBC
HCT: 46.2 % — ABNORMAL HIGH (ref 36.0–46.0)
Hemoglobin: 15.4 g/dL — ABNORMAL HIGH (ref 12.0–15.0)
MCH: 28.9 pg (ref 26.0–34.0)
MCHC: 33.3 g/dL (ref 30.0–36.0)
MCV: 86.8 fL (ref 80.0–100.0)
Platelets: 231 10*3/uL (ref 150–400)
RBC: 5.32 MIL/uL — ABNORMAL HIGH (ref 3.87–5.11)
RDW: 12.9 % (ref 11.5–15.5)
WBC: 9.6 10*3/uL (ref 4.0–10.5)
nRBC: 0 % (ref 0.0–0.2)

## 2021-12-18 LAB — POCT I-STAT, CHEM 8
BUN: 19 mg/dL (ref 6–20)
Calcium, Ion: 1.15 mmol/L (ref 1.15–1.40)
Chloride: 108 mmol/L (ref 98–111)
Creatinine, Ser: 0.7 mg/dL (ref 0.44–1.00)
Glucose, Bld: 101 mg/dL — ABNORMAL HIGH (ref 70–99)
HCT: 43 % (ref 36.0–46.0)
Hemoglobin: 14.6 g/dL (ref 12.0–15.0)
Potassium: 4.1 mmol/L (ref 3.5–5.1)
Sodium: 143 mmol/L (ref 135–145)
TCO2: 22 mmol/L (ref 22–32)

## 2021-12-18 LAB — TYPE AND SCREEN
ABO/RH(D): B POS
Antibody Screen: NEGATIVE

## 2021-12-18 LAB — ABO/RH: ABO/RH(D): B POS

## 2021-12-18 SURGERY — REMOVAL, CONDYLOMA
Anesthesia: General | Site: Vulva

## 2021-12-18 MED ORDER — PROMETHAZINE HCL 25 MG/ML IJ SOLN: 6.2500 mg | INTRAMUSCULAR | Status: DC | PRN

## 2021-12-18 MED ORDER — FENTANYL CITRATE (PF) 100 MCG/2ML IJ SOLN
INTRAMUSCULAR | Status: DC | PRN
  Administered 2021-12-18 (×2): 50 ug via INTRAVENOUS

## 2021-12-18 MED ORDER — OXYCODONE HCL 5 MG PO TABS: 5.0000 mg | ORAL_TABLET | ORAL | 0 refills | Status: DC | PRN

## 2021-12-18 MED ORDER — PHENYLEPHRINE 80 MCG/ML (10ML) SYRINGE FOR IV PUSH (FOR BLOOD PRESSURE SUPPORT)
PREFILLED_SYRINGE | INTRAVENOUS | Status: DC | PRN
  Administered 2021-12-18: 80 ug via INTRAVENOUS

## 2021-12-18 MED ORDER — DEXAMETHASONE SODIUM PHOSPHATE 10 MG/ML IJ SOLN
INTRAMUSCULAR | Status: AC
  Filled 2021-12-18: qty 1

## 2021-12-18 MED ORDER — POVIDONE-IODINE 10 % EX SWAB: 2.0000 | Freq: Once | CUTANEOUS | Status: DC

## 2021-12-18 MED ORDER — LIDOCAINE 2% (20 MG/ML) 5 ML SYRINGE
INTRAMUSCULAR | Status: DC | PRN
  Administered 2021-12-18: 70 mg via INTRAVENOUS

## 2021-12-18 MED ORDER — METRONIDAZOLE 500 MG/100ML IV SOLN
500.0000 mg | INTRAVENOUS | Status: AC
  Administered 2021-12-18: 500 mg via INTRAVENOUS

## 2021-12-18 MED ORDER — TRIPLE ANTIBIOTIC 3.5-400-5000 EX OINT
TOPICAL_OINTMENT | CUTANEOUS | Status: DC | PRN
  Administered 2021-12-18: 1 via VAGINAL

## 2021-12-18 MED ORDER — FENTANYL CITRATE (PF) 100 MCG/2ML IJ SOLN
INTRAMUSCULAR | Status: AC
  Filled 2021-12-18: qty 2

## 2021-12-18 MED ORDER — SCOPOLAMINE 1 MG/3DAYS TD PT72
MEDICATED_PATCH | TRANSDERMAL | Status: AC
  Filled 2021-12-18: qty 1

## 2021-12-18 MED ORDER — KETOROLAC TROMETHAMINE 30 MG/ML IJ SOLN
INTRAMUSCULAR | Status: DC | PRN
  Administered 2021-12-18: 30 mg via INTRAVENOUS

## 2021-12-18 MED ORDER — KETOROLAC TROMETHAMINE 30 MG/ML IJ SOLN
INTRAMUSCULAR | Status: AC
  Filled 2021-12-18: qty 1

## 2021-12-18 MED ORDER — AMISULPRIDE (ANTIEMETIC) 5 MG/2ML IV SOLN: 10.0000 mg | Freq: Once | INTRAVENOUS | Status: DC | PRN

## 2021-12-18 MED ORDER — ONDANSETRON HCL 4 MG/2ML IJ SOLN
INTRAMUSCULAR | Status: DC | PRN
  Administered 2021-12-18: 4 mg via INTRAVENOUS

## 2021-12-18 MED ORDER — DEXAMETHASONE SODIUM PHOSPHATE 10 MG/ML IJ SOLN
INTRAMUSCULAR | Status: DC | PRN
  Administered 2021-12-18: 10 mg via INTRAVENOUS

## 2021-12-18 MED ORDER — PROPOFOL 10 MG/ML IV BOLUS
INTRAVENOUS | Status: AC
  Filled 2021-12-18: qty 20

## 2021-12-18 MED ORDER — MEPERIDINE HCL 25 MG/ML IJ SOLN: 6.2500 mg | INTRAMUSCULAR | Status: DC | PRN

## 2021-12-18 MED ORDER — SCOPOLAMINE 1 MG/3DAYS TD PT72
1.0000 | MEDICATED_PATCH | TRANSDERMAL | Status: DC
  Administered 2021-12-18: 1.5 mg via TRANSDERMAL

## 2021-12-18 MED ORDER — LACTATED RINGERS IV SOLN: INTRAVENOUS | Status: DC

## 2021-12-18 MED ORDER — GENTAMICIN SULFATE 40 MG/ML IJ SOLN
5.0000 mg/kg | INTRAVENOUS | Status: AC
  Administered 2021-12-18: 330 mg via INTRAVENOUS
  Filled 2021-12-18: qty 8.25

## 2021-12-18 MED ORDER — OXYCODONE HCL 5 MG PO TABS
5.0000 mg | ORAL_TABLET | Freq: Once | ORAL | Status: AC | PRN
  Administered 2021-12-18: 5 mg via ORAL

## 2021-12-18 MED ORDER — LIDOCAINE 5 % EX OINT: 1.0000 | TOPICAL_OINTMENT | CUTANEOUS | 0 refills | Status: DC | PRN

## 2021-12-18 MED ORDER — ONDANSETRON HCL 4 MG/2ML IJ SOLN
INTRAMUSCULAR | Status: AC
  Filled 2021-12-18: qty 2

## 2021-12-18 MED ORDER — MIDAZOLAM HCL 2 MG/2ML IJ SOLN
INTRAMUSCULAR | Status: DC | PRN
  Administered 2021-12-18: 2 mg via INTRAVENOUS

## 2021-12-18 MED ORDER — STERILE WATER FOR IRRIGATION IR SOLN
Status: DC | PRN
  Administered 2021-12-18: 1000 mL

## 2021-12-18 MED ORDER — MIDAZOLAM HCL 2 MG/2ML IJ SOLN
INTRAMUSCULAR | Status: AC
  Filled 2021-12-18: qty 2

## 2021-12-18 MED ORDER — ACETAMINOPHEN 500 MG PO TABS
1000.0000 mg | ORAL_TABLET | Freq: Once | ORAL | Status: AC
  Administered 2021-12-18: 1000 mg via ORAL

## 2021-12-18 MED ORDER — ACETAMINOPHEN 500 MG PO TABS
ORAL_TABLET | ORAL | Status: AC
  Filled 2021-12-18: qty 2

## 2021-12-18 MED ORDER — METRONIDAZOLE 500 MG/100ML IV SOLN
INTRAVENOUS | Status: AC
  Filled 2021-12-18: qty 100

## 2021-12-18 MED ORDER — PROPOFOL 10 MG/ML IV BOLUS
INTRAVENOUS | Status: DC | PRN
  Administered 2021-12-18: 200 mg via INTRAVENOUS

## 2021-12-18 MED ORDER — OXYCODONE HCL 5 MG PO TABS
ORAL_TABLET | ORAL | Status: AC
  Filled 2021-12-18: qty 1

## 2021-12-18 MED ORDER — OXYCODONE HCL 5 MG/5ML PO SOLN: 5.0000 mg | Freq: Once | ORAL | Status: AC | PRN

## 2021-12-18 MED ORDER — LIDOCAINE HCL (PF) 2 % IJ SOLN
INTRAMUSCULAR | Status: AC
  Filled 2021-12-18: qty 5

## 2021-12-18 MED ORDER — FENTANYL CITRATE (PF) 100 MCG/2ML IJ SOLN
25.0000 ug | INTRAMUSCULAR | Status: DC | PRN
  Administered 2021-12-18 (×2): 50 ug via INTRAVENOUS

## 2021-12-18 SURGICAL SUPPLY — 15 items
BLADE SURG 15 STRL LF DISP TIS (BLADE) ×1 IMPLANT
BLADE SURG 15 STRL SS (BLADE) ×1
GLOVE BIOGEL PI IND STRL 7.0 (GLOVE) IMPLANT
GLOVE BIOGEL PI INDICATOR 7.0 (GLOVE) ×3
GLOVE SURG SS PI 6.5 STRL IVOR (GLOVE) IMPLANT
GOWN STRL REUS W/TWL LRG LVL3 (GOWN DISPOSABLE) ×1 IMPLANT
HIBICLENS CHG 4% 4OZ BTL (MISCELLANEOUS) IMPLANT
KIT TURNOVER CYSTO (KITS) ×1 IMPLANT
NDL HYPO 25X1 1.5 SAFETY (NEEDLE) ×1 IMPLANT
NEEDLE HYPO 25X1 1.5 SAFETY (NEEDLE) ×1 IMPLANT
PACK PERINEAL COLD (PAD) IMPLANT
PACK VAGINAL WOMENS (CUSTOM PROCEDURE TRAY) ×1 IMPLANT
TOWEL OR 17X26 10 PK STRL BLUE (TOWEL DISPOSABLE) ×1 IMPLANT
VACUUM HOSE 7/8X10 W/ WAND (MISCELLANEOUS) IMPLANT
WATER STERILE IRR 1000ML POUR (IV SOLUTION) IMPLANT

## 2021-12-18 NOTE — Anesthesia Preprocedure Evaluation (Signed)
Anesthesia Evaluation    Reviewed: Allergy & Precautions, H&P , Patient's Chart, lab work & pertinent test results, Unable to perform ROS - Chart review only  History of Anesthesia Complications (+) PONV and history of anesthetic complications  Airway Mallampati: I  TM Distance: >3 FB Neck ROM: full    Dental no notable dental hx. (+) Dental Advisory Given, Teeth Intact   Pulmonary asthma ,    Pulmonary exam normal breath sounds clear to auscultation       Cardiovascular negative cardio ROS Normal cardiovascular exam Rhythm:regular Rate:Normal     Neuro/Psych PSYCHIATRIC DISORDERS Bipolar Disorder negative neurological ROS     GI/Hepatic negative GI ROS, Neg liver ROS,   Endo/Other  negative endocrine ROS  Renal/GU negative Renal ROS     Musculoskeletal   Abdominal   Peds  Hematology negative hematology ROS (+)   Anesthesia Other Findings   Reproductive/Obstetrics negative OB ROS                             Anesthesia Physical  Anesthesia Plan  ASA: 2  Anesthesia Plan: General   Post-op Pain Management: Tylenol PO (pre-op)* and Toradol IV (intra-op)*   Induction: Intravenous  PONV Risk Score and Plan: 4 or greater and Ondansetron, Dexamethasone, Treatment may vary due to age or medical condition, Midazolam, Scopolamine patch - Pre-op, Propofol infusion and TIVA  Airway Management Planned: LMA  Additional Equipment:   Intra-op Plan:   Post-operative Plan: Extubation in OR  Informed Consent:   Plan Discussed with:   Anesthesia Plan Comments: ( )        Anesthesia Quick Evaluation

## 2021-12-18 NOTE — Progress Notes (Signed)
H and P on the chart We reviewed the areas to be treated today  BP (!) 143/88   Pulse 71   Temp 97.7 F (36.5 C) (Oral)   Resp 17   Ht 5\' 4"  (1.626 m)   Wt 64 kg   SpO2 97%   BMI 24.24 kg/m  Results for orders placed or performed during the hospital encounter of 12/18/21 (from the past 24 hour(s))  CBC     Status: Abnormal   Collection Time: 12/18/21  5:39 AM  Result Value Ref Range   WBC 9.6 4.0 - 10.5 K/uL   RBC 5.32 (H) 3.87 - 5.11 MIL/uL   Hemoglobin 15.4 (H) 12.0 - 15.0 g/dL   HCT 12/20/21 (H) 13.2 - 44.0 %   MCV 86.8 80.0 - 100.0 fL   MCH 28.9 26.0 - 34.0 pg   MCHC 33.3 30.0 - 36.0 g/dL   RDW 10.2 72.5 - 36.6 %   Platelets 231 150 - 400 K/uL   nRBC 0.0 0.0 - 0.2 %  I-STAT, chem 8     Status: Abnormal   Collection Time: 12/18/21  6:57 AM  Result Value Ref Range   Sodium 143 135 - 145 mmol/L   Potassium 4.1 3.5 - 5.1 mmol/L   Chloride 108 98 - 111 mmol/L   BUN 19 6 - 20 mg/dL   Creatinine, Ser 12/20/21 0.44 - 1.00 mg/dL   Glucose, Bld 3.47 (H) 70 - 99 mg/dL   Calcium, Ion 425 9.56 - 1.40 mmol/L   TCO2 22 22 - 32 mmol/L   Hemoglobin 14.6 12.0 - 15.0 g/dL   HCT 3.87 56.4 - 33.2 %   Vulvar and vaginal condyloma Laser vaporization of condyloma Post op instructions given to patient All questions are answered

## 2021-12-18 NOTE — Op Note (Unsigned)
Tasha Miller, Tasha Miller MEDICAL RECORD NO: [id number] ACCOUNT NO: [id number] DATE OF BIRTH: 07-18-1970 FACILITY: [facility] LOCATION: [facility] PHYSICIAN: Leilanni Halvorson L. Helane Rima, MD  Operative Report   DATE OF PROCEDURE: 12/18/2021  PREOPERATIVE DIAGNOSIS:  Vaginal and vulvar condyloma.  POSTOPERATIVE DIAGNOSIS:  Vaginal and vulvar condyloma.  PROCEDURE:  Laser vaporization of vaginal and vulvar condyloma.  SURGEON:  Hue Steveson L. Helane Rima, MD  ANESTHESIA:  MAC.  ESTIMATED BLOOD LOSS:  None.  COMPLICATIONS:  None.  PATHOLOGY:  None.  INDICATION FOR PROCEDURE:  This patient has a history of HPV/condyloma prior to the surgery. On the morning of surgery, she and I extensively reviewed the areas of concern and we agreed to treat those areas.  DESCRIPTION OF PROCEDURE:  After informed consent was obtained, she was then taken to the operating room.  She was administered anesthesia.  She was then prepped with Hibiclens solution.  A timeout had been performed.  The patient was appropriately  draped for laser procedure.  Using the laser on the setting 8, I then initially started with an exam under anesthesia and could note the areas of concern were consistent with some small rough raised patches.  The first area that I treated was in the  anterior vaginal wall on the right and the left in the midline approximately 2 cm from the introitus.  Those areas were thoroughly treated with a laser. The second region that I treated was the posterior vaginal wall just on the inner aspect of the  hymenal ring at the 5 and 7 o'clock that was consistent with some small raised patches, which those areas were treated with the laser.  Once I felt like a complete treatment of the vagina was performed, we then inspected the vulva.  The areas of concern  for the patient was over the clitoral hood and on the inner fold of the clitoris just on either side of the clitoris and all the way down the inner aspect between the labia  minora, labia majora on the patient's left.  Those areas did look consistent with  small, red, kind of raised areas, and those areas were all completely treated with the laser.  At the end of the procedure, I felt I had treated all the areas that she and I had discussed as well as the areas that I noticed.  Neosporin ointment was  applied to the area internally and externally.  She was then taken to recovery after she was awake and all sponge, lap and instrument counts were correct x2.  DISPOSITION:  The patient was discharged home with strict postoperative precautions.   NIK D: 12/18/2021 8:01:12 am T: 12/18/2021 10:37:00 am  JOB: [id number]/ [id number]

## 2021-12-18 NOTE — Anesthesia Procedure Notes (Signed)
Procedure Name: LMA Insertion Date/Time: 12/18/2021 7:32 AM  Performed by: Norva Pavlov, CRNAPre-anesthesia Checklist: Patient identified, Emergency Drugs available, Suction available and Patient being monitored Patient Re-evaluated:Patient Re-evaluated prior to induction Oxygen Delivery Method: Circle system utilized Preoxygenation: Pre-oxygenation with 100% oxygen Induction Type: IV induction Ventilation: Mask ventilation without difficulty LMA: LMA inserted LMA Size: 4.0 Number of attempts: 1 Airway Equipment and Method: Bite block Placement Confirmation: positive ETCO2 Tube secured with: Tape Dental Injury: Teeth and Oropharynx as per pre-operative assessment

## 2021-12-18 NOTE — Anesthesia Postprocedure Evaluation (Signed)
Anesthesia Post Note  Patient: Tasha Miller  Procedure(s) Performed: LASER VAPORIZATION OF VULVAR/VAGINA (Vulva)     Patient location during evaluation: PACU Anesthesia Type: General Level of consciousness: sedated and patient cooperative Pain management: pain level controlled Vital Signs Assessment: post-procedure vital signs reviewed and stable Respiratory status: spontaneous breathing Cardiovascular status: stable Anesthetic complications: no   No notable events documented.  Last Vitals:  Vitals:   12/18/21 0845 12/18/21 0905  BP: (!) 110/55 114/84  Pulse: 70 (!) 51  Resp: 16 16  Temp:  (!) 36.3 C  SpO2: 96% 98%    Last Pain:  Vitals:   12/18/21 0928  TempSrc:   PainSc: 3                  Lewie Loron

## 2021-12-18 NOTE — Discharge Instructions (Addendum)
  Post Anesthesia Home Care Instructions  Activity: Get plenty of rest for the remainder of the day. A responsible individual must stay with you for 24 hours following the procedure.  For the next 24 hours, DO NOT: -Drive a car -Advertising copywriter -Drink alcoholic beverages -Take any medication unless instructed by your physician -Make any legal decisions or sign important papers.  Meals: Start with liquid foods such as gelatin or soup. Progress to regular foods as tolerated. Avoid greasy, spicy, heavy foods. If nausea and/or vomiting occur, drink only clear liquids until the nausea and/or vomiting subsides. Call your physician if vomiting continues.  Special Instructions/Symptoms: Your throat may feel dry or sore from the anesthesia or the breathing tube placed in your throat during surgery. If this causes discomfort, gargle with warm salt water. The discomfort should disappear within 24 hours.  If you had a scopolamine patch placed behind your ear for the management of post- operative nausea and/or vomiting:  1. The medication in the patch is effective for 72 hours, after which it should be removed.  Wrap patch in a tissue and discard in the trash. Wash hands thoroughly with soap and water. 2. You may remove the patch earlier than 72 hours if you experience unpleasant side effects which may include dry mouth, dizziness or visual disturbances. 3. Avoid touching the patch. Wash your hands with soap and water after contact with the patch.       Remove patch behind left ear by Friday, December 21, 2021. May take Tylenol beginning at 1 PM as needed for pain/discomfort. May take Ibuprofen beginning at 2 PM as needed for pain/discomfort.  Call your surgeon if you experience:   1.  Fever over 101.0. 2.  Inability to urinate. 3.  Nausea and/or vomiting. 4.  Extreme swelling or bruising at the surgical site. 5.  Continued bleeding from the incision. 6.  Increased pain, redness or drainage  from the incision. 7.  Problems related to your pain medication. 8.  Any problems and/or concerns  9. Ice pack as needed. 10. May shower beginning December 20, 2021 11.Pads only as needed for drainage.

## 2021-12-18 NOTE — Brief Op Note (Signed)
12/18/2021  7:54 AM  PATIENT:  Tasha Miller  51 y.o. female  PRE-OPERATIVE DIAGNOSIS:   Vaginal and vulvar condyloma  POST-OPERATIVE DIAGNOSIS: Same  PROCEDURE:  Procedure(s): LASER VAPORIZATION OF VULVAR/VAGINA (N/A)  SURGEON:  Surgeon(s) and Role:    * Dian Queen, MD - Primary  PHYSICIAN ASSISTANT:   ASSISTANTS: none   ANESTHESIA:   MAC  EBL:  none   BLOOD ADMINISTERED:none  DRAINS: none   LOCAL MEDICATIONS USED:  NONE  SPECIMEN:  No Specimen  DISPOSITION OF SPECIMEN:  N/A  COUNTS:  YES  TOURNIQUET:  * No tourniquets in log *  DICTATION: .Other Dictation: Dictation Number done  PLAN OF CARE: Discharge to home after PACU  PATIENT DISPOSITION:  PACU - hemodynamically stable.   Delay start of Pharmacological VTE agent (>24hrs) due to surgical blood loss or risk of bleeding: not applicable

## 2021-12-18 NOTE — Transfer of Care (Signed)
Immediate Anesthesia Transfer of Care Note  Patient: Tasha Miller  Procedure(s) Performed: Procedure(s) (LRB): LASER VAPORIZATION OF VULVAR/VAGINA (N/A)  Patient Location: PACU  Anesthesia Type: General  Level of Consciousness: awake, alert  and oriented  Airway & Oxygen Therapy: Patient Spontanous Breathing and Patient connected to nasal cannula oxygen  Post-op Assessment: Report given to PACU RN and Post -op Vital signs reviewed and stable  Post vital signs: Reviewed and stable  Complications: No apparent anesthesia complications  Last Vitals:  Vitals Value Taken Time  BP 126/77 12/18/21 0811  Temp 36.3 C 12/18/21 0812  Pulse 95 12/18/21 0813  Resp 18 12/18/21 0813  SpO2 92 % 12/18/21 0813  Vitals shown include unvalidated device data.  Last Pain:  Vitals:   12/18/21 0602  TempSrc: Oral  PainSc: 0-No pain      Patients Stated Pain Goal: 4 (12/18/21 0602)  Complications: No notable events documented.

## 2021-12-19 ENCOUNTER — Encounter (HOSPITAL_BASED_OUTPATIENT_CLINIC_OR_DEPARTMENT_OTHER): Payer: Self-pay | Admitting: Obstetrics and Gynecology

## 2022-03-28 ENCOUNTER — Other Ambulatory Visit: Payer: Self-pay | Admitting: Podiatry

## 2022-06-07 ENCOUNTER — Encounter: Payer: Self-pay | Admitting: Internal Medicine

## 2022-06-07 NOTE — Telephone Encounter (Signed)
Yes, she needs to be seen. I haven't seen her in over a year

## 2022-06-11 ENCOUNTER — Telehealth (INDEPENDENT_AMBULATORY_CARE_PROVIDER_SITE_OTHER): Payer: Managed Care, Other (non HMO) | Admitting: Internal Medicine

## 2022-06-11 ENCOUNTER — Encounter: Payer: Self-pay | Admitting: Internal Medicine

## 2022-06-11 DIAGNOSIS — M79671 Pain in right foot: Secondary | ICD-10-CM | POA: Diagnosis not present

## 2022-06-11 DIAGNOSIS — J4520 Mild intermittent asthma, uncomplicated: Secondary | ICD-10-CM

## 2022-06-11 DIAGNOSIS — F3131 Bipolar disorder, current episode depressed, mild: Secondary | ICD-10-CM | POA: Diagnosis not present

## 2022-06-11 DIAGNOSIS — M79672 Pain in left foot: Secondary | ICD-10-CM

## 2022-06-11 DIAGNOSIS — E781 Pure hyperglyceridemia: Secondary | ICD-10-CM | POA: Diagnosis not present

## 2022-06-11 DIAGNOSIS — G8929 Other chronic pain: Secondary | ICD-10-CM

## 2022-06-11 MED ORDER — ARIPIPRAZOLE 2 MG PO TABS: 2.0000 mg | ORAL_TABLET | Freq: Every day | ORAL | 2 refills | Status: DC

## 2022-06-11 MED ORDER — TRAMADOL HCL 50 MG PO TABS: 50.0000 mg | ORAL_TABLET | Freq: Every day | ORAL | 0 refills | Status: DC | PRN

## 2022-06-11 NOTE — Assessment & Plan Note (Signed)
Meloxicam and tramadol refilled Referral to podiatry placed at [facility] for second opinion

## 2022-06-11 NOTE — Assessment & Plan Note (Signed)
Continue albuterol as needed 

## 2022-06-11 NOTE — Assessment & Plan Note (Signed)
Encouraged her to consume low-fat diet

## 2022-06-11 NOTE — Progress Notes (Signed)
Virtual Visit via Video Note  I connected with Tasha Miller on 06/11/22 at 11:20 AM EST by a video enabled telemedicine application and verified that I am speaking with the correct person using two identifiers.  Location: Patient: Work Provider: Office  Persons participating in this video call: Webb Silversmith, NP and Semia Kappel   I discussed the limitations of evaluation and management by telemedicine and the availability of in person appointments. The patient expressed understanding and agreed to proceed.  History of Present Illness:  Patient presents to clinic today for follow-up of chronic conditions  Bipolar Depression: Chronic, worse since she retired.  She was recently started on Sertraline but is unable to take this medication secondary to side effects.  She would like to be restarted on Lamotrigine.  She is not currently seeing a therapist or psychiatrist.  She denies anxiety, SI/HI.  Allergy Induced Asthma: She denies chronic cough or shortness of breath.  She has not used her Albuterol recently.  There are no PFTs on file.  She does not follow with pulmonology.  Chronic Foot Pain: Bilateral.  Managed with Meloxicam and Tramadol.  She would like a refill of this today.  She would like a referral to podiatry for second opinion as well.  Hypertriglyceridemia: Her last triglycerides were 227, LDL not calculated.  She is not taking any cholesterol-lowering medication at this time.  She tries to consume low-fat diet.  Past Medical History:  Diagnosis Date   Asthma    History of kidney stones    PONV (postoperative nausea and vomiting)    Rubella    SVT (supraventricular tachycardia) (HCC)     Current Outpatient Medications  Medication Sig Dispense Refill   albuterol (VENTOLIN HFA) 108 (90 Base) MCG/ACT inhaler Inhale 2 puffs into the lungs as needed for wheezing or shortness of breath. 1 each 2   cyanocobalamin (,VITAMIN B-12,) 1000 MCG/ML injection Inject 1,000 mcg into the  muscle every 14 (fourteen) days.     meloxicam (MOBIC) 15 MG tablet TAKE 1 TABLET (15 MG TOTAL) BY MOUTH DAILY. 30 tablet 3   progesterone (PROMETRIUM) 100 MG capsule Take 100 mg by mouth at bedtime.     Testosterone 200 MG PLLT by Implant route.     traMADol (ULTRAM) 50 MG tablet Take 1 tablet (50 mg total) by mouth every 8 (eight) hours as needed. 30 tablet 0   Vitamin D, Ergocalciferol, (DRISDOL) 1.25 MG (50000 UT) CAPS capsule TAKE 1 CAPSULE BY MOUTH WEEKLY     No current facility-administered medications for this visit.    Allergies  Allergen Reactions   Latex Itching and Swelling   Sulfa Antibiotics Itching and Nausea And Vomiting   Amoxicillin-Pot Clavulanate Nausea And Vomiting   Sulfonamide Derivatives Nausea And Vomiting    Family History  Problem Relation Age of Onset   Cancer Maternal Aunt        thyroid cancer   Cancer Maternal Uncle    Diabetes Paternal Aunt    Heart disease Neg Hx    Stroke Neg Hx    Breast cancer Neg Hx     Social History   Socioeconomic History   Marital status: Soil scientist    Spouse name: Not on file   Number of children: Not on file   Years of education: Not on file   Highest education level: Not on file  Occupational History   Not on file  Tobacco Use   Smoking status: Never   Smokeless tobacco: Never  Vaping Use   Vaping Use: Never used  Substance and Sexual Activity   Alcohol use: No   Drug use: No   Sexual activity: Yes  Other Topics Concern   Not on file  Social History Narrative   Not on file   Social Determinants of Health   Financial Resource Strain: Not on file  Food Insecurity: Not on file  Transportation Needs: Not on file  Physical Activity: Not on file  Stress: Not on file  Social Connections: Not on file  Intimate Partner Violence: Not on file     Constitutional: Denies fever, malaise, fatigue, headache or abrupt weight changes.  HEENT: Denies eye pain, eye redness, ear pain, ringing in the ears,  wax buildup, runny nose, nasal congestion, bloody nose, or sore throat. Respiratory: Denies difficulty breathing, shortness of breath, cough or sputum production.   Cardiovascular: Denies chest pain, chest tightness, palpitations or swelling in the hands or feet.  Gastrointestinal: Denies abdominal pain, bloating, constipation, diarrhea or blood in the stool.  GU: Denies urgency, frequency, pain with urination, burning sensation, blood in urine, odor or discharge. Musculoskeletal: Patient reports chronic foot pain.  Denies decrease in range of motion, difficulty with gait, muscle pain or joint swelling.  Skin: Denies redness, rashes, lesions or ulcercations.  Neurological: Denies dizziness, difficulty with memory, difficulty with speech or problems with balance and coordination.  Psych: Patient has a history of depression.  Denies anxiety, SI/HI.  No other specific complaints in a complete review of systems (except as listed in HPI above).   Observations/Objective:   Wt Readings from Last 3 Encounters:  12/18/21 141 lb 3.2 oz (64 kg)  03/20/21 138 lb 12.8 oz (63 kg)  02/01/21 142 lb (64.4 kg)    General: Appears her stated age, well developed, well nourished in NAD. Pulmonary/Chest: Normal effort. No respiratory distress. No wheezes, rales or ronchi noted.  Neurological: Alert and oriented. Coordination normal.  Psychiatric: Mood and affect mildly flat. Behavior is normal. Judgment and thought content normal.     BMET    Component Value Date/Time   NA 143 12/18/2021 0657   NA 140 02/28/2016 0900   NA 144 07/10/2011 1025   K 4.1 12/18/2021 0657   K 3.6 07/10/2011 1025   CL 108 12/18/2021 0657   CL 108 (H) 07/10/2011 1025   CO2 30 05/17/2020 1059   CO2 24 07/10/2011 1025   GLUCOSE 101 (H) 12/18/2021 0657   GLUCOSE 142 (H) 07/10/2011 1025   BUN 19 12/18/2021 0657   BUN 10 02/28/2016 0900   BUN 10 07/10/2011 1025   CREATININE 0.70 12/18/2021 0657   CREATININE 1.00  06/26/2018 1521   CALCIUM 9.3 05/17/2020 1059   CALCIUM 8.7 07/10/2011 1025   GFRNONAA >60 12/01/2018 2325   GFRNONAA >60 07/10/2011 1025   GFRAA >60 12/01/2018 2325   GFRAA >60 07/10/2011 1025    Lipid Panel     Component Value Date/Time   CHOL 217 (H) 01/18/2020 1542   CHOL 209 (H) 10/16/2017 1438   TRIG 227.0 (H) 01/18/2020 1542   HDL 43.50 01/18/2020 1542   HDL 42 10/16/2017 1438   CHOLHDL 5 01/18/2020 1542   VLDL 45.4 (H) 01/18/2020 1542   [place]  06/26/2018 1521     Comment:     . LDL cholesterol not calculated. Triglyceride levels greater than 400 mg/dL invalidate calculated LDL results. . Reference range: <100 . Desirable range <100 mg/dL for primary prevention;   <70 mg/dL for patients with  CHD or diabetic patients  with > or = 2 CHD risk factors. Marland Kitchen LDL-C is now calculated using the Martin-Hopkins  calculation, which is a validated novel method providing  better accuracy than the Friedewald equation in the  estimation of LDL-C.  Cresenciano Genre et al. Annamaria Helling. [id number]): 2061-2068  (http://education.[facility].com/faq/FAQ164)     CBC    Component Value Date/Time   WBC 9.6 12/18/2021 0539   RBC 5.32 (H) 12/18/2021 0539   HGB 14.6 12/18/2021 0657   HGB 16.2 (H) 12/20/2020 1105   HCT 43.0 12/18/2021 0657   HCT 47.4 (H) 12/20/2020 1105   PLT 231 12/18/2021 0539   PLT 209 12/20/2020 1105   MCV 86.8 12/18/2021 0539   MCV 85 12/20/2020 1105   MCV 88 07/10/2011 1025   MCH 28.9 12/18/2021 0539   MCHC 33.3 12/18/2021 0539   RDW 12.9 12/18/2021 0539   RDW 12.3 12/20/2020 1105   RDW 12.6 07/10/2011 1025   LYMPHSABS 2.5 12/20/2020 1105   MONOABS 0.8 12/01/2018 2325   EOSABS 0.3 12/20/2020 1105   BASOSABS 0.1 12/20/2020 1105    Hgb A1C Lab Results  Component Value Date   HGBA1C 5.9 01/18/2020       Assessment and Plan:  RTC in 6 months for your annual exam Follow Up Instructions:    I discussed the assessment and treatment plan with the  patient. The patient was provided an opportunity to ask questions and all were answered. The patient agreed with the plan and demonstrated an understanding of the instructions.   The patient was advised to call back or seek an in-person evaluation if the symptoms worsen or if the condition fails to improve as anticipated.   Webb Silversmith, NP

## 2022-06-11 NOTE — Assessment & Plan Note (Signed)
Will try Abilify 2 mg daily Support offered

## 2022-06-11 NOTE — Patient Instructions (Signed)

## 2022-07-04 ENCOUNTER — Other Ambulatory Visit: Payer: Self-pay | Admitting: Internal Medicine

## 2022-07-04 NOTE — Telephone Encounter (Signed)
Requested medication (s) are due for refill today: see below  Requested medication (s) are on the active medication list: yes  Last refill:  06/11/22 #30/2  Future visit scheduled: no  Notes to clinic:  not delegated.   Pharmacy comment: REQUEST FOR 90 DAYS PRESCRIPTION.       Requested Prescriptions  Pending Prescriptions Disp Refills   ARIPiprazole (ABILIFY) 2 MG tablet [Pharmacy Med Name: ARIPIPRAZOLE 2 MG TABLET] 90 tablet 1    Sig: TAKE 1 TABLET BY MOUTH EVERY DAY     Not Delegated - Psychiatry:  Antipsychotics - Second Generation (Atypical) - aripiprazole Failed - 07/04/2022  1:32 PM      Failed - This refill cannot be delegated      Failed - TSH in normal range and within 360 days    TSH  Date Value Ref Range Status  01/18/2020 1.00 0.35 - 4.50 uIU/mL Final         Failed - Completed PHQ-2 or PHQ-9 in the last 360 days      Failed - Lipid Panel in normal range within the last 12 months    Cholesterol, Total  Date Value Ref Range Status  10/16/2017 209 (H) 100 - 199 mg/dL Final   Cholesterol  Date Value Ref Range Status  01/18/2020 217 (H) 0 - 200 mg/dL Final    Comment:    ATP III Classification       Desirable:  < 200 mg/dL               Borderline High:  200 - 239 mg/dL          High:  > = 240 mg/dL   LDL Cholesterol (Calc)  Date Value Ref Range Status  06/26/2018  mg/dL (calc) Final    Comment:    . LDL cholesterol not calculated. Triglyceride levels greater than 400 mg/dL invalidate calculated LDL results. . Reference range: <100 . Desirable range <100 mg/dL for primary prevention;   <70 mg/dL for patients with CHD or diabetic patients  with > or = 2 CHD risk factors. Marland Kitchen LDL-C is now calculated using the Martin-Hopkins  calculation, which is a validated novel method providing  better accuracy than the Friedewald equation in the  estimation of LDL-C.  Cresenciano Genre et al. Annamaria Helling. [id number]): 2061-2068   (http://education.[facility].com/faq/FAQ164)    Direct LDL  Date Value Ref Range Status  01/18/2020 145.0 mg/dL Final    Comment:    Optimal:  <100 mg/dLNear or Above Optimal:  100-129 mg/dLBorderline High:  130-159 mg/dLHigh:  160-189 mg/dLVery High:  >190 mg/dL   HDL  Date Value Ref Range Status  01/18/2020 43.50 >39.00 mg/dL Final  10/16/2017 42 >39 mg/dL Final   Triglycerides  Date Value Ref Range Status  01/18/2020 227.0 (H) 0.0 - 149.0 mg/dL Final    Comment:    Normal:  <150 mg/dLBorderline High:  150 - 199 mg/dL         Failed - CBC within normal limits and completed in the last 12 months    WBC  Date Value Ref Range Status  12/18/2021 9.6 4.0 - 10.5 K/uL Final   RBC  Date Value Ref Range Status  12/18/2021 5.32 (H) 3.87 - 5.11 MIL/uL Final   Hemoglobin  Date Value Ref Range Status  12/18/2021 14.6 12.0 - 15.0 g/dL Final  12/20/2020 16.2 (H) 11.1 - 15.9 g/dL Final   HCT  Date Value Ref Range Status  12/18/2021 43.0 36.0 - 46.0 % Final  Hematocrit  Date Value Ref Range Status  12/20/2020 47.4 (H) 34.0 - 46.6 % Final   MCHC  Date Value Ref Range Status  12/18/2021 33.3 30.0 - 36.0 g/dL Final   [facility]  Date Value Ref Range Status  12/18/2021 28.9 26.0 - 34.0 pg Final   MCV  Date Value Ref Range Status  12/18/2021 86.8 80.0 - 100.0 fL Final  12/20/2020 85 79 - 97 fL Final  07/10/2011 88 80 - 100 fL Final   No results found for: "PLTCOUNTKUC", "LABPLAT", "POCPLA" RDW  Date Value Ref Range Status  12/18/2021 12.9 11.5 - 15.5 % Final  12/20/2020 12.3 11.7 - 15.4 % Final  07/10/2011 12.6 11.5 - 14.5 % Final         Failed - CMP within normal limits and completed in the last 12 months    Albumin  Date Value Ref Range Status  05/17/2020 4.1 3.5 - 5.2 g/dL Final  02/28/2016 4.5 3.5 - 5.5 g/dL Final  07/10/2011 4.0 3.4 - 5.0 g/dL Final   Alkaline Phosphatase  Date Value Ref Range Status  01/18/2020 60 39 - 117 U/L Final  07/10/2011 61 50  - 136 Unit/L Final   Alkaline phosphatase (APISO)  Date Value Ref Range Status  06/26/2018 59 31 - 125 U/L Final   ALT  Date Value Ref Range Status  01/18/2020 21 0 - 35 U/L Final   SGPT (ALT)  Date Value Ref Range Status  07/10/2011 24 U/L Final    Comment:    12-78 NOTE: NEW REFERENCE RANGE 03/15/2011    AST  Date Value Ref Range Status  01/18/2020 18 0 - 37 U/L Final   SGOT(AST)  Date Value Ref Range Status  07/10/2011 23 15 - 37 Unit/L Final   BUN  Date Value Ref Range Status  12/18/2021 19 6 - 20 mg/dL Final  02/28/2016 10 6 - 24 mg/dL Final  07/10/2011 10 7 - 18 mg/dL Final   Calcium  Date Value Ref Range Status  05/17/2020 9.3 8.4 - 10.5 mg/dL Final   Calcium, Total  Date Value Ref Range Status  07/10/2011 8.7 8.5 - 10.1 mg/dL Final   Calcium, Ion  Date Value Ref Range Status  12/18/2021 1.15 1.15 - 1.40 mmol/L Final   CO2  Date Value Ref Range Status  05/17/2020 30 19 - 32 mEq/L Final   Co2  Date Value Ref Range Status  07/10/2011 24 21 - 32 mmol/L Final   TCO2  Date Value Ref Range Status  12/18/2021 22 22 - 32 mmol/L Final   Creat  Date Value Ref Range Status  06/26/2018 1.00 0.50 - 1.10 mg/dL Final   Creatinine, Ser  Date Value Ref Range Status  12/18/2021 0.70 0.44 - 1.00 mg/dL Final   Glucose  Date Value Ref Range Status  07/10/2011 142 (H) 65 - 99 mg/dL Final   Glucose, Bld  Date Value Ref Range Status  12/18/2021 101 (H) 70 - 99 mg/dL Final    Comment:    Glucose reference range applies only to samples taken after fasting for at least 8 hours.   Potassium  Date Value Ref Range Status  12/18/2021 4.1 3.5 - 5.1 mmol/L Final  07/10/2011 3.6 3.5 - 5.1 mmol/L Final   Sodium  Date Value Ref Range Status  12/18/2021 143 135 - 145 mmol/L Final  02/28/2016 140 134 - 144 mmol/L Final  07/10/2011 144 136 - 145 mmol/L Final   Total Bilirubin  Date Value  Ref Range Status  01/18/2020 0.5 0.2 - 1.2 mg/dL Final   Bilirubin,Total   Date Value Ref Range Status  07/10/2011 0.5 0.2 - 1.0 mg/dL Final   Bilirubin Total  Date Value Ref Range Status  02/28/2016 0.3 0.0 - 1.2 mg/dL Final   Protein, ur  Date Value Ref Range Status  12/01/2018 NEGATIVE NEGATIVE mg/dL Final   Protein, UA  Date Value Ref Range Status  05/17/2020 Negative Negative Final   Total Protein  Date Value Ref Range Status  01/18/2020 7.1 6.0 - 8.3 g/dL Final  02/28/2016 7.2 6.0 - 8.5 g/dL Final  07/10/2011 7.4 6.4 - 8.2 g/dL Final   EGFR (African American)  Date Value Ref Range Status  07/10/2011 >60 >25m/min Final   GFR calc Af Amer  Date Value Ref Range Status  12/01/2018 >60 >60 mL/min Final   GFR  Date Value Ref Range Status  05/17/2020 77.56 >60.00 mL/min Final    Comment:    Calculated using the CKD-EPI Creatinine Equation (2021)   EGFR (Non-African Amer.)  Date Value Ref Range Status  07/10/2011 >60 >611mmin Final    Comment:    eGFR values <6053min/1.73 m2 may be an indication of chronic kidney disease (CKD). Calculated eGFR, using the MRDR Study equation, is useful in  patients with stable renal function. The eGFR calculation will not be reliable in acutely ill patients when serum creatinine is changing rapidly. It is not useful in patients on dialysis. The eGFR calculation may not be applicable to patients at the low and high extremes of body sizes, pregnant women, and vegetarians.    GFR calc non Af Amer  Date Value Ref Range Status  12/01/2018 >60 >60 mL/min Final         Passed - Last BP in normal range    BP Readings from Last 1 Encounters:  12/18/21 114/84         Passed - Last Heart Rate in normal range    Pulse Readings from Last 1 Encounters:  12/18/21 (!) 51         Passed - Valid encounter within last 6 months    Recent Outpatient Visits           3 weeks ago Mild intermittent extrinsic [facility] without complication   [facility]   1 year  ago Acute bronchitis due to Haemophilus influenzae   [facility]   1 year ago Acute non-recurrent pansinusitis   [facility]   1 year ago Viral [facility]orhinitis   [facility]

## 2022-07-12 ENCOUNTER — Ambulatory Visit: Payer: Managed Care, Other (non HMO) | Admitting: Podiatry

## 2022-07-12 ENCOUNTER — Encounter: Payer: Self-pay | Admitting: Podiatry

## 2022-07-12 VITALS — BP 117/71 | HR 70

## 2022-07-12 DIAGNOSIS — M778 Other enthesopathies, not elsewhere classified: Secondary | ICD-10-CM

## 2022-07-12 DIAGNOSIS — M21961 Unspecified acquired deformity of right lower leg: Secondary | ICD-10-CM

## 2022-07-12 DIAGNOSIS — M21962 Unspecified acquired deformity of left lower leg: Secondary | ICD-10-CM

## 2022-07-12 NOTE — Progress Notes (Signed)
Subjective:  Patient ID: Tasha Miller, female    DOB: 10/24/1970,  MRN: [id number]  Chief Complaint  Patient presents with   Foot Pain    "It comes and goes.  The right one hurts the most."    52 y.o. female presents with the above complaint.  Patient presents with bilateral second metatarsophalangeal joint pain.  Patient states pain for touch is progressive gotten worse.  She had received injection with Dr. Milinda Pointer last year which helped considerably.  The right one hurts the most.  She wanted discuss treatment options for it.  She does not wear any kind of orthotics she does a lot on her feet.   Review of Systems: Negative except as noted in the HPI. Denies N/V/F/Ch.  Past Medical History:  Diagnosis Date   Asthma    History of kidney stones    PONV (postoperative nausea and vomiting)    Rubella    SVT (supraventricular tachycardia)     Current Outpatient Medications:    albuterol (VENTOLIN HFA) 108 (90 Base) MCG/ACT inhaler, Inhale 2 puffs into the lungs as needed for wheezing or shortness of breath., Disp: 1 each, Rfl: 2   cyanocobalamin (,VITAMIN B-12,) 1000 MCG/ML injection, Inject 1,000 mcg into the muscle every 14 (fourteen) days., Disp: , Rfl:    meloxicam (MOBIC) 15 MG tablet, TAKE 1 TABLET (15 MG TOTAL) BY MOUTH DAILY., Disp: 30 tablet, Rfl: 3   progesterone (PROMETRIUM) 100 MG capsule, Take 100 mg by mouth at bedtime., Disp: , Rfl:    Testosterone 200 MG PLLT, by Implant route., Disp: , Rfl:    traMADol (ULTRAM) 50 MG tablet, Take 1 tablet (50 mg total) by mouth daily as needed., Disp: 30 tablet, Rfl: 0   Vitamin D, Ergocalciferol, (DRISDOL) 1.25 MG (50000 UT) CAPS capsule, TAKE 1 CAPSULE BY MOUTH WEEKLY, Disp: , Rfl:    lamoTRIgine (LAMICTAL) 25 MG tablet, Take 1 tablet (25 mg total) by mouth daily for 14 days, THEN 2 tablets (50 mg total) daily., Disp: 166 tablet, Rfl: 0  Social History   Tobacco Use  Smoking Status Never  Smokeless Tobacco Never    Allergies   Allergen Reactions   Latex Itching and Swelling   Sulfa Antibiotics Itching and Nausea And Vomiting   Amoxicillin-Pot Clavulanate Nausea And Vomiting   Sulfonamide Derivatives Nausea And Vomiting   Objective:   Vitals:   07/12/22 1330  BP: 117/71  Pulse: 70   There is no height or weight on file to calculate BMI. Constitutional Well developed. Well nourished.  Vascular Dorsalis pedis pulses palpable bilaterally. Posterior tibial pulses palpable bilaterally. Capillary refill normal to all digits.  No cyanosis or clubbing noted. Pedal hair growth normal.  Neurologic Normal speech. Oriented to person, place, and time. Epicritic sensation to light touch grossly present bilaterally.  Dermatologic Nails well groomed and normal in appearance. No open wounds. No skin lesions.  Orthopedic: Pain on palpation bilateral second metatarsophalangeal joint pain with range of motion of the joint no deep intra-articular pain noted bilaterally.  No pain at the extensor or flexor tendinitis.  Patient has a high arch foot structure/previous construction which puts a lot of stress to the ball of the foot and primarily to the second metatarsophalangeal joint   Radiographs: None Assessment:   1. Capsulitis of foot, right   2. Capsulitis of foot, left   3. Deformity of both feet    Plan:  Patient was evaluated and treated and all questions answered.  Bilateral  second MTP capsulitis with underlying foot deformity -All questions and concerns were discussed with the patient in extensive detail given the amount of pain that she is experiencing on second metatarsophalangeal likely due to overload and the underlying pes Cavus deformity I believe patient will benefit from orthotics management.  I discussed with patient she states understand like to proceed with orthotic management.  I will also incorporate bilateral second MTP joint offloading in the orthotics  No follow-ups on file.

## 2022-07-17 ENCOUNTER — Encounter: Payer: Self-pay | Admitting: Internal Medicine

## 2022-07-17 MED ORDER — LAMOTRIGINE 25 MG PO TABS: ORAL_TABLET | ORAL | 0 refills | Status: DC

## 2022-08-28 ENCOUNTER — Ambulatory Visit: Payer: Managed Care, Other (non HMO) | Admitting: Podiatry

## 2022-09-19 ENCOUNTER — Ambulatory Visit: Payer: Managed Care, Other (non HMO) | Admitting: Podiatry

## 2022-09-19 ENCOUNTER — Encounter: Payer: Self-pay | Admitting: Podiatry

## 2022-09-19 VITALS — BP 133/83 | HR 79

## 2022-09-19 DIAGNOSIS — M21961 Unspecified acquired deformity of right lower leg: Secondary | ICD-10-CM

## 2022-09-19 DIAGNOSIS — M778 Other enthesopathies, not elsewhere classified: Secondary | ICD-10-CM | POA: Diagnosis not present

## 2022-09-19 DIAGNOSIS — M21962 Unspecified acquired deformity of left lower leg: Secondary | ICD-10-CM

## 2022-09-19 NOTE — Progress Notes (Signed)
Subjective:  Patient ID: Tasha Miller, female    DOB: 1970/09/07,  MRN: [id number]  Chief Complaint  Patient presents with   Foot Pain    "They're doing better than they were since the first time I came. They still hurt."    52 y.o. female presents with the above complaint.  Patient states she is following with bilateral second metatarsophalangeal joint.  She states she is doing better with the injection she still having some residual pain.  She had is also here to pick up orthotics   Review of Systems: Negative except as noted in the HPI. Denies N/V/F/Ch.  Past Medical History:  Diagnosis Date   Asthma    History of kidney stones    PONV (postoperative nausea and vomiting)    Rubella    SVT (supraventricular tachycardia)     Current Outpatient Medications:    albuterol (VENTOLIN HFA) 108 (90 Base) MCG/ACT inhaler, Inhale 2 puffs into the lungs as needed for wheezing or shortness of breath., Disp: 1 each, Rfl: 2   cyanocobalamin (,VITAMIN B-12,) 1000 MCG/ML injection, Inject 1,000 mcg into the muscle every 14 (fourteen) days., Disp: , Rfl:    lamoTRIgine (LAMICTAL) 25 MG tablet, Take 1 tablet (25 mg total) by mouth daily for 14 days, THEN 2 tablets (50 mg total) daily., Disp: 166 tablet, Rfl: 0   meloxicam (MOBIC) 15 MG tablet, TAKE 1 TABLET (15 MG TOTAL) BY MOUTH DAILY., Disp: 30 tablet, Rfl: 3   progesterone (PROMETRIUM) 100 MG capsule, Take 100 mg by mouth at bedtime., Disp: , Rfl:    Testosterone 200 MG PLLT, by Implant route., Disp: , Rfl:    traMADol (ULTRAM) 50 MG tablet, Take 1 tablet (50 mg total) by mouth daily as needed., Disp: 30 tablet, Rfl: 0   Vitamin D, Ergocalciferol, (DRISDOL) 1.25 MG (50000 UT) CAPS capsule, TAKE 1 CAPSULE BY MOUTH WEEKLY, Disp: , Rfl:   Social History   Tobacco Use  Smoking Status Never  Smokeless Tobacco Never    Allergies  Allergen Reactions   Latex Itching and Swelling   Sulfa Antibiotics Itching and Nausea And Vomiting    Amoxicillin-Pot Clavulanate Nausea And Vomiting   Sulfonamide Derivatives Nausea And Vomiting   Objective:   Vitals:   09/19/22 1324  BP: 133/83  Pulse: 79   There is no height or weight on file to calculate BMI. Constitutional Well developed. Well nourished.  Vascular Dorsalis pedis pulses palpable bilaterally. Posterior tibial pulses palpable bilaterally. Capillary refill normal to all digits.  No cyanosis or clubbing noted. Pedal hair growth normal.  Neurologic Normal speech. Oriented to person, place, and time. Epicritic sensation to light touch grossly present bilaterally.  Dermatologic Nails well groomed and normal in appearance. No open wounds. No skin lesions.  Orthopedic: Pain on palpation bilateral second metatarsophalangeal joint pain with range of motion of the joint no deep intra-articular pain noted bilaterally.  No pain at the extensor or flexor tendinitis.  Patient has a high arch foot structure/previous construction which puts a lot of stress to the ball of the foot and primarily to the second metatarsophalangeal joint   Radiographs: None Assessment:   No diagnosis found.  Plan:  Patient was evaluated and treated and all questions answered.  Bilateral second MTP capsulitis with underlying foot deformity -All questions and concerns were discussed with the patient in extensive detail given the amount of pain that she is experiencing on second metatarsophalangeal likely due to overload and the underlying pes Cavus  deformity I believe patient will benefit from orthotics management.  I discussed with patient she states understand like to proceed with orthotic management.  I will also incorporate bilateral second MTP joint offloading in the orthotics -A steroid injection was performed at bilateral second MTP using 1% plain Lidocaine and 10 mg of Kenalog. This was well tolerated. -Orthotics were dispensed and they are functioning well.   No follow-ups on file.

## 2022-09-19 NOTE — Patient Instructions (Signed)

## 2022-10-04 ENCOUNTER — Other Ambulatory Visit: Payer: Self-pay | Admitting: Internal Medicine

## 2022-10-04 NOTE — Telephone Encounter (Signed)
Requested Prescriptions  Pending Prescriptions Disp Refills   lamoTRIgine (LAMICTAL) 25 MG tablet [Pharmacy Med Name: LAMOTRIGINE 25 MG TABLET] 166 tablet 0    Sig: TAKE 1 TABLET (25 MG TOTAL) BY MOUTH DAILY FOR 14 DAYS, THEN 2 TABLETS (50 MG TOTAL) DAILY.     Neurology:  Anticonvulsants - lamotrigine Failed - 10/04/2022  2:22 AM      Failed - ALT in normal range and within 360 days    ALT  Date Value Ref Range Status  01/18/2020 21 0 - 35 U/L Final   SGPT (ALT)  Date Value Ref Range Status  07/10/2011 24 U/L Final    Comment:    12-78 NOTE: NEW REFERENCE RANGE 03/15/2011          Failed - AST in normal range and within 360 days    AST  Date Value Ref Range Status  01/18/2020 18 0 - 37 U/L Final   SGOT(AST)  Date Value Ref Range Status  07/10/2011 23 15 - 37 Unit/L Final         Failed - Completed PHQ-2 or PHQ-9 in the last 360 days      Passed - Cr in normal range and within 360 days    Creat  Date Value Ref Range Status  06/26/2018 1.00 0.50 - 1.10 mg/dL Final   Creatinine, Ser  Date Value Ref Range Status  12/18/2021 0.70 0.44 - 1.00 mg/dL Final         Passed - Valid encounter within last 12 months    Recent Outpatient Visits           3 months ago Mild intermittent extrinsic asthma without complication   [place] [facility] [place], Salvadore Oxford, NP   1 year ago Acute bronchitis due to Haemophilus influenzae   [place] [facility] [place], Salvadore Oxford, NP   1 year ago Acute non-recurrent pansinusitis    [facility] [place], Salvadore Oxford, NP   2 years ago Viral sinorhinitis   [facility] [place], Salvadore Oxford, [place]

## 2022-12-09 ENCOUNTER — Encounter: Payer: Self-pay | Admitting: Internal Medicine

## 2022-12-09 NOTE — Telephone Encounter (Signed)
Called pt and schedule her for 08/23 at 10am.   Thanks,   -Vernona Rieger

## 2022-12-10 ENCOUNTER — Telehealth: Payer: Self-pay

## 2022-12-10 NOTE — Transitions of Care (Post Inpatient/ED Visit) (Unsigned)
   12/10/2022  Name: Tasha Miller MRN: [id number] DOB: 09-12-1970  Today's TOC FU Call Status: Today's TOC FU Call Status:: Unsuccessful Call (1st Attempt) Unsuccessful Call (1st Attempt) Date: 12/10/22  Attempted to reach the patient regarding the most recent Inpatient/ED visit.  Follow Up Plan: Additional outreach attempts will be made to reach the patient to complete the Transitions of Care (Post Inpatient/ED visit) call.   Signature   Woodfin Ganja LPN [facility] Nurse Health Advisor Direct Dial [phone]

## 2022-12-11 NOTE — Telephone Encounter (Signed)
Will discuss at upcoming appointment

## 2022-12-11 NOTE — Transitions of Care (Post Inpatient/ED Visit) (Signed)
   12/11/2022  Name: Tasha Miller MRN: [id number] DOB: 16-Mar-1971  Today's TOC FU Call Status: Today's TOC FU Call Status:: Successful TOC FU Call Completed Unsuccessful Call (1st Attempt) Date: 12/10/22 [facility] FU Call Complete Date: 12/11/22  Transition Care Management Follow-up Telephone Call Date of Discharge: 12/09/22 Discharge Facility: Other ([facility]) Name of Other ([facility]) Discharge Facility: [facility] Type of Discharge: Inpatient Admission Primary Inpatient Discharge Diagnosis:: Diverticulitis How have you been since you were released from the hospital?: Better Any questions or concerns?: No  Items Reviewed: Did you receive and understand the discharge instructions provided?: Yes Medications obtained,verified, and reconciled?: Yes (Medications Reviewed) Any new allergies since your discharge?: No Dietary orders reviewed?: Yes Do you have support at home?: Yes  Medications Reviewed Today: Medications Reviewed Today     Reviewed by Merleen Nicely, LPN (Licensed Practical Nurse) on 12/11/22 at [phone]  Med List Status: <None>   Medication Order Taking? Sig Documenting Provider Last Dose Status Informant  albuterol (VENTOLIN HFA) 108 (90 Base) MCG/ACT inhaler [id number] Yes Inhale 2 puffs into the lungs as needed for wheezing or shortness of breath. Lorre Munroe, NP Taking Active   cyanocobalamin (,VITAMIN B-12,) 1000 MCG/ML injection [id number] Yes Inject 1,000 mcg into the muscle every 14 (fourteen) days. [provider] Taking Active   lamoTRIgine (LAMICTAL) 25 MG tablet [id number] No TAKE 1 TABLET (25 MG TOTAL) BY MOUTH DAILY FOR 14 DAYS, THEN 2 TABLETS (50 MG TOTAL) DAILY.  Patient not taking: Reported on 12/11/2022   Lorre Munroe, NP Not Taking Active   meloxicam (MOBIC) 15 MG tablet [id number] No TAKE 1 TABLET (15 MG TOTAL) BY MOUTH DAILY.  Patient not taking: Reported on 12/11/2022   Elinor Parkinson, [place] Not Taking Active   progesterone (PROMETRIUM) 100 MG  capsule [id number] Yes Take 100 mg by mouth at bedtime. [provider] Taking Active   Testosterone 200 MG PLLT [id number] Yes by Implant route. [provider] Taking Active   traMADol (ULTRAM) 50 MG tablet [id number] Yes Take 1 tablet (50 mg total) by mouth daily as needed. Lorre Munroe, NP Taking Active   Vitamin D, Ergocalciferol, (DRISDOL) 1.25 MG (50000 UT) CAPS capsule [id number] Yes TAKE 1 CAPSULE BY MOUTH WEEKLY [provider] Taking Active             Home Care and Equipment/Supplies: Were Home Health Services Ordered?: No Any new equipment or medical supplies ordered?: No  Functional Questionnaire: Do you need assistance with bathing/showering or dressing?: No Do you need assistance with meal preparation?: No Do you need assistance with eating?: No Do you have difficulty maintaining continence: No Do you need assistance with getting out of bed/getting out of a chair/moving?: No Do you have difficulty managing or taking your medications?: No  Follow up appointments reviewed: PCP Follow-up appointment confirmed?: Yes Date of PCP follow-up appointment?: 12/13/22 Follow-up Provider: Nicki Reaper NP [facility] Follow-up appointment confirmed?: No Do you need transportation to your follow-up appointment?: No Do you understand care options if your condition(s) worsen?: Yes-patient verbalized understanding    SIGNATURE  Woodfin Ganja LPN [facility] Nurse Health Advisor Direct Dial [phone]

## 2022-12-13 ENCOUNTER — Encounter: Payer: Self-pay | Admitting: Internal Medicine

## 2022-12-13 ENCOUNTER — Ambulatory Visit: Payer: Managed Care, Other (non HMO) | Admitting: Internal Medicine

## 2022-12-13 VITALS — BP 110/76 | HR 82 | Temp 96.6°F | Wt 139.0 lb

## 2022-12-13 DIAGNOSIS — K5792 Diverticulitis of intestine, part unspecified, without perforation or abscess without bleeding: Secondary | ICD-10-CM | POA: Diagnosis not present

## 2022-12-13 DIAGNOSIS — F3131 Bipolar disorder, current episode depressed, mild: Secondary | ICD-10-CM

## 2022-12-13 MED ORDER — ONDANSETRON 4 MG PO TBDP: 4.0000 mg | ORAL_TABLET | Freq: Three times a day (TID) | ORAL | 0 refills | Status: DC | PRN

## 2022-12-13 MED ORDER — TRAMADOL HCL 50 MG PO TABS: 50.0000 mg | ORAL_TABLET | Freq: Every day | ORAL | 0 refills | Status: DC | PRN

## 2022-12-13 NOTE — Progress Notes (Signed)
Subjective:    Patient ID: Tasha Miller, female    DOB: 08-Jan-1971, 51 y.o.   MRN: [id number]  HPI  Patient presents to clinic today for ER follow-up and TCM hospital follow-up.  She presented to the ER 8/13 with complaint of left lower quadrant pain and worsening depression.  CT abdomen/pelvis showed diverticulitis.  She was started on cipro and flagyl.  Psychiatry was consulted.  They did not feel like she was a harm to herself or others therefore declined inpatient admission.  She was started on buspirone and was set up for outpatient med management and therapy.  She subsequently presented back to the ER 8/17 with complaint of worsening LLQ abdominal pain.  CT abdomen/pelvis showed persistent diverticulitis without perforation or abscess.  Her white count was slightly more elevated than prior.  She was started on IV Zosyn.  GI was consulted.  They recommended she have a colonoscopy as an outpatient.  She was discharged on 8/19 with cefuroxime and metronidazole.  Since that time, she is having some nausea and slight abdominal pain, but she denies vomiting, constipation, diarrhea or blood in her stool. She denies fever, chills or body aches. She has been taking zofran with some relief of symptoms. She would like a refill of this today. She has her appt with GI scheduled. She is taking the buspirone but reports it is making her sedated. She saw psychiatry as an outpatient and they wanted to start her on fluoxetine, but she has not started the fluoxetine. She has her first therapy appt today.   Review of Systems     Past Medical History:  Diagnosis Date   Asthma    History of kidney stones    PONV (postoperative nausea and vomiting)    Rubella    SVT (supraventricular tachycardia)     Current Outpatient Medications  Medication Sig Dispense Refill   albuterol (VENTOLIN HFA) 108 (90 Base) MCG/ACT inhaler Inhale 2 puffs into the lungs as needed for wheezing or shortness of breath. 1 each 2    cyanocobalamin (,VITAMIN B-12,) 1000 MCG/ML injection Inject 1,000 mcg into the muscle every 14 (fourteen) days.     lamoTRIgine (LAMICTAL) 25 MG tablet TAKE 1 TABLET (25 MG TOTAL) BY MOUTH DAILY FOR 14 DAYS, THEN 2 TABLETS (50 MG TOTAL) DAILY. (Patient not taking: Reported on 12/11/2022) 166 tablet 0   meloxicam (MOBIC) 15 MG tablet TAKE 1 TABLET (15 MG TOTAL) BY MOUTH DAILY. (Patient not taking: Reported on 12/11/2022) 30 tablet 3   progesterone (PROMETRIUM) 100 MG capsule Take 100 mg by mouth at bedtime.     Testosterone 200 MG PLLT by Implant route.     traMADol (ULTRAM) 50 MG tablet Take 1 tablet (50 mg total) by mouth daily as needed. 30 tablet 0   Vitamin D, Ergocalciferol, (DRISDOL) 1.25 MG (50000 UT) CAPS capsule TAKE 1 CAPSULE BY MOUTH WEEKLY     No current facility-administered medications for this visit.    Allergies  Allergen Reactions   Latex Itching and Swelling   Sulfa Antibiotics Itching and Nausea And Vomiting   Amoxicillin-Pot Clavulanate Nausea And Vomiting   Sulfonamide Derivatives Nausea And Vomiting    Family History  Problem Relation Age of Onset   Cancer Maternal Aunt        thyroid cancer   Cancer Maternal Uncle    Diabetes Paternal Aunt    Heart disease Neg Hx    Stroke Neg Hx    Breast cancer Neg Hx  Social History   Socioeconomic History   Marital status: Media planner    Spouse name: Not on file   Number of children: Not on file   Years of education: Not on file   Highest education level: Not on file  Occupational History   Not on file  Tobacco Use   Smoking status: Never   Smokeless tobacco: Never  Vaping Use   Vaping status: Never Used  Substance and Sexual Activity   Alcohol use: No   Drug use: No   Sexual activity: Yes  Other Topics Concern   Not on file  Social History Narrative   Not on file   Social Determinants of Health   Financial Resource Strain: Low Risk  (12/11/2022)   [facility]    Overall Financial Resource Strain (CARDIA)    Difficulty of Paying Living Expenses: Not very hard  Food Insecurity: No Food Insecurity (12/11/2022)   Received from [facility]   Hunger Vital Sign    Worried About Running Out of Food in the Last Year: Never true    Ran Out of Food in the Last Year: Never true  Transportation Needs: No Transportation Needs (12/11/2022)   Received from [facility] - Transportation    In the past 12 months, has lack of transportation kept you from medical appointments or from getting medications?: No    Lack of Transportation (Non-Medical): No  Physical Activity: Not on file  Stress: Not on file  Social Connections: Not on file  Intimate Partner Violence: Not on file     Constitutional: Denies fever, malaise, fatigue, headache or abrupt weight changes.  HEENT: Denies eye pain, eye redness, ear pain, ringing in the ears, wax buildup, runny nose, nasal congestion, bloody nose, or sore throat. Respiratory: Denies difficulty breathing, shortness of breath, cough or sputum production.   Cardiovascular: Denies chest pain, chest tightness, palpitations or swelling in the hands or feet.  Gastrointestinal: Pt reports nausea, abdominal pain. Denies bloating, constipation, diarrhea or blood in the stool.  GU: Denies urgency, frequency, pain with urination, burning sensation, blood in urine, odor or discharge. Musculoskeletal: Patient reports chronic foot pain.  Denies decrease in range of motion, difficulty with gait, muscle pain or joint swelling.  Skin: Denies redness, rashes, lesions or ulcercations.  Neurological: Denies dizziness, difficulty with memory, difficulty with speech or problems with balance and coordination.  Psych: Patient has a history of depression.  Denies anxiety, depression, SI/HI.  No other specific complaints in a complete review of systems (except as listed in HPI above).  Objective:   Physical  Exam  BP 110/76 (BP Location: Left Arm, Patient Position: Sitting, Cuff Size: Normal)   Pulse 82   Temp (!) 96.6 F (35.9 C) (Temporal)   Wt 139 lb (63 kg)   SpO2 97%   BMI 23.86 kg/m   Wt Readings from Last 3 Encounters:  12/18/21 141 lb 3.2 oz (64 kg)  03/20/21 138 lb 12.8 oz (63 kg)  02/01/21 142 lb (64.4 kg)    General: Appears her stated age, well developed, well nourished in NAD. Skin: Warm, dry and intact.  HEENT: Head: normal shape and size; Eyes: sclera white, no icterus, conjunctiva pink, PERRLA and EOMs intact;  Cardiovascular: Normal rate and rhythm. S1,S2 noted.  No murmur, rubs or gallops noted. No JVD or BLE edema. Pulmonary/Chest: Normal effort and positive vesicular breath sounds. No respiratory distress. No wheezes, rales or ronchi  noted.  Abdomen: Soft and mildly tender in the LLQ. Normal bowel sounds. No distention or masses noted. Musculoskeletal:  No difficulty with gait.  Neurological: Alert and oriented. Coordination normal.  Psychiatric: Mood and affect mildly flat. Behavior is normal. Judgment and thought content normal.    BMET    Component Value Date/Time   NA 143 12/18/2021 0657   NA 140 02/28/2016 0900   NA 144 07/10/2011 1025   K 4.1 12/18/2021 0657   K 3.6 07/10/2011 1025   CL 108 12/18/2021 0657   CL 108 (H) 07/10/2011 1025   CO2 30 05/17/2020 1059   CO2 24 07/10/2011 1025   GLUCOSE 101 (H) 12/18/2021 0657   GLUCOSE 142 (H) 07/10/2011 1025   BUN 19 12/18/2021 0657   BUN 10 02/28/2016 0900   BUN 10 07/10/2011 1025   CREATININE 0.70 12/18/2021 0657   CREATININE 1.00 06/26/2018 1521   CALCIUM 9.3 05/17/2020 1059   CALCIUM 8.7 07/10/2011 1025   GFRNONAA >60 12/01/2018 2325   GFRNONAA >60 07/10/2011 1025   GFRAA >60 12/01/2018 2325   GFRAA >60 07/10/2011 1025    Lipid Panel     Component Value Date/Time   CHOL 217 (H) 01/18/2020 1542   CHOL 209 (H) 10/16/2017 1438   TRIG 227.0 (H) 01/18/2020 1542   HDL 43.50 01/18/2020 1542    HDL 42 10/16/2017 1438   CHOLHDL 5 01/18/2020 1542   VLDL 45.4 (H) 01/18/2020 1542   LDLCALC  06/26/2018 1521     Comment:     . LDL cholesterol not calculated. Triglyceride levels greater than 400 mg/dL invalidate calculated LDL results. . Reference range: <100 . Desirable range <100 mg/dL for primary prevention;   <70 mg/dL for patients with CHD or diabetic patients  with > or = 2 CHD risk factors. Marland Kitchen LDL-C is now calculated using the Martin-Hopkins  calculation, which is a validated novel method providing  better accuracy than the Friedewald equation in the  estimation of LDL-C.  Horald Pollen et al. Lenox Ahr. 3664;403(47): 2061-2068  (http://education.[employer].com/faq/FAQ164)     CBC    Component Value Date/Time   WBC 9.6 12/18/2021 0539   RBC 5.32 (H) 12/18/2021 0539   HGB 14.6 12/18/2021 0657   HGB 16.2 (H) 12/20/2020 1105   HCT 43.0 12/18/2021 0657   HCT 47.4 (H) 12/20/2020 1105   PLT 231 12/18/2021 0539   PLT 209 12/20/2020 1105   MCV 86.8 12/18/2021 0539   MCV 85 12/20/2020 1105   MCV 88 07/10/2011 1025   MCH 28.9 12/18/2021 0539   MCHC 33.3 12/18/2021 0539   RDW 12.9 12/18/2021 0539   RDW 12.3 12/20/2020 1105   RDW 12.6 07/10/2011 1025   LYMPHSABS 2.5 12/20/2020 1105   MONOABS 0.8 12/01/2018 2325   EOSABS 0.3 12/20/2020 1105   BASOSABS 0.1 12/20/2020 1105    Hgb A1C Lab Results  Component Value Date   HGBA1C 5.9 01/18/2020           Assessment & Plan:   ER and TCM hospital follow-up for diverticulitis, depression:  ER/hospital notes, labs and imaging reviewed She will continue cefuroxime and metronidazole until course completed Rx for Zofran 4 mg ODT every 8 hours as needed for nausea Tramadol refilled today She has a follow-up appointment with GI scheduled She will continue buspirone as previously prescribed She plans to start fluoxetine as requested by psychiatry She plans to follow-up with psychiatry and her therapist as an  outpatient  Schedule an appointment for your  annual exam Nicki Reaper, NP

## 2022-12-13 NOTE — Patient Instructions (Signed)
Diverticulitis  Diverticulitis happens when poop (stool) and bacteria get trapped in small pouches in the colon called diverticula. These pouches may form if you have a condition called diverticulosis. When the poop and bacteria get trapped, it can cause an infection and inflammation. Diverticulitis may cause severe stomach pain and diarrhea. It can also lead to tissue damage in your colon. This can cause bleeding or blockage. In some cases, the diverticula may burst (rupture). This can cause infected poop to go into other parts of your abdomen. What are the causes? This condition is caused by poop getting trapped in the diverticula. This allows bacteria to grow. It can lead to inflammation and infection. What increases the risk? You are more likely to get this condition if you have diverticulosis. You are also more at risk if: You are overweight or obese. You do not get enough exercise. You drink alcohol. You smoke. You eat a lot of red meat, such as beef, pork, or lamb. You do not get enough fiber. Foods high in fiber include fruits, vegetables, beans, nuts, and whole grains. You are over 53 years of age. What are the signs or symptoms? Symptoms of this condition may include: Pain and tenderness in the abdomen. This pain is often felt on the left side but may occur in other spots. Fever and chills. Nausea and vomiting. Cramping. Bloating. Changes in how often you poop. Blood in your poop. How is this diagnosed? This condition is diagnosed based on your medical history and a physical exam. You may also have tests done to make sure there is nothing else causing your condition. These tests may include: Blood tests. Tests done on your pee (urine). A CT scan of the abdomen. You may need to have a colonoscopy. This is an exam to look at your whole large intestine. During the exam, a tube is put into the opening of your butt (anus) and then moved into your rectum, colon, and other parts of  the large intestine. This exam is done to look at the diverticula. It can also see if there is something else that may be causing your symptoms. How is this treated? Most cases are mild and can be treated at home. You may be told to: Take over-the-counter pain medicine. Only eat and drink clear liquids. Take antibiotics. Rest. More severe cases may need to be treated at a hospital. Treatment may include: Not eating or drinking. Taking pain medicines. Getting antibiotics through an IV. Getting fluids and nutrition through an IV. Surgery. Follow these instructions at home: Medicines Take over-the-counter and prescription medicines only as told by your health care provider. These include fiber supplements, probiotics, and medicines to soften your poop (stool softeners). If you were prescribed antibiotics, take them as told by your provider. Do not stop using the antibiotic even if you start to feel better. Ask your provider if the medicine prescribed to you requires you to avoid driving or using machinery. Eating and drinking  Follow the diet told by your provider. You may need to only eat and drink liquids. After your symptoms get better, you may be able to return to a more normal diet. You may be told to eat at least 25 grams (25 g) of fiber each day. Fiber makes it easier to poop. Healthy sources of fiber include: Berries. One cup has 4-8 g of fiber. Beans or lentils. One-half cup has 5-8 g of fiber. Green vegetables. One cup has 4 g of fiber. Avoid eating red meat.  General instructions Do not use any products that contain nicotine or tobacco. These products include cigarettes, chewing tobacco, and vaping devices, such as e-cigarettes. If you need help quitting, ask your provider. Exercise for at least 30 minutes, 3 times a week. Exercise hard enough to raise your heart rate and break a sweat. Contact a health care provider if: Your pain gets worse. Your pooping does not go back to  normal. Your symptoms do not get better with treatment. Your symptoms get worse all of a sudden. You have a fever. You vomit more than one time. Your poop is bloody, black, or tarry. This information is not intended to replace advice given to you by your health care provider. Make sure you discuss any questions you have with your health care provider. Document Revised: 01/03/2022 Document Reviewed: 01/03/2022 Elsevier Patient Education  2024 [employer].

## 2023-02-21 ENCOUNTER — Ambulatory Visit: Payer: Managed Care, Other (non HMO)

## 2023-02-21 DIAGNOSIS — K5730 Diverticulosis of large intestine without perforation or abscess without bleeding: Secondary | ICD-10-CM | POA: Diagnosis not present

## 2023-02-21 DIAGNOSIS — K5792 Diverticulitis of intestine, part unspecified, without perforation or abscess without bleeding: Secondary | ICD-10-CM | POA: Diagnosis present

## 2023-02-21 DIAGNOSIS — K640 First degree hemorrhoids: Secondary | ICD-10-CM | POA: Diagnosis not present

## 2023-03-07 ENCOUNTER — Encounter: Payer: Self-pay | Admitting: Internal Medicine

## 2023-03-08 ENCOUNTER — Other Ambulatory Visit: Payer: Self-pay | Admitting: Internal Medicine

## 2023-03-10 NOTE — Telephone Encounter (Signed)
Requested medications are due for refill today.  yes  Requested medications are on the active medications list.  yes  Last refill. 12/13/2022 #30 0 rf  Future visit scheduled.   no  Notes to clinic.  Refill not delegated.    Requested Prescriptions  Pending Prescriptions Disp Refills   traMADol (ULTRAM) 50 MG tablet [Pharmacy Med Name: TRAMADOL HCL 50 MG TABLET] 30 tablet 0    Sig: TAKE 1 TABLET BY MOUTH DAILY AS NEEDED.     Not Delegated - Analgesics:  Opioid Agonists Failed - 03/08/2023 11:44 AM      Failed - This refill cannot be delegated      Failed - Urine Drug Screen completed in last 360 days      Passed - Valid encounter within last 3 months    Recent Outpatient Visits           2 months ago Diverticulitis   [place] [facility] [place], [place] W, NP   9 months ago Mild intermittent extrinsic asthma without complication   [place] [facility] [place], Salvadore Oxford, NP   1 year ago Acute bronchitis due to Haemophilus influenzae   [place] [facility] [place], Salvadore Oxford, NP   2 years ago Acute non-recurrent pansinusitis    [facility] [place], Salvadore Oxford, NP   2 years ago Viral sinorhinitis   [place] [facility] [place], Salvadore Oxford, [place]

## 2023-04-18 ENCOUNTER — Ambulatory Visit (INDEPENDENT_AMBULATORY_CARE_PROVIDER_SITE_OTHER): Payer: Managed Care, Other (non HMO) | Admitting: Internal Medicine

## 2023-04-18 ENCOUNTER — Encounter: Payer: Self-pay | Admitting: Internal Medicine

## 2023-04-18 VITALS — BP 120/74 | HR 76 | Wt 148.0 lb

## 2023-04-18 DIAGNOSIS — E041 Nontoxic single thyroid nodule: Secondary | ICD-10-CM

## 2023-04-18 DIAGNOSIS — Z114 Encounter for screening for human immunodeficiency virus [HIV]: Secondary | ICD-10-CM

## 2023-04-18 DIAGNOSIS — Z1231 Encounter for screening mammogram for malignant neoplasm of breast: Secondary | ICD-10-CM

## 2023-04-18 DIAGNOSIS — R7303 Prediabetes: Secondary | ICD-10-CM

## 2023-04-18 DIAGNOSIS — Z0001 Encounter for general adult medical examination with abnormal findings: Secondary | ICD-10-CM

## 2023-04-18 DIAGNOSIS — Z1159 Encounter for screening for other viral diseases: Secondary | ICD-10-CM

## 2023-04-18 DIAGNOSIS — R7989 Other specified abnormal findings of blood chemistry: Secondary | ICD-10-CM

## 2023-04-18 DIAGNOSIS — E010 Iodine-deficiency related diffuse (endemic) goiter: Secondary | ICD-10-CM

## 2023-04-18 NOTE — Progress Notes (Signed)
Subjective:    Patient ID: Tasha Miller, female    DOB: 12/20/1970, 52 y.o.   MRN: [id number]  HPI  Patient presents to clinic today for annual exam.  Flu: 12/2022 Tetanus: 06/2018 COVID: x 1 Shingrix: never Pap smear: 05/2022 Mammogram: 05/2021 Colon screening: 02/2023 Vision screening: annually Dentist: biannually  Diet: She does eat meat. She consumes fruits and veggies. She does eat fried foods. She drinks mostly dt. Mt dew. Exercise: None  Review of Systems   Past Medical History:  Diagnosis Date   Asthma    History of kidney stones    PONV (postoperative nausea and vomiting)    Rubella    SVT (supraventricular tachycardia)     Current Outpatient Medications  Medication Sig Dispense Refill   albuterol (VENTOLIN HFA) 108 (90 Base) MCG/ACT inhaler Inhale 2 puffs into the lungs as needed for wheezing or shortness of breath. 1 each 2   cyanocobalamin (,VITAMIN B-12,) 1000 MCG/ML injection Inject 1,000 mcg into the muscle every 14 (fourteen) days.     ondansetron (ZOFRAN-ODT) 4 MG disintegrating tablet Take 1 tablet (4 mg total) by mouth every 8 (eight) hours as needed for nausea or vomiting. 30 tablet 0   progesterone (PROMETRIUM) 100 MG capsule Take 100 mg by mouth at bedtime.     Testosterone 200 MG PLLT by Implant route.     traMADol (ULTRAM) 50 MG tablet Take 1 tablet (50 mg total) by mouth daily as needed. 30 tablet 0   Vitamin D, Ergocalciferol, (DRISDOL) 1.25 MG (50000 UT) CAPS capsule TAKE 1 CAPSULE BY MOUTH WEEKLY     No current facility-administered medications for this visit.    Allergies  Allergen Reactions   Latex Itching and Swelling   Sulfa Antibiotics Itching and Nausea And Vomiting   Amoxicillin-Pot Clavulanate Nausea And Vomiting   Sulfonamide Derivatives Nausea And Vomiting    Family History  Problem Relation Age of Onset   Cancer Maternal Aunt        thyroid cancer   Cancer Maternal Uncle    Diabetes Paternal Aunt    Heart disease Neg Hx     Stroke Neg Hx    Breast cancer Neg Hx     Social History   Socioeconomic History   Marital status: Media planner    Spouse name: Not on file   Number of children: Not on file   Years of education: Not on file   Highest education level: Not on file  Occupational History   Not on file  Tobacco Use   Smoking status: Never   Smokeless tobacco: Never  Vaping Use   Vaping status: Never Used  Substance and Sexual Activity   Alcohol use: No   Drug use: No   Sexual activity: Yes  Other Topics Concern   Not on file  Social History Narrative   Not on file   Social Drivers of Health   Financial Resource Strain: Low Risk  (12/11/2022)   Received from [facility]   Overall Financial Resource Strain (CARDIA)    Difficulty of Paying Living Expenses: Not very hard  Food Insecurity: No Food Insecurity (12/11/2022)   Received from [facility]   Hunger Vital Sign    Worried About Running Out of Food in the Last Year: Never true    Ran Out of Food in the Last Year: Never true  Transportation Needs: No Transportation Needs (12/11/2022)   Received from [facility]  PRAPARE - Transportation    In the past 12 months, has lack of transportation kept you from medical appointments or from getting medications?: No    Lack of Transportation (Non-Medical): No  Physical Activity: Not on file  Stress: Not on file  Social Connections: Not on file  Intimate Partner Violence: Not on file     Constitutional: Denies fever, malaise, fatigue, headache or abrupt weight changes.  HEENT: Denies eye pain, eye redness, ear pain, ringing in the ears, wax buildup, runny nose, nasal congestion, bloody nose, or sore throat. Respiratory: Denies difficulty breathing, shortness of breath, cough or sputum production.   Cardiovascular: Denies chest pain, chest tightness, palpitations or swelling in the hands or feet.  Gastrointestinal: Denies abdominal pain,  bloating, constipation, diarrhea or blood in the stool.  GU: Denies urgency, frequency, pain with urination, burning sensation, blood in urine, odor or discharge. Musculoskeletal: Patient reports chronic joint pain.  Denies decrease in range of motion, difficulty with gait, muscle pain or joint swelling.  Skin: Denies redness, rashes, lesions or ulcercations.  Neurological: Denies dizziness, difficulty with memory, difficulty with speech or problems with balance and coordination.  Psych: Patient has a history of depression.  Denies anxiety, depression, SI/HI.  No other specific complaints in a complete review of systems (except as listed in HPI above).      Objective:   Physical Exam  BP 120/74   Pulse 76   Wt 148 lb (67.1 kg)   BMI 25.40 kg/m   Wt Readings from Last 3 Encounters:  12/13/22 139 lb (63 kg)  12/18/21 141 lb 3.2 oz (64 kg)  03/20/21 138 lb 12.8 oz (63 kg)    General: Appears her stated age, well developed, well nourished in NAD. Skin: Warm, dry and intact.  HEENT: Head: normal shape and size; Eyes: sclera white, no icterus, conjunctiva pink, PERRLA and EOMs intact;  Neck:  Neck supple, trachea midline.  Thyromegaly with right thyroid nodule noted. Cardiovascular: Normal rate and rhythm. S1,S2 noted.  No murmur, rubs or gallops noted. No JVD or BLE edema. No carotid bruits noted. Pulmonary/Chest: Normal effort and positive vesicular breath sounds. No respiratory distress. No wheezes, rales or ronchi noted.  Abdomen: Soft and nontender. Normal bowel sounds.  Musculoskeletal: Strength 5/5 BUE/BLE.  No difficulty with gait.  Neurological: Alert and oriented. Cranial nerves II-XII grossly intact. Coordination normal.  Psychiatric: Mood and affect normal. Behavior is normal. Judgment and thought content normal.    BMET    Component Value Date/Time   NA 143 12/18/2021 0657   NA 140 02/28/2016 0900   NA 144 07/10/2011 1025   K 4.1 12/18/2021 0657   K 3.6 07/10/2011  1025   CL 108 12/18/2021 0657   CL 108 (H) 07/10/2011 1025   CO2 30 05/17/2020 1059   CO2 24 07/10/2011 1025   GLUCOSE 101 (H) 12/18/2021 0657   GLUCOSE 142 (H) 07/10/2011 1025   BUN 19 12/18/2021 0657   BUN 10 02/28/2016 0900   BUN 10 07/10/2011 1025   CREATININE 0.70 12/18/2021 0657   CREATININE 1.00 06/26/2018 1521   CALCIUM 9.3 05/17/2020 1059   CALCIUM 8.7 07/10/2011 1025   GFRNONAA >60 12/01/2018 2325   GFRNONAA >60 07/10/2011 1025   GFRAA >60 12/01/2018 2325   GFRAA >60 07/10/2011 1025    Lipid Panel     Component Value Date/Time   CHOL 217 (H) 01/18/2020 1542   CHOL 209 (H) 10/16/2017 1438   TRIG 227.0 (H) 01/18/2020 1542  HDL 43.50 01/18/2020 1542   HDL 42 10/16/2017 1438   CHOLHDL 5 01/18/2020 1542   VLDL 45.4 (H) 01/18/2020 1542   LDLCALC  06/26/2018 1521     Comment:     . LDL cholesterol not calculated. Triglyceride levels greater than 400 mg/dL invalidate calculated LDL results. . Reference range: <100 . Desirable range <100 mg/dL for primary prevention;   <70 mg/dL for patients with CHD or diabetic patients  with > or = 2 CHD risk factors. Marland Kitchen LDL-C is now calculated using the Martin-Hopkins  calculation, which is a validated novel method providing  better accuracy than the Friedewald equation in the  estimation of LDL-C.  Horald Pollen et al. Lenox Ahr. 2130;865(78): 2061-2068  (http://education.[facility].com/faq/FAQ164)     CBC    Component Value Date/Time   WBC 9.6 12/18/2021 0539   RBC 5.32 (H) 12/18/2021 0539   HGB 14.6 12/18/2021 0657   HGB 16.2 (H) 12/20/2020 1105   HCT 43.0 12/18/2021 0657   HCT 47.4 (H) 12/20/2020 1105   PLT 231 12/18/2021 0539   PLT 209 12/20/2020 1105   MCV 86.8 12/18/2021 0539   MCV 85 12/20/2020 1105   MCV 88 07/10/2011 1025   MCH 28.9 12/18/2021 0539   MCHC 33.3 12/18/2021 0539   RDW 12.9 12/18/2021 0539   RDW 12.3 12/20/2020 1105   RDW 12.6 07/10/2011 1025   LYMPHSABS 2.5 12/20/2020 1105   MONOABS 0.8  12/01/2018 2325   EOSABS 0.3 12/20/2020 1105   BASOSABS 0.1 12/20/2020 1105    Hgb A1C Lab Results  Component Value Date   HGBA1C 5.9 01/18/2020            Assessment & Plan:    Preventative health maintenance:  Flu shot UTD Tetanus UTD Encouraged her to get her COVID-vaccine Shingrix vaccine, she will check coverage with her insurance company and schedule visit if she would like to have this done Pap smear UTD Mammogram ordered-she will call to schedule Colon screening UTD Encouraged her to consume a balanced diet and exercise regimen Advised her to see an eye doctor and dentist annually We will check CBC, c-Met, lipid, A1c, HIV and hep C today  Thyromegaly with thyroid nodule:  TSH today Low to thyroid ultrasound  RTC in 6 months, follow-up chronic conditions Nicki Reaper, NP

## 2023-04-18 NOTE — Patient Instructions (Signed)
Health Maintenance for Postmenopausal Women Menopause is a normal process in which your ability to get pregnant comes to an end. This process happens slowly over many months or years, usually between the ages of 48 and 55. Menopause is complete when you have missed your menstrual period for 12 months. It is important to talk with your health care provider about some of the most common conditions that affect women after menopause (postmenopausal women). These include heart disease, cancer, and bone loss (osteoporosis). Adopting a healthy lifestyle and getting preventive care can help to promote your health and wellness. The actions you take can also lower your chances of developing some of these common conditions. What are the signs and symptoms of menopause? During menopause, you may have the following symptoms: Hot flashes. These can be moderate or severe. Night sweats. Decrease in sex drive. Mood swings. Headaches. Tiredness (fatigue). Irritability. Memory problems. Problems falling asleep or staying asleep. Talk with your health care provider about treatment options for your symptoms. Do I need hormone replacement therapy? Hormone replacement therapy is effective in treating symptoms that are caused by menopause, such as hot flashes and night sweats. Hormone replacement carries certain risks, especially as you become older. If you are thinking about using estrogen or estrogen with progestin, discuss the benefits and risks with your health care provider. How can I reduce my risk for heart disease and stroke? The risk of heart disease, heart attack, and stroke increases as you age. One of the causes may be a change in the body's hormones during menopause. This can affect how your body uses dietary fats, triglycerides, and cholesterol. Heart attack and stroke are medical emergencies. There are many things that you can do to help prevent heart disease and stroke. Watch your blood pressure High  blood pressure causes heart disease and increases the risk of stroke. This is more likely to develop in people who have high blood pressure readings or are overweight. Have your blood pressure checked: Every 3-5 years if you are 18-39 years of age. Every year if you are 40 years old or older. Eat a healthy diet  Eat a diet that includes plenty of vegetables, fruits, low-fat dairy products, and lean protein. Do not eat a lot of foods that are high in solid fats, added sugars, or sodium. Get regular exercise Get regular exercise. This is one of the most important things you can do for your health. Most adults should: Try to exercise for at least 150 minutes each week. The exercise should increase your heart rate and make you sweat (moderate-intensity exercise). Try to do strengthening exercises at least twice each week. Do these in addition to the moderate-intensity exercise. Spend less time sitting. Even light physical activity can be beneficial. Other tips Work with your health care provider to achieve or maintain a healthy weight. Do not use any products that contain nicotine or tobacco. These products include cigarettes, chewing tobacco, and vaping devices, such as e-cigarettes. If you need help quitting, ask your health care provider. Know your numbers. Ask your health care provider to check your cholesterol and your blood sugar (glucose). Continue to have your blood tested as directed by your health care provider. Do I need screening for cancer? Depending on your health history and family history, you may need to have cancer screenings at different stages of your life. This may include screening for: Breast cancer. Cervical cancer. Lung cancer. Colorectal cancer. What is my risk for osteoporosis? After menopause, you may be   at increased risk for osteoporosis. Osteoporosis is a condition in which bone destruction happens more quickly than new bone creation. To help prevent osteoporosis or  the bone fractures that can happen because of osteoporosis, you may take the following actions: If you are 19-50 years old, get at least 1,000 mg of calcium and at least 600 international units (IU) of vitamin D per day. If you are older than age 50 but younger than age 70, get at least 1,200 mg of calcium and at least 600 international units (IU) of vitamin D per day. If you are older than age 70, get at least 1,200 mg of calcium and at least 800 international units (IU) of vitamin D per day. Smoking and drinking excessive alcohol increase the risk of osteoporosis. Eat foods that are rich in calcium and vitamin D, and do weight-bearing exercises several times each week as directed by your health care provider. How does menopause affect my mental health? Depression may occur at any age, but it is more common as you become older. Common symptoms of depression include: Feeling depressed. Changes in sleep patterns. Changes in appetite or eating patterns. Feeling an overall lack of motivation or enjoyment of activities that you previously enjoyed. Frequent crying spells. Talk with your health care provider if you think that you are experiencing any of these symptoms. General instructions See your health care provider for regular wellness exams and vaccines. This may include: Scheduling regular health, dental, and eye exams. Getting and maintaining your vaccines. These include: Influenza vaccine. Get this vaccine each year before the flu season begins. Pneumonia vaccine. Shingles vaccine. Tetanus, diphtheria, and pertussis (Tdap) booster vaccine. Your health care provider may also recommend other immunizations. Tell your health care provider if you have ever been abused or do not feel safe at home. Summary Menopause is a normal process in which your ability to get pregnant comes to an end. This condition causes hot flashes, night sweats, decreased interest in sex, mood swings, headaches, or lack  of sleep. Treatment for this condition may include hormone replacement therapy. Take actions to keep yourself healthy, including exercising regularly, eating a healthy diet, watching your weight, and checking your blood pressure and blood sugar levels. Get screened for cancer and depression. Make sure that you are up to date with all your vaccines. This information is not intended to replace advice given to you by your health care provider. Make sure you discuss any questions you have with your health care provider. Document Revised: 08/28/2020 Document Reviewed: 08/28/2020 Elsevier Patient Education  2024 Elsevier Inc.  

## 2023-04-19 LAB — COMPLETE METABOLIC PANEL WITH GFR
AG Ratio: 1.4 (calc) (ref 1.0–2.5)
ALT: 32 U/L — ABNORMAL HIGH (ref 6–29)
AST: 21 U/L (ref 10–35)
Albumin: 4.4 g/dL (ref 3.6–5.1)
Alkaline phosphatase (APISO): 76 U/L (ref 37–153)
BUN: 13 mg/dL (ref 7–25)
CO2: 22 mmol/L (ref 20–32)
Calcium: 9.5 mg/dL (ref 8.6–10.4)
Chloride: 105 mmol/L (ref 98–110)
Creat: 0.87 mg/dL (ref 0.50–1.03)
Globulin: 3.1 g/dL (ref 1.9–3.7)
Glucose, Bld: 206 mg/dL — ABNORMAL HIGH (ref 65–139)
Potassium: 4.3 mmol/L (ref 3.5–5.3)
Sodium: 139 mmol/L (ref 135–146)
Total Bilirubin: 0.4 mg/dL (ref 0.2–1.2)
Total Protein: 7.5 g/dL (ref 6.1–8.1)
eGFR: 80 mL/min/{1.73_m2} (ref >=60)

## 2023-04-19 LAB — CBC
HCT: 48.9 % — ABNORMAL HIGH (ref 35.0–45.0)
Hemoglobin: 16.2 g/dL — ABNORMAL HIGH (ref 11.7–15.5)
MCH: 29.1 pg (ref 27.0–33.0)
MCHC: 33.1 g/dL (ref 32.0–36.0)
MCV: 87.8 fL (ref 80.0–100.0)
MPV: 10.5 fL (ref 7.5–12.5)
Platelets: 349 10*3/uL (ref 140–400)
RBC: 5.57 10*6/uL — ABNORMAL HIGH (ref 3.80–5.10)
RDW: 11.8 % (ref 11.0–15.0)
WBC: 24 10*3/uL — ABNORMAL HIGH (ref 3.8–10.8)

## 2023-04-19 LAB — LIPID PANEL
Cholesterol: 226 mg/dL — ABNORMAL HIGH (ref <200)
HDL: 56 mg/dL (ref >=50)
LDL Cholesterol (Calc): 149 mg/dL — ABNORMAL HIGH
Non-HDL Cholesterol (Calc): 170 mg/dL — ABNORMAL HIGH (ref <130)
Total CHOL/HDL Ratio: 4 (calc) (ref <5.0)
Triglycerides: 99 mg/dL (ref <150)

## 2023-04-19 LAB — HIV ANTIBODY (ROUTINE TESTING W REFLEX): HIV 1&2 Ab, 4th Generation: NONREACTIVE

## 2023-04-19 LAB — HEPATITIS C ANTIBODY: Hepatitis C Ab: NONREACTIVE

## 2023-04-19 LAB — HEMOGLOBIN A1C
Hgb A1c MFr Bld: 5.8 %{Hb} — ABNORMAL HIGH (ref <5.7)
Mean Plasma Glucose: 120 mg/dL
eAG (mmol/L): 6.6 mmol/L

## 2023-04-19 LAB — TSH: TSH: 0.26 m[IU]/L — ABNORMAL LOW

## 2023-04-21 ENCOUNTER — Encounter: Payer: Self-pay | Admitting: Internal Medicine

## 2023-04-21 NOTE — Addendum Note (Signed)
Addended by: Lorre Munroe on: 04/21/2023 07:43 AM   Modules accepted: Orders

## 2023-04-25 ENCOUNTER — Ambulatory Visit: Admission: RE | Admit: 2023-04-25 | Payer: Managed Care, Other (non HMO) | Source: Ambulatory Visit

## 2023-04-28 ENCOUNTER — Other Ambulatory Visit: Payer: Self-pay

## 2023-04-28 ENCOUNTER — Ambulatory Visit: Payer: Managed Care, Other (non HMO)

## 2023-04-28 ENCOUNTER — Other Ambulatory Visit: Payer: Self-pay | Admitting: Internal Medicine

## 2023-04-28 ENCOUNTER — Other Ambulatory Visit: Payer: Managed Care, Other (non HMO)

## 2023-04-28 DIAGNOSIS — D72828 Other elevated white blood cell count: Secondary | ICD-10-CM

## 2023-04-28 DIAGNOSIS — R7989 Other specified abnormal findings of blood chemistry: Secondary | ICD-10-CM

## 2023-04-29 ENCOUNTER — Encounter: Payer: Self-pay | Admitting: Internal Medicine

## 2023-04-30 LAB — CBC WITH DIFFERENTIAL/PLATELET
Absolute Lymphocytes: 4055 {cells}/uL — ABNORMAL HIGH (ref 850–3900)
Absolute Monocytes: 614 {cells}/uL (ref 200–950)
Basophils Absolute: 74 {cells}/uL (ref 0–200)
Basophils Relative: 0.8 %
Eosinophils Absolute: 233 {cells}/uL (ref 15–500)
Eosinophils Relative: 2.5 %
HCT: 44.7 % (ref 35.0–45.0)
Hemoglobin: 14.9 g/dL (ref 11.7–15.5)
MCH: 29.2 pg (ref 27.0–33.0)
MCHC: 33.3 g/dL (ref 32.0–36.0)
MCV: 87.6 fL (ref 80.0–100.0)
MPV: 10.8 fL (ref 7.5–12.5)
Monocytes Relative: 6.6 %
Neutro Abs: 4325 {cells}/uL (ref 1500–7800)
Neutrophils Relative %: 46.5 %
Platelets: 252 10*3/uL (ref 140–400)
RBC: 5.1 10*6/uL (ref 3.80–5.10)
RDW: 11.9 % (ref 11.0–15.0)
Total Lymphocyte: 43.6 %
WBC: 9.3 10*3/uL (ref 3.8–10.8)

## 2023-04-30 LAB — TSH: TSH: 1.26 m[IU]/L

## 2023-04-30 LAB — THYROID PEROXIDASE ANTIBODIES (TPO) (REFL): Thyroperoxidase Ab SerPl-aCnc: <1 [IU]/mL (ref <9)

## 2023-04-30 LAB — T4, FREE: Free T4: 1.2 ng/dL (ref 0.8–1.8)

## 2023-04-30 LAB — T3: T3, Total: 90 ng/dL (ref 76–181)

## 2023-05-23 ENCOUNTER — Ambulatory Visit: Payer: Managed Care, Other (non HMO)

## 2023-07-01 ENCOUNTER — Ambulatory Visit: Admitting: Podiatry

## 2023-07-01 DIAGNOSIS — M778 Other enthesopathies, not elsewhere classified: Secondary | ICD-10-CM

## 2023-07-01 NOTE — Progress Notes (Signed)
 Subjective:  Patient ID: Tasha Miller, female    DOB: 17-Sep-1970,  MRN: [id number]  Chief Complaint  Patient presents with   Foot Pain    53 y.o. female presents with the above complaint.  Patient states she is following with bilateral second metatarsophalangeal joint.  She states that her pain started coming back denies any other acute noted like to do another injection orthotics are functioning well  Review of Systems: Negative except as noted in the HPI. Denies N/V/F/Ch.  Past Medical History:  Diagnosis Date   Asthma    History of kidney stones    PONV (postoperative nausea and vomiting)    Rubella    SVT (supraventricular tachycardia) (HCC)     Current Outpatient Medications:    albuterol (VENTOLIN HFA) 108 (90 Base) MCG/ACT inhaler, Inhale 2 puffs into the lungs as needed for wheezing or shortness of breath., Disp: 1 each, Rfl: 2   cyanocobalamin (,VITAMIN B-12,) 1000 MCG/ML injection, Inject 1,000 mcg into the muscle every 14 (fourteen) days., Disp: , Rfl:    ondansetron (ZOFRAN-ODT) 4 MG disintegrating tablet, Take 1 tablet (4 mg total) by mouth every 8 (eight) hours as needed for nausea or vomiting., Disp: 30 tablet, Rfl: 0   progesterone (PROMETRIUM) 100 MG capsule, Take 100 mg by mouth at bedtime., Disp: , Rfl:    Testosterone 200 MG PLLT, by Implant route., Disp: , Rfl:    traMADol (ULTRAM) 50 MG tablet, Take 1 tablet (50 mg total) by mouth daily as needed., Disp: 30 tablet, Rfl: 0   Vitamin D, Ergocalciferol, (DRISDOL) 1.25 MG (50000 UT) CAPS capsule, TAKE 1 CAPSULE BY MOUTH WEEKLY, Disp: , Rfl:   Social History   Tobacco Use  Smoking Status Never  Smokeless Tobacco Never    Allergies  Allergen Reactions   Latex Itching and Swelling   Sulfa Antibiotics Itching and Nausea And Vomiting   Amoxicillin-Pot Clavulanate Nausea And Vomiting   Sulfonamide Derivatives Nausea And Vomiting   Objective:   There were no vitals filed for this visit.  There is no height  or weight on file to calculate BMI. Constitutional Well developed. Well nourished.  Vascular Dorsalis pedis pulses palpable bilaterally. Posterior tibial pulses palpable bilaterally. Capillary refill normal to all digits.  No cyanosis or clubbing noted. Pedal hair growth normal.  Neurologic Normal speech. Oriented to person, place, and time. Epicritic sensation to light touch grossly present bilaterally.  Dermatologic Nails well groomed and normal in appearance. No open wounds. No skin lesions.  Orthopedic: Pain on palpation bilateral second metatarsophalangeal joint pain with range of motion of the joint no deep intra-articular pain noted bilaterally.  No pain at the extensor or flexor tendinitis.  Patient has a high arch foot structure/previous construction which puts a lot of stress to the ball of the foot and primarily to the second metatarsophalangeal joint   Radiographs: None Assessment:   No diagnosis found.  Plan:  Patient was evaluated and treated and all questions answered.  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  I discussed with patient she states understand like to proceed with orthotic management.  I will also incorporate bilateral second MTP joint offloading in the orthotics -Another steroid injection was performed at bilateral second MTP using  1% plain Lidocaine and 10 mg of Kenalog. This was well tolerated. -Orthotics were dispensed and they are functioning well.   No follow-ups on file.

## 2023-08-19 ENCOUNTER — Ambulatory Visit: Admitting: Podiatry

## 2023-08-19 DIAGNOSIS — M778 Other enthesopathies, not elsewhere classified: Secondary | ICD-10-CM

## 2023-08-19 NOTE — Progress Notes (Signed)
 Subjective:  Patient ID: Tasha Miller, female    DOB: 12-12-1970,  MRN: [id number]  Chief Complaint  Patient presents with   Foot Pain    Pt stated that she is still having pain with her left foot     53 y.o. female presents with the above complaint.  Patient states she is following with left second metatarsophalangeal joint.  She states that her pain started coming back denies any other acute noted like to do another injection orthotics are functioning well.  The right side is feeling better no pain.  Review of Systems: Negative except as noted in the HPI. Denies N/V/F/Ch.  Past Medical History:  Diagnosis Date   Asthma    History of kidney stones    PONV (postoperative nausea and vomiting)    Rubella    SVT (supraventricular tachycardia) (HCC)     Current Outpatient Medications:    albuterol  (VENTOLIN  HFA) 108 (90 Base) MCG/ACT inhaler, Inhale 2 puffs into the lungs as needed for wheezing or shortness of breath., Disp: 1 each, Rfl: 2   cyanocobalamin (,VITAMIN B-12,) 1000 MCG/ML injection, Inject 1,000 mcg into the muscle every 14 (fourteen) days., Disp: , Rfl:    ondansetron  (ZOFRAN -ODT) 4 MG disintegrating tablet, Take 1 tablet (4 mg total) by mouth every 8 (eight) hours as needed for nausea or vomiting., Disp: 30 tablet, Rfl: 0   progesterone (PROMETRIUM) 100 MG capsule, Take 100 mg by mouth at bedtime., Disp: , Rfl:    Testosterone 200 MG PLLT, by Implant route., Disp: , Rfl:    traMADol  (ULTRAM ) 50 MG tablet, Take 1 tablet (50 mg total) by mouth daily as needed., Disp: 30 tablet, Rfl: 0   Vitamin D , Ergocalciferol , (DRISDOL ) 1.25 MG (50000 UT) CAPS capsule, TAKE 1 CAPSULE BY MOUTH WEEKLY, Disp: , Rfl:   Social History   Tobacco Use  Smoking Status Never  Smokeless Tobacco Never    Allergies  Allergen Reactions   Latex Itching and Swelling   Sulfa Antibiotics Itching and Nausea And Vomiting   Amoxicillin -Pot Clavulanate Nausea And Vomiting   Sulfonamide Derivatives  Nausea And Vomiting   Objective:   There were no vitals filed for this visit.  There is no height or weight on file to calculate BMI. Constitutional Well developed. Well nourished.  Vascular Dorsalis pedis pulses palpable bilaterally. Posterior tibial pulses palpable bilaterally. Capillary refill normal to all digits.  No cyanosis or clubbing noted. Pedal hair growth normal.  Neurologic Normal speech. Oriented to person, place, and time. Epicritic sensation to light touch grossly present bilaterally.  Dermatologic Nails well groomed and normal in appearance. No open wounds. No skin lesions.  Orthopedic: Pain on palpation left second metatarsophalangeal joint pain with range of motion of the joint no deep intra-articular pain noted bilaterally.  No pain at the extensor or flexor tendinitis.  Patient has a high arch foot structure/previous construction which puts a lot of stress to the ball of the foot and primarily to the second metatarsophalangeal joint   Radiographs: None Assessment:   No diagnosis found.  Plan:  Patient was evaluated and treated and all questions answered.  Left second MTP capsulitis with underlying foot deformity -All questions and concerns were discussed with the patient in extensive detail given the amount of pain that she is experiencing on second metatarsophalangeal likely due to overload and the underlying pes Cavus deformity I believe patient will benefit from orthotics management.  I discussed with patient she states understand like to proceed with  orthotic management.  I will also incorporate bilateral second MTP joint offloading in the orthotics -Another steroid injection was performed at last second MTP using 1% plain Lidocaine  and 10 mg of Kenalog . This was well tolerated. -Orthotics were dispensed and they are functioning well.   No follow-ups on file.

## 2024-02-18 ENCOUNTER — Ambulatory Visit: Admitting: Internal Medicine

## 2024-02-18 ENCOUNTER — Encounter: Payer: Self-pay | Admitting: Internal Medicine

## 2024-02-18 VITALS — BP 124/78 | Ht 64.0 in | Wt 153.0 lb

## 2024-02-18 DIAGNOSIS — R350 Frequency of micturition: Secondary | ICD-10-CM

## 2024-02-18 DIAGNOSIS — R3915 Urgency of urination: Secondary | ICD-10-CM

## 2024-02-18 DIAGNOSIS — R10A3 Flank pain, bilateral: Secondary | ICD-10-CM | POA: Diagnosis not present

## 2024-02-18 LAB — POCT URINE DIPSTICK
Bilirubin, UA: NEGATIVE
Blood, UA: NEGATIVE
Glucose, UA: NEGATIVE mg/dL
Ketones, POC UA: NEGATIVE mg/dL
Nitrite, UA: NEGATIVE
POC PROTEIN,UA: NEGATIVE
Spec Grav, UA: 1.015 (ref 1.010–1.025)
Urobilinogen, UA: 0.2 U/dL
pH, UA: 5 (ref 5.0–8.0)

## 2024-02-18 MED ORDER — NITROFURANTOIN MONOHYD MACRO 100 MG PO CAPS: 100.0000 mg | ORAL_CAPSULE | Freq: Two times a day (BID) | ORAL | 0 refills | Status: DC

## 2024-02-18 NOTE — Progress Notes (Signed)
 Subjective:    Patient ID: Tasha Miller, female    DOB: 1970/05/12, 53 y.o.   MRN: [id number]  HPI  Discussed the use of AI scribe software for clinical note transcription with the patient, who gave verbal consent to proceed.  Tasha Miller is a 53 year old female who presents with bilateral flank pain and urinary symptoms.  She has been experiencing bilateral flank pain for about a week, initially attributing it to back pain. The pain is located in the kidney area on both sides.  During a routine urine test at an annual GYN visit, her urine showed greater than 100,000 colonies of lactobacillus. She did not initially associate these results with her symptoms.  Her GYN did not treat her for this.  She reports increased urinary frequency and urgency, stating that when she gets the urge to urinate, she must go immediately. There is no pain or burning during urination, and no blood in the urine.  No fever, chills, or vomiting, but she reports nausea, for which she took Zofran  this morning. She experienced difficulty regulating her body temperature, feeling cold last night and very warm this morning.  She has a known allergy to sulfa and penicillin and cannot take ibuprofen  due to her diverticulitis.       Review of Systems   Past Medical History:  Diagnosis Date   Asthma    History of kidney stones    PONV (postoperative nausea and vomiting)    Rubella    SVT (supraventricular tachycardia)     Current Outpatient Medications  Medication Sig Dispense Refill   albuterol  (VENTOLIN  HFA) 108 (90 Base) MCG/ACT inhaler Inhale 2 puffs into the lungs as needed for wheezing or shortness of breath. 1 each 2   cyanocobalamin (,VITAMIN B-12,) 1000 MCG/ML injection Inject 1,000 mcg into the muscle every 14 (fourteen) days.     ondansetron  (ZOFRAN -ODT) 4 MG disintegrating tablet Take 1 tablet (4 mg total) by mouth every 8 (eight) hours as needed for nausea or vomiting. 30 tablet 0   progesterone  (PROMETRIUM) 100 MG capsule Take 100 mg by mouth at bedtime.     Testosterone 200 MG PLLT by Implant route.     traMADol  (ULTRAM ) 50 MG tablet Take 1 tablet (50 mg total) by mouth daily as needed. 30 tablet 0   Vitamin D , Ergocalciferol , (DRISDOL ) 1.25 MG (50000 UT) CAPS capsule TAKE 1 CAPSULE BY MOUTH WEEKLY     No current facility-administered medications for this visit.    Allergies  Allergen Reactions   Latex Itching and Swelling   Sulfa Antibiotics Itching and Nausea And Vomiting   Amoxicillin -Pot Clavulanate Nausea And Vomiting   Sulfonamide Derivatives Nausea And Vomiting    Family History  Problem Relation Age of Onset   Cancer Maternal Aunt        thyroid  cancer   Cancer Maternal Uncle    Diabetes Paternal Aunt    Heart disease Neg Hx    Stroke Neg Hx    Breast cancer Neg Hx     Social History   Socioeconomic History   Marital status: Media Planner    Spouse name: Not on file   Number of children: Not on file   Years of education: Not on file   Highest education level: Not on file  Occupational History   Not on file  Tobacco Use   Smoking status: Never   Smokeless tobacco: Never  Vaping Use   Vaping status: Never Used  Substance and Sexual Activity   Alcohol use: No   Drug use: No   Sexual activity: Yes  Other Topics Concern   Not on file  Social History Narrative   Not on file   Social Drivers of Health   Financial Resource Strain: Low Risk  (12/11/2022)   Received from [facility]   Overall Financial Resource Strain (CARDIA)    Difficulty of Paying Living Expenses: Not very hard  Food Insecurity: No Food Insecurity (12/11/2022)   Received from [facility]   Hunger Vital Sign    Within the past 12 months, you worried that your food would run out before you got the money to buy more.: Never true    Within the past 12 months, the food you bought just didn't last and you didn't have money to get more.: Never true   Transportation Needs: No Transportation Needs (12/11/2022)   Received from [facility] - [facility]    In the past 12 months, has lack of transportation kept you from medical appointments or from getting medications?: No    Lack of Transportation (Non-Medical): No  Physical Activity: Not on file  Stress: Not on file  Social Connections: Not on file  Intimate Partner Violence: Not on file     Constitutional: Denies fever, malaise, fatigue, headache or abrupt weight changes.  Respiratory: Denies difficulty breathing, shortness of breath, cough or sputum production.   Cardiovascular: Denies chest pain, chest tightness, palpitations or swelling in the hands or feet.  Gastrointestinal: Denies abdominal pain, bloating, constipation, diarrhea or blood in the stool.  GU: Patient reports urinary urgency, frequency and bilateral flank pain.  Denies pain with urination, burning sensation, blood in urine, odor or discharge. Musculoskeletal: Patient reports chronic joint pain.  Denies decrease in range of motion, difficulty with gait, muscle pain or joint swelling.  Neurological: Denies dizziness, difficulty with memory, difficulty with speech or problems with balance and coordination.   No other specific complaints in a complete review of systems (except as listed in HPI above).      Objective:   Physical Exam  BP 124/78 (BP Location: Left Arm, Patient Position: Sitting, Cuff Size: Normal)   Ht 5' 4 (1.626 m)   Wt 153 lb (69.4 kg)   BMI 26.26 kg/m    Wt Readings from Last 3 Encounters:  04/18/23 148 lb (67.1 kg)  12/13/22 139 lb (63 kg)  12/18/21 141 lb 3.2 oz (64 kg)    General: Appears her stated age, well developed, well nourished in NAD. Skin: Warm, dry and intact.  Cardiovascular: Normal rate and rhythm.  Pulmonary/Chest: Normal effort and positive vesicular breath sounds. No respiratory distress. No wheezes, rales or ronchi noted.  Abdomen: Soft  and nontender.  No CVA tenderness noted. Musculoskeletal:   No difficulty with gait.  Neurological: Alert and oriented.   BMET    Component Value Date/Time   NA 139 04/18/2023 1106   NA 140 02/28/2016 0900   NA 144 07/10/2011 1025   K 4.3 04/18/2023 1106   K 3.6 07/10/2011 1025   CL 105 04/18/2023 1106   CL 108 (H) 07/10/2011 1025   CO2 22 04/18/2023 1106   CO2 24 07/10/2011 1025   GLUCOSE 206 (H) 04/18/2023 1106   GLUCOSE 142 (H) 07/10/2011 1025   BUN 13 04/18/2023 1106   BUN 10 02/28/2016 0900   BUN 10 07/10/2011 1025   CREATININE 0.87 04/18/2023 1106   CALCIUM  9.5 04/18/2023 1106   CALCIUM 8.7 07/10/2011 1025   GFRNONAA >60 12/01/2018 2325   GFRNONAA >60 07/10/2011 1025   GFRAA >60 12/01/2018 2325   GFRAA >60 07/10/2011 1025    Lipid Panel     Component Value Date/Time   CHOL 226 (H) 04/18/2023 1106   CHOL 209 (H) 10/16/2017 1438   TRIG 99 04/18/2023 1106   HDL 56 04/18/2023 1106   HDL 42 10/16/2017 1438   CHOLHDL 4.0 04/18/2023 1106   VLDL 45.4 (H) 01/18/2020 1542   LDLCALC 149 (H) 04/18/2023 1106    CBC    Component Value Date/Time   WBC 9.3 04/28/2023 1059   RBC 5.10 04/28/2023 1059   HGB 14.9 04/28/2023 1059   HGB 16.2 (H) 12/20/2020 1105   HCT 44.7 04/28/2023 1059   HCT 47.4 (H) 12/20/2020 1105   PLT 252 04/28/2023 1059   PLT 209 12/20/2020 1105   MCV 87.6 04/28/2023 1059   MCV 85 12/20/2020 1105   MCV 88 07/10/2011 1025   MCH 29.2 04/28/2023 1059   MCHC 33.3 04/28/2023 1059   RDW 11.9 04/28/2023 1059   RDW 12.3 12/20/2020 1105   RDW 12.6 07/10/2011 1025   LYMPHSABS 2.5 12/20/2020 1105   MONOABS 0.8 12/01/2018 2325   EOSABS 233 04/28/2023 1059   EOSABS 0.3 12/20/2020 1105   BASOSABS 74 04/28/2023 1059   BASOSABS 0.1 12/20/2020 1105    Hgb A1C Lab Results  Component Value Date   HGBA1C 5.8 (H) 04/18/2023            Assessment & Plan:   Assessment and Plan    Urinary tract infection Urinary frequency and urgency.  Previous  urine culture 1 week ago grew greater than 100,000 colonies lactobacillus.  Urinalysis today shows moderate leukocytes - Prescribed Macrobid  100 mg BID for 5 days. - Sent urine culture for analysis. - Advised increased fluid intake.      Schedule an appointment for your annual exam Angeline Laura, NP

## 2024-02-18 NOTE — Patient Instructions (Signed)

## 2024-02-19 LAB — URINE CULTURE
MICRO NUMBER:: 17164636
SPECIMEN QUALITY:: ADEQUATE

## 2024-02-20 ENCOUNTER — Ambulatory Visit: Payer: Self-pay | Admitting: Internal Medicine

## 2024-03-31 ENCOUNTER — Encounter: Payer: Self-pay | Admitting: Internal Medicine

## 2024-04-02 ENCOUNTER — Ambulatory Visit: Admitting: Internal Medicine

## 2024-04-02 ENCOUNTER — Encounter (HOSPITAL_BASED_OUTPATIENT_CLINIC_OR_DEPARTMENT_OTHER): Payer: Self-pay | Admitting: Orthopedic Surgery

## 2024-04-02 ENCOUNTER — Encounter: Payer: Self-pay | Admitting: Internal Medicine

## 2024-04-02 ENCOUNTER — Other Ambulatory Visit: Payer: Self-pay

## 2024-04-02 VITALS — BP 112/74 | Ht 64.0 in | Wt 146.2 lb

## 2024-04-02 DIAGNOSIS — K644 Residual hemorrhoidal skin tags: Secondary | ICD-10-CM | POA: Diagnosis not present

## 2024-04-02 MED ORDER — HYDROCORT-PRAMOXINE (PERIANAL) 1-1 % EX FOAM: 1.0000 | Freq: Two times a day (BID) | CUTANEOUS | 0 refills | Status: AC

## 2024-04-02 NOTE — Patient Instructions (Signed)
 Hemorrhoids Hemorrhoids are swollen veins that may form: In the butt (rectum). These are called internal hemorrhoids. Around the opening of the butt (anus). These are called external hemorrhoids. Most hemorrhoids do not cause very bad problems. They often get better with changes to your lifestyle and what you eat. What are the causes? Having trouble pooping (constipation) or watery poop (diarrhea). Pushing too hard when you poop. Pregnancy. Being very overweight (obese). Sitting for too long. Riding a bike for a long time. Heavy lifting or other things that take a lot of effort. Anal sex. What are the signs or symptoms? Pain. Itching or soreness in the butt. Bleeding from the butt. Leaking poop. Swelling. One or more lumps around the opening of your butt. How is this treated? In most cases, hemorrhoids can be treated at home. You may be told to: Change what you eat. Make changes to your lifestyle. If these treatments do not help, you may need to have a procedure done. Your doctor may need to: Place rubber bands at the bottom of the hemorrhoids to make them fall off. Put medicine into the hemorrhoids to shrink them. Shine a type of light on the hemorrhoids to cause them to fall off. Do surgery to get rid of the hemorrhoids. Follow these instructions at home: Medicines Take over-the-counter and prescription medicines only as told by your doctor. Use creams with medicine in them or medicines that you put in your butt as told by your doctor. Eating and drinking  Eat foods that have a lot of fiber in them. These include whole grains, beans, nuts, fruits, and vegetables. Ask your doctor about taking products that have fiber added to them (fibersupplements). Take in less fat. You can do this by: Eating low-fat dairy products. Eating less red meat. Staying away from processed foods. Drink enough fluid to keep your pee (urine) pale yellow. Managing pain and swelling  Take a  warm-water bath (sitz bath) for 20 minutes to ease pain. Do this 3-4 times a day. You may do this in a bathtub. You may also use a portable sitz bath that fits over the toilet. If told, put ice on the painful area. It may help to use ice between your warm baths. Put ice in a plastic bag. Place a towel between your skin and the bag. Leave the ice on for 20 minutes, 2-3 times a day. If your skin turns bright red, take off the ice right away to prevent skin damage. The risk of damage is higher if you cannot feel pain, heat, or cold. General instructions Exercise. Ask your doctor how much and what kind of exercise is best for you. Go to the bathroom when you need to poop. Do not wait. Try not to push too hard when you poop. Keep your butt dry and clean. Use wet toilet paper or moist towelettes after you poop. Do not sit on the toilet for a long time. Contact a doctor if: You have pain and swelling that do not get better with treatment. You have trouble pooping. You cannot poop. You have pain or swelling outside the area of the hemorrhoids. Get help right away if: You have bleeding from the butt that will not stop. This information is not intended to replace advice given to you by your health care provider. Make sure you discuss any questions you have with your health care provider. Document Revised: 12/19/2021 Document Reviewed: 12/19/2021 Elsevier Patient Education  2024 [employer].

## 2024-04-02 NOTE — Progress Notes (Signed)
 Subjective:    Patient ID: Tasha Miller, female    DOB: Nov 02, 1970, 53 y.o.   MRN: [id number]  HPI  Discussed the use of AI scribe software for clinical note transcription with the patient, who gave verbal consent to proceed.  Tasha Miller is a 53 year old female who presents with rectal pain and a persistent external hemorrhoid.  She experienced the onset of rectal pain and a persistent external hemorrhoid two days after Thanksgiving, following episodes of diarrhea after consuming leftover food. She had two episodes of diarrhea, after which she noticed a fullness in the rectal area. The hemorrhoid was initially about the size of a marble and hard, but it has since decreased in size.  Despite using treatments such as witch hazel, Preparation H, and lidocaine , the hemorrhoid has not completely resolved. She reports pain associated with the hemorrhoid but has no rectal bleeding or itching. She has a history of a similar occurrence a few months prior, which resolved after about a week and a half. She has not experienced constipation, and the hemorrhoid appeared after the diarrhea episodes.  She attempted to schedule an appointment with a gastroenterologist but was unable to secure one until June. She is frustrated with the situation, noting that a walk-in clinic would not be able to address her needs adequately.       Review of Systems   Past Medical History:  Diagnosis Date   Asthma    History of kidney stones    PONV (postoperative nausea and vomiting)    Rubella    SVT (supraventricular tachycardia)     Current Outpatient Medications  Medication Sig Dispense Refill   albuterol  (VENTOLIN  HFA) 108 (90 Base) MCG/ACT inhaler Inhale 2 puffs into the lungs as needed for wheezing or shortness of breath. 1 each 2   cyanocobalamin (,VITAMIN B-12,) 1000 MCG/ML injection Inject 1,000 mcg into the muscle every 14 (fourteen) days.     FLUoxetine (PROZAC) 20 MG capsule Take 20 mg by mouth  daily.     nitrofurantoin , macrocrystal-monohydrate, (MACROBID ) 100 MG capsule Take 1 capsule (100 mg total) by mouth 2 (two) times daily. 10 capsule 0   ondansetron  (ZOFRAN -ODT) 4 MG disintegrating tablet Take 1 tablet (4 mg total) by mouth every 8 (eight) hours as needed for nausea or vomiting. (Patient not taking: Reported on 02/18/2024) 30 tablet 0   progesterone (PROMETRIUM) 100 MG capsule Take 100 mg by mouth at bedtime.     Testosterone 200 MG PLLT by Implant route.     traMADol  (ULTRAM ) 50 MG tablet Take 1 tablet (50 mg total) by mouth daily as needed. 30 tablet 0   Vitamin D , Ergocalciferol , (DRISDOL ) 1.25 MG (50000 UT) CAPS capsule TAKE 1 CAPSULE BY MOUTH WEEKLY     No current facility-administered medications for this visit.    Allergies  Allergen Reactions   Latex Itching and Swelling   Sulfa Antibiotics Itching and Nausea And Vomiting   Amoxicillin -Pot Clavulanate Nausea And Vomiting   Sulfonamide Derivatives Nausea And Vomiting    Family History  Problem Relation Age of Onset   Cancer Maternal Aunt        thyroid  cancer   Cancer Maternal Uncle    Diabetes Paternal Aunt    Heart disease Neg Hx    Stroke Neg Hx    Breast cancer Neg Hx     Social History   Socioeconomic History   Marital status: Media Planner    Spouse name: Not on file  Number of children: Not on file   Years of education: Not on file   Highest education level: Not on file  Occupational History   Not on file  Tobacco Use   Smoking status: Never   Smokeless tobacco: Never  Vaping Use   Vaping status: Never Used  Substance and Sexual Activity   Alcohol use: No   Drug use: No   Sexual activity: Yes  Other Topics Concern   Not on file  Social History Narrative   Not on file   Social Drivers of Health   Tobacco Use: Low Risk (02/18/2024)   Patient History    Smoking Tobacco Use: Never    Smokeless Tobacco Use: Never    Passive Exposure: Not on file  Financial Resource Strain:  Low Risk  (12/11/2022)   Received from [facility]   Overall Financial Resource Strain (CARDIA)    Difficulty of Paying Living Expenses: Not very hard  Food Insecurity: No Food Insecurity (12/11/2022)   Received from [facility]   Epic    Within the past 12 months, you worried that your food would run out before you got the money to buy more.: Never true    Within the past 12 months, the food you bought just didn't last and you didn't have money to get more.: Never true  Transportation Needs: No Transportation Needs (12/11/2022)   Received from [facility] - Transportation    In the past 12 months, has lack of transportation kept you from medical appointments or from getting medications?: No    Lack of Transportation (Non-Medical): No  Physical Activity: Not on file  Stress: Not on file  Social Connections: Not on file  Intimate Partner Violence: Not on file  Depression (PHQ2-9): Low Risk (12/13/2022)   Depression (PHQ2-9)    PHQ-2 Score: 0  Alcohol Screen: Not on file  Housing: Not on file  Utilities: Not on file  Health Literacy: Not on file     Constitutional: Denies fever, malaise, fatigue, headache or abrupt weight changes.  Respiratory: Denies difficulty breathing, shortness of breath, cough or sputum production.   Cardiovascular: Denies chest pain, chest tightness, palpitations or swelling in the hands or feet.  Gastrointestinal: Pt reports rectal pain. Denies abdominal pain, bloating, constipation, diarrhea or blood in the stool.   No other specific complaints in a complete review of systems (except as listed in HPI above).      Objective:   Physical Exam BP 112/74 (BP Location: Right Arm, Patient Position: Sitting, Cuff Size: Normal)   Ht 5' 4 (1.626 m)   Wt 146 lb 3.2 oz (66.3 kg)   BMI 25.10 kg/m    Wt Readings from Last 3 Encounters:  02/18/24 153 lb (69.4 kg)  04/18/23 148 lb (67.1 kg)  12/13/22  139 lb (63 kg)    General: Appears her stated age, overweight, in NAD. Cardiovascular: Normal rate and rhythm.  Pulmonary/Chest: Normal effort. No respiratory distress.  Rectal: Deferred  Musculoskeletal: No difficulty with gait.  Neurological: Alert and oriented.  BMET    Component Value Date/Time   NA 139 04/18/2023 1106   NA 140 02/28/2016 0900   NA 144 07/10/2011 1025   K 4.3 04/18/2023 1106   K 3.6 07/10/2011 1025   CL 105 04/18/2023 1106   CL 108 (H) 07/10/2011 1025   CO2 22 04/18/2023 1106   CO2 24 07/10/2011 1025   GLUCOSE 206 (H) 04/18/2023 1106  GLUCOSE 142 (H) 07/10/2011 1025   BUN 13 04/18/2023 1106   BUN 10 02/28/2016 0900   BUN 10 07/10/2011 1025   CREATININE 0.87 04/18/2023 1106   CALCIUM 9.5 04/18/2023 1106   CALCIUM 8.7 07/10/2011 1025   GFRNONAA >60 12/01/2018 2325   GFRNONAA >60 07/10/2011 1025   GFRAA >60 12/01/2018 2325   GFRAA >60 07/10/2011 1025    Lipid Panel     Component Value Date/Time   CHOL 226 (H) 04/18/2023 1106   CHOL 209 (H) 10/16/2017 1438   TRIG 99 04/18/2023 1106   HDL 56 04/18/2023 1106   HDL 42 10/16/2017 1438   CHOLHDL 4.0 04/18/2023 1106   VLDL 45.4 (H) 01/18/2020 1542   LDLCALC 149 (H) 04/18/2023 1106    CBC    Component Value Date/Time   WBC 9.3 04/28/2023 1059   RBC 5.10 04/28/2023 1059   HGB 14.9 04/28/2023 1059   HGB 16.2 (H) 12/20/2020 1105   HCT 44.7 04/28/2023 1059   HCT 47.4 (H) 12/20/2020 1105   PLT 252 04/28/2023 1059   PLT 209 12/20/2020 1105   MCV 87.6 04/28/2023 1059   MCV 85 12/20/2020 1105   MCV 88 07/10/2011 1025   MCH 29.2 04/28/2023 1059   MCHC 33.3 04/28/2023 1059   RDW 11.9 04/28/2023 1059   RDW 12.3 12/20/2020 1105   RDW 12.6 07/10/2011 1025   LYMPHSABS 2.5 12/20/2020 1105   MONOABS 0.8 12/01/2018 2325   EOSABS 233 04/28/2023 1059   EOSABS 0.3 12/20/2020 1105   BASOSABS 74 04/28/2023 1059   BASOSABS 0.1 12/20/2020 1105    Hgb A1C Lab Results  Component Value Date   HGBA1C 5.8  (H) 04/18/2023            Assessment & Plan:    Assessment and Plan    External hemorrhoid Recurrent, painful, non-bleeding external hemorrhoid with recent exacerbation. Reduction in size suggests no thrombosis. Previous treatments ineffective. - Prescribed proctofoam HC twice daily x 7 days. - Advised increased water  intake. -Encouraged high-fiber diet - Recommended gastroenterology referral if no improvement.        Schedule an appointment for your annual exam Angeline Laura, NP

## 2024-04-08 NOTE — Anesthesia Preprocedure Evaluation (Signed)
 Anesthesia Evaluation    Reviewed: Allergy & Precautions, Patient's Chart, lab work & pertinent test results  History of Anesthesia Complications (+) PONV and history of anesthetic complications  Airway        Dental   Pulmonary asthma           Cardiovascular + dysrhythmias Supra Ventricular Tachycardia      Neuro/Psych  PSYCHIATRIC DISORDERS Anxiety Depression Bipolar Disorder   negative neurological ROS     GI/Hepatic negative GI ROS, Neg liver ROS,,,  Endo/Other  negative endocrine ROS    Renal/GU negative Renal ROS     Musculoskeletal Left shoulder acromioclavicular osteoarthritis, biceps tear, impingement   Abdominal   Peds  Hematology negative hematology ROS (+)   Anesthesia Other Findings   Reproductive/Obstetrics                              Anesthesia Physical Anesthesia Plan  ASA: 2  Anesthesia Plan: General   Post-op Pain Management: Regional block*, Tylenol  PO (pre-op)* and Toradol  IV (intra-op)*   Induction: Intravenous  PONV Risk Score and Plan: 4 or greater and Midazolam , Dexamethasone  and Ondansetron   Airway Management Planned: Oral ETT  Additional Equipment:   Intra-op Plan:   Post-operative Plan: Extubation in OR  Informed Consent:   Plan Discussed with:   Anesthesia Plan Comments:         Anesthesia Quick Evaluation

## 2024-04-09 ENCOUNTER — Other Ambulatory Visit: Payer: Self-pay

## 2024-04-09 ENCOUNTER — Encounter (HOSPITAL_BASED_OUTPATIENT_CLINIC_OR_DEPARTMENT_OTHER): Admission: RE | Disposition: A | Payer: Self-pay | Attending: Orthopedic Surgery

## 2024-04-09 ENCOUNTER — Ambulatory Visit (HOSPITAL_BASED_OUTPATIENT_CLINIC_OR_DEPARTMENT_OTHER): Payer: Self-pay | Admitting: Anesthesiology

## 2024-04-09 ENCOUNTER — Encounter (HOSPITAL_BASED_OUTPATIENT_CLINIC_OR_DEPARTMENT_OTHER): Payer: Self-pay | Admitting: Anesthesiology

## 2024-04-09 ENCOUNTER — Encounter (HOSPITAL_BASED_OUTPATIENT_CLINIC_OR_DEPARTMENT_OTHER): Payer: Self-pay | Admitting: Orthopedic Surgery

## 2024-04-09 ENCOUNTER — Ambulatory Visit (HOSPITAL_BASED_OUTPATIENT_CLINIC_OR_DEPARTMENT_OTHER)
Admission: RE | Admit: 2024-04-09 | Discharge: 2024-04-09 | Disposition: A | Discharge status: Home / Self Care | Attending: Orthopedic Surgery | Admitting: Orthopedic Surgery

## 2024-04-09 DIAGNOSIS — S43432A Superior glenoid labrum lesion of left shoulder, initial encounter: Secondary | ICD-10-CM | POA: Diagnosis not present

## 2024-04-09 DIAGNOSIS — M7542 Impingement syndrome of left shoulder: Secondary | ICD-10-CM

## 2024-04-09 DIAGNOSIS — M19012 Primary osteoarthritis, left shoulder: Secondary | ICD-10-CM | POA: Diagnosis present

## 2024-04-09 DIAGNOSIS — M25812 Other specified joint disorders, left shoulder: Secondary | ICD-10-CM | POA: Diagnosis not present

## 2024-04-09 DIAGNOSIS — Z01818 Encounter for other preprocedural examination: Secondary | ICD-10-CM

## 2024-04-09 DIAGNOSIS — X58XXXA Exposure to other specified factors, initial encounter: Secondary | ICD-10-CM | POA: Diagnosis not present

## 2024-04-09 DIAGNOSIS — J45909 Unspecified asthma, uncomplicated: Secondary | ICD-10-CM | POA: Insufficient documentation

## 2024-04-09 HISTORY — PX: POSTERIOR LUMBAR FUSION 2 WITH HARDWARE REMOVAL: SHX7297

## 2024-04-09 HISTORY — PX: SUBACROMIAL DECOMPRESSION: SHX5174

## 2024-04-09 HISTORY — PX: BICEPT TENODESIS: SHX5116

## 2024-04-09 MED ORDER — SUGAMMADEX SODIUM 200 MG/2ML IV SOLN
INTRAVENOUS | Status: DC | PRN
  Administered 2024-04-09: 260 mg via INTRAVENOUS

## 2024-04-09 MED ORDER — FENTANYL CITRATE (PF) 100 MCG/2ML IJ SOLN
100.0000 ug | Freq: Once | INTRAMUSCULAR | Status: AC
  Administered 2024-04-09: 100 ug via INTRAVENOUS

## 2024-04-09 MED ORDER — MIDAZOLAM HCL (PF) 2 MG/2ML IJ SOLN
2.0000 mg | Freq: Once | INTRAMUSCULAR | Status: AC
  Administered 2024-04-09: 1 mg via INTRAVENOUS

## 2024-04-09 MED ORDER — ACETAMINOPHEN 500 MG PO TABS
1000.0000 mg | ORAL_TABLET | Freq: Once | ORAL | Status: AC
  Administered 2024-04-09: 1000 mg via ORAL

## 2024-04-09 MED ORDER — LIDOCAINE HCL (CARDIAC) PF 100 MG/5ML IV SOSY
PREFILLED_SYRINGE | INTRAVENOUS | Status: DC | PRN
  Administered 2024-04-09: 80 mg via INTRAVENOUS

## 2024-04-09 MED ORDER — DEXAMETHASONE SODIUM PHOSPHATE 4 MG/ML IJ SOLN
INTRAMUSCULAR | Status: DC | PRN
  Administered 2024-04-09: 5 mg via INTRAVENOUS

## 2024-04-09 MED ORDER — MIDAZOLAM HCL 2 MG/2ML IJ SOLN
INTRAMUSCULAR | Status: AC
  Filled 2024-04-09: qty 2

## 2024-04-09 MED ORDER — FENTANYL CITRATE (PF) 100 MCG/2ML IJ SOLN: INTRAMUSCULAR | Status: DC | PRN

## 2024-04-09 MED ORDER — MIDAZOLAM HCL (PF) 2 MG/2ML IJ SOLN
2.0000 mg | Freq: Once | INTRAMUSCULAR | Status: AC
  Administered 2024-04-09: 2 mg via INTRAVENOUS

## 2024-04-09 MED ORDER — FENTANYL CITRATE (PF) 100 MCG/2ML IJ SOLN
INTRAMUSCULAR | Status: AC
  Filled 2024-04-09: qty 2

## 2024-04-09 MED ORDER — CEFAZOLIN SODIUM-DEXTROSE 2-4 GM/100ML-% IV SOLN
2.0000 g | INTRAVENOUS | Status: AC
  Administered 2024-04-09: 2 g via INTRAVENOUS

## 2024-04-09 MED ORDER — FENTANYL CITRATE (PF) 100 MCG/2ML IJ SOLN: 25.0000 ug | INTRAMUSCULAR | Status: DC | PRN

## 2024-04-09 MED ORDER — ONDANSETRON HCL 4 MG/2ML IJ SOLN: 4.0000 mg | Freq: Once | INTRAMUSCULAR | Status: DC | PRN

## 2024-04-09 MED ORDER — PROPOFOL 10 MG/ML IV BOLUS
INTRAVENOUS | Status: AC
  Filled 2024-04-09: qty 20

## 2024-04-09 MED ORDER — ONDANSETRON 4 MG PO TBDP: 4.0000 mg | ORAL_TABLET | Freq: Three times a day (TID) | ORAL | 0 refills | Status: DC | PRN

## 2024-04-09 MED ORDER — ONDANSETRON HCL 4 MG/2ML IJ SOLN
INTRAMUSCULAR | Status: AC
  Filled 2024-04-09: qty 2

## 2024-04-09 MED ORDER — SODIUM CHLORIDE 0.9 % IR SOLN
Status: DC | PRN
  Administered 2024-04-09: 3000 mL

## 2024-04-09 MED ORDER — SCOPOLAMINE 1 MG/3DAYS TD PT72
MEDICATED_PATCH | TRANSDERMAL | Status: AC
  Filled 2024-04-09: qty 1

## 2024-04-09 MED ORDER — SCOPOLAMINE 1 MG/3DAYS TD PT72
1.0000 | MEDICATED_PATCH | TRANSDERMAL | Status: DC
  Administered 2024-04-09: 1 mg via TRANSDERMAL

## 2024-04-09 MED ORDER — ONDANSETRON HCL 4 MG/2ML IJ SOLN
INTRAMUSCULAR | Status: DC | PRN
  Administered 2024-04-09: 4 mg via INTRAVENOUS

## 2024-04-09 MED ORDER — AMISULPRIDE (ANTIEMETIC) 5 MG/2ML IV SOLN: 10.0000 mg | Freq: Once | INTRAVENOUS | Status: DC | PRN

## 2024-04-09 MED ORDER — PROPOFOL 10 MG/ML IV BOLUS
INTRAVENOUS | Status: DC | PRN
  Administered 2024-04-09: 150 mg via INTRAVENOUS

## 2024-04-09 MED ORDER — PHENYLEPHRINE HCL (PRESSORS) 10 MG/ML IV SOLN
INTRAVENOUS | Status: DC | PRN
  Administered 2024-04-09: 80 ug via INTRAVENOUS
  Administered 2024-04-09 (×3): 160 ug via INTRAVENOUS

## 2024-04-09 MED ORDER — CEFAZOLIN SODIUM-DEXTROSE 2-4 GM/100ML-% IV SOLN
INTRAVENOUS | Status: AC
  Filled 2024-04-09: qty 100

## 2024-04-09 MED ORDER — OXYCODONE HCL 5 MG PO TABS: 5.0000 mg | ORAL_TABLET | ORAL | 0 refills | Status: DC | PRN

## 2024-04-09 MED ORDER — ROCURONIUM BROMIDE 100 MG/10ML IV SOLN
INTRAVENOUS | Status: DC | PRN
  Administered 2024-04-09: 50 mg via INTRAVENOUS

## 2024-04-09 MED ORDER — ACETAMINOPHEN 500 MG PO TABS
ORAL_TABLET | ORAL | Status: AC
  Filled 2024-04-09: qty 2

## 2024-04-09 MED ORDER — LIDOCAINE 2% (20 MG/ML) 5 ML SYRINGE
INTRAMUSCULAR | Status: AC
  Filled 2024-04-09: qty 5

## 2024-04-09 MED ORDER — BUPIVACAINE HCL (PF) 0.5 % IJ SOLN
INTRAMUSCULAR | Status: DC | PRN
  Administered 2024-04-09: 10 mL via PERINEURAL

## 2024-04-09 MED ORDER — ONDANSETRON 4 MG PO TBDP: 4.0000 mg | ORAL_TABLET | Freq: Three times a day (TID) | ORAL | 0 refills | Status: AC | PRN

## 2024-04-09 MED ORDER — ROCURONIUM BROMIDE 10 MG/ML (PF) SYRINGE
PREFILLED_SYRINGE | INTRAVENOUS | Status: AC
  Filled 2024-04-09: qty 10

## 2024-04-09 MED ORDER — LACTATED RINGERS IV SOLN: INTRAVENOUS | Status: DC

## 2024-04-09 MED ORDER — BUPIVACAINE LIPOSOME 1.3 % IJ SUSP
INTRAMUSCULAR | Status: DC | PRN
  Administered 2024-04-09: 10 mL via PERINEURAL

## 2024-04-09 MED ORDER — OXYCODONE HCL 5 MG PO TABS: 5.0000 mg | ORAL_TABLET | ORAL | 0 refills | Status: AC | PRN

## 2024-04-09 NOTE — Anesthesia Procedure Notes (Signed)
 Procedure Name: Intubation Date/Time: 04/09/2024 7:56 AM  Performed by: Burnard Rosaline HERO, CRNAPre-anesthesia Checklist: Patient identified, Emergency Drugs available, Suction available and Patient being monitored Patient Re-evaluated:Patient Re-evaluated prior to induction Oxygen Delivery Method: Circle system utilized Preoxygenation: Pre-oxygenation with 100% oxygen Induction Type: IV induction Ventilation: Mask ventilation without difficulty Laryngoscope Size: Mac and 4 Grade View: Grade I Tube type: Oral Tube size: 7.0 mm Number of attempts: 1 Airway Equipment and Method: Stylet and Oral airway Placement Confirmation: ETT inserted through vocal cords under direct vision, positive ETCO2, breath sounds checked- equal and bilateral and CO2 detector Secured at: 23 cm Tube secured with: Tape Dental Injury: Teeth and Oropharynx as per pre-operative assessment

## 2024-04-09 NOTE — Anesthesia Postprocedure Evaluation (Signed)
"   Anesthesia Post Note  Patient: OSHA RANE  Procedure(s) Performed: ARTHROSCOPY, SHOULDER WITH DEBRIDEMENT (Left: Shoulder) DECOMPRESSION, SUBACROMIAL SPACE (Left: Shoulder) TENODESIS, BICEPS (Left: Shoulder)     Patient location during evaluation: Phase II Anesthesia Type: General Level of consciousness: awake and alert Pain management: pain level controlled Vital Signs Assessment: post-procedure vital signs reviewed and stable Respiratory status: spontaneous breathing, nonlabored ventilation and respiratory function stable Cardiovascular status: blood pressure returned to baseline and stable Postop Assessment: no apparent nausea or vomiting Anesthetic complications: no   No notable events documented.  Last Vitals:  Vitals:   04/09/24 0945 04/09/24 1009  BP: (!) 102/56 123/75  Pulse: (!) 50 68  Resp: 15 16  Temp:  (!) 36.2 C  SpO2: 94% 97%    Last Pain:  Vitals:   04/09/24 1009  TempSrc:   PainSc: 0-No pain                 Garnette FORBES Skillern      "

## 2024-04-09 NOTE — Op Note (Addendum)
 Date of Surgery: 04/09/2024  INDICATIONS: Tasha Miller is a 53 y.o.-year-old female with a left shoulder biceps tendinopathy with symptomatic SLAP tear as well as symptomatic AC joint arthritis.  Here today for arthroscopic intervention.;  The patient did consent to the procedure after discussion of the risks and benefits.  PREOPERATIVE DIAGNOSIS: 1.  Left shoulder proximal biceps tendinitis 2.  Left shoulder type II SLAP tear 3.  Left shoulder subacromial impingement 4.  Left shoulder acromioclavicular joint arthropathy  POSTOPERATIVE DIAGNOSIS: Same.  PROCEDURE:  1.  Left shoulder arthroscopic extensive debridement of anterior labrum, superior labrum, rotator interval, subacromial bursa 2.  Left shoulder arthroscopic biceps tenodesis 3.  Left shoulder arthroscopic subacromial decompression 4.  Left shoulder distal clavicle resection, 1 cm resected distal clavicle.  SURGEON: Selinda SHAUNNA Gosling, M.D.  ASSIST: Dayle Moores, PA-C  Assistant attestation:  PA Mcclung scrubbed and present for the entire procedure.  ANESTHESIA:  general, interscalene with Exparel  IV FLUIDS AND URINE: See anesthesia.  ESTIMATED BLOOD LOSS: 15 mL.  IMPLANTS: Arthrex 3.9 mm swivel lock anchor for biceps tenodesis  DRAINS: None  COMPLICATIONS: None.  DESCRIPTION OF PROCEDURE: The patient was brought to the operating room and placed supine on the operating table.  The patient had been signed prior to the procedure and this was documented. The patient had the anesthesia placed by the anesthesiologist.  A time-out was performed to confirm that this was the correct patient, site, side and location. The patient did receive antibiotics prior to the incision and was re-dosed during the procedure as needed at indicated intervals.  A tourniquet was not placed.  The patient had the operative extremity prepped and draped in the standard surgical fashion.      After obtaining informed consent the patient was brought to  the operating table and underwent satisfactory anesthesia. An exam under anesthesia revealed full range of motion.  She was placed in the right lateral decubitus position with an axillary roll and all bony prominences properly padded. A standard surgical timeout was performed.  She was placed in 10 pounds of gentle in-line suspension.  Standard posterior and anterior superior portals were established. A diagnostic evaluation of the glenohumeral joint was performed. The biceps tendon was markedly synovitic and partially torn.  She did have a Buford variant anterior labrum, with degenerative tearing of the anterior labrum as well as posterior labrum.  Type II SLAP tear noted.  Intact rotator cuff muscles x 4 with no loose bodies.  At this point in time, we introduced an anterior working portal.  Via this portal we began our extensive debridement with the motorized shaver.  We debrided rotator interval tissue as well as release the middle glenohumeral ligament to assist with postoperative motion.  The anterior, superior and posterior labrum was gently debrided as well of any unstable tissue.    Next, we went ahead with the arthroscopic assisted biceps tenodesis high in the groove with a loop and tack technique.  A link suture was passed around the proximal biceps, this was then loaded into an antegrade suture passer and the tendon was then pierced just distal to this with a locking Krakw style stitch.  This was loaded into a 3.9 mm Arthrex double lock anchor.  A awl was then used to create a pilot hole adjacent to the superior border of the subscapularis tendon.  The anchor was introduced there and secured the biceps tendon.  The biceps was then released from the superior labrum.  The arthroscope was  inserted in the subacromial space and an additional lateral portal was established. An acromioplasty performed nicely decompressing the subacromial space with a motorized burr. Bursitis in the subacromial space  was removed as well as releases were performed on the bursal surface of the rotator cuff.   Lastly, after completing the acromioplasty with cutting block technique, moved to the distal clavicle resection.  While viewing from the lateral portal and working from the anterior mid glenoid portal introduced into the subacromial/subdeltoid space, we were able to establish a good position for the distal clavicle resection.  We utilized a motorized bur to resect about 1 cm of distal clavicle.  Care was taken to preserve the superior and posterior capsular structures.  The arthroscope was then removed and portals closed with 3-0 Monocryl in standard fashion followed by a sterile occlusive dressing Polar Care ice sleeve and a slingshot sling. The patient was sent to recovery in stable condition and tolerated the procedure well  POSTOPERATIVE PLAN:  [name] will be in a sling for 2 weeks postoperatively.  She will discharge home today from PACU.  Follow-up with me in the office in 2 weeks.

## 2024-04-09 NOTE — H&P (Signed)
 "  ORTHOPAEDIC H and P  REQUESTING PHYSICIAN: Sharl Tasha Dover, MD  PCP:  Antonette Angeline ORN, NP  Chief Complaint: Left shoulder pain  HPI: Tasha Miller is a 53 y.o. female who complains of  left shoulder pain and weakness.  Here today for arthroscopy.  No new complaints.  Past Medical History:  Diagnosis Date   Anxiety    Asthma    seasonal   Depression    History of kidney stones    PONV (postoperative nausea and vomiting)    Rubella    SVT (supraventricular tachycardia) 2016   resolved   Past Surgical History:  Procedure Laterality Date   CONDYLOMA EXCISION/FULGURATION N/A 12/18/2021   Procedure: LASER VAPORIZATION OF VULVAR/VAGINA;  Surgeon: Mat Browning, MD;  Location: [facility];  Service: Gynecology;  Laterality: N/A;   DIAGNOSTIC LAPAROSCOPY     left wrist surgery     lymph node removal on right side of neck     right elbow surgery     rotator cuff surgery     TONSILLECTOMY     VAGINAL HYSTERECTOMY     VULVAR LESION REMOVAL N/A 05/24/2015   Procedure: resection of VULVAR condyloma with colposcope;  Surgeon: Browning Mat, MD;  Location: [facility];  Service: Gynecology;  Laterality: N/A;  need colposcope   WISDOM TOOTH EXTRACTION     Social History   Socioeconomic History   Marital status: Media Planner    Spouse name: Not on file   Number of children: Not on file   Years of education: Not on file   Highest education level: Not on file  Occupational History   Not on file  Tobacco Use   Smoking status: Never   Smokeless tobacco: Never  Vaping Use   Vaping status: Never Used  Substance and Sexual Activity   Alcohol use: No   Drug use: No   Sexual activity: Yes    Birth control/protection: Surgical    Comment: hyst  Other Topics Concern   Not on file  Social History Narrative   Not on file   Social Drivers of Health   Tobacco Use: Low Risk (04/09/2024)   Patient History    Smoking Tobacco Use: Never    Smokeless Tobacco  Use: Never    Passive Exposure: Not on file  Financial Resource Strain: Low Risk  (12/11/2022)   Received from [facility]   Overall Financial Resource Strain (CARDIA)    Difficulty of Paying Living Expenses: Not very hard  Food Insecurity: No Food Insecurity (12/11/2022)   Received from [facility]   Epic    Within the past 12 months, you worried that your food would run out before you got the money to buy more.: Never true    Within the past 12 months, the food you bought just didn't last and you didn't have money to get more.: Never true  Transportation Needs: No Transportation Needs (12/11/2022)   Received from [facility] - Transportation    In the past 12 months, has lack of transportation kept you from medical appointments or from getting medications?: No    Lack of Transportation (Non-Medical): No  Physical Activity: Not on file  Stress: Not on file  Social Connections: Not on file  Depression (EYV7-0): Low Risk (12/13/2022)   Depression (PHQ2-9)    PHQ-2 Score: 0  Alcohol Screen: Not on file  Housing: Not on file  Utilities: Not on  file  Health Literacy: Not on file   Family History  Problem Relation Age of Onset   Cancer Maternal Aunt        thyroid  cancer   Cancer Maternal Uncle    Diabetes Paternal Aunt    Heart disease Neg Hx    Stroke Neg Hx    Breast cancer Neg Hx    Allergies[1] Prior to Admission medications  Medication Sig Start Date End Date Taking? Authorizing Provider  cyanocobalamin (,VITAMIN B-12,) 1000 MCG/ML injection Inject 1,000 mcg into the muscle every 14 (fourteen) days.   Yes [provider]  FLUoxetine (PROZAC) 20 MG capsule Take 20 mg by mouth daily. 01/28/23  Yes [provider]  progesterone (PROMETRIUM) 100 MG capsule Take 100 mg by mouth at bedtime. 08/27/18  Yes [provider]  Testosterone 200 MG PLLT by Implant route.   Yes [provider]   traMADol  (ULTRAM ) 50 MG tablet Take 1 tablet (50 mg total) by mouth daily as needed. 12/13/22  Yes Baity, Angeline ORN, NP  Vitamin D , Ergocalciferol , (DRISDOL ) 1.25 MG (50000 UT) CAPS capsule TAKE 1 CAPSULE BY MOUTH WEEKLY 08/13/18  Yes [provider]  albuterol  (VENTOLIN  HFA) 108 (90 Base) MCG/ACT inhaler Inhale 2 puffs into the lungs as needed for wheezing or shortness of breath. 04/24/20   Antonette Angeline ORN, NP  hydrocortisone -pramoxine (PROCTOFOAM-HC) rectal foam Place 1 applicator rectally 2 (two) times daily. 04/02/24   Antonette Angeline ORN, NP   No results found.  Positive ROS: All other systems have been reviewed and were otherwise negative with the exception of those mentioned in the HPI and as above.  Physical Exam: General: Alert, no acute distress Cardiovascular: No pedal edema Respiratory: No cyanosis, no use of accessory musculature GI: No organomegaly, abdomen is soft and non-tender Skin: No lesions in the area of chief complaint Neurologic: Sensation intact distally Psychiatric: Patient is competent for consent with normal mood and affect Lymphatic: No axillary or cervical lymphadenopathy  MUSCULOSKELETAL: LUE-wwp, NVI  Assessment: Left shoulder impingement Left shoulder biceps tendinitis Left shoulder acromioclavicular arthritis  Plan: - to OR today for arthroscopic debridement, biceps tenodesis, and distal clavicle resection.  No new complaints or questions today.  - informed consent obtained  - dc home post op    Tasha Belvie Gosling, MD Cell [phone]    04/09/2024 6:36 AM     [1]  Allergies Allergen Reactions   Latex Itching and Swelling   Sulfa Antibiotics Itching and Nausea And Vomiting   Amoxicillin -Pot Clavulanate Nausea And Vomiting   Sulfonamide Derivatives Nausea And Vomiting   "

## 2024-04-09 NOTE — Transfer of Care (Signed)
 Immediate Anesthesia Transfer of Care Note  Patient: Tasha Miller  Procedure(s) Performed: ARTHROSCOPY, SHOULDER WITH DEBRIDEMENT (Left: Shoulder) DECOMPRESSION, SUBACROMIAL SPACE (Left: Shoulder) TENODESIS, BICEPS (Left: Shoulder)  Patient Location: PACU  Anesthesia Type:General  Level of Consciousness: awake, alert , and oriented  Airway & Oxygen Therapy: Patient Spontanous Breathing and Patient connected to face mask oxygen  Post-op Assessment: Report given to RN and Post -op Vital signs reviewed and stable  Post vital signs: Reviewed and stable  Last Vitals:  Vitals Value Taken Time  BP 137/71 04/09/24 08:48  Temp    Pulse 101 04/09/24 08:51  Resp 13 04/09/24 08:51  SpO2 95 % 04/09/24 08:51  Vitals shown include unfiled device data.  Last Pain:  Vitals:   04/09/24 0629  TempSrc: Temporal  PainSc: 0-No pain         Complications: No notable events documented.

## 2024-04-09 NOTE — Anesthesia Procedure Notes (Signed)
 Anesthesia Regional Block: Interscalene brachial plexus block   Pre-Anesthetic Checklist: , timeout performed,  Correct Patient, Correct Site, Correct Laterality,  Correct Procedure, Correct Position, site marked,  Risks and benefits discussed,  Surgical consent,  Pre-op evaluation,  At surgeon's request and post-op pain management  Laterality: Left  Prep: chloraprep       Needles:  Injection technique: Single-shot  Needle Type: Echogenic Stimulator Needle     Needle Length: 9cm  Needle Gauge: 22     Additional Needles:   Procedures:,,,, ultrasound used (permanent image in chart),,    Narrative:  Start time: 04/09/2024 6:55 AM End time: 04/09/2024 7:02 AM Injection made incrementally with aspirations every 5 mL.  Performed by: Personally  Anesthesiologist: Corinne Garnette BRAVO, MD  Additional Notes: Functioning IV was confirmed and monitors were applied.  A 90mm 22ga echogenic stimulator needle was used. Sterile prep and drape, hand hygiene, and sterile gloves were used.  Negative aspiration and negative test dose prior to incremental administration of local anesthetic. The patient tolerated the procedure well.  Ultrasound guidance: relevent anatomy identified, needle position confirmed, local anesthetic spread visualized around nerve(s), vascular puncture avoided.  Image printed for medical record.

## 2024-04-09 NOTE — Brief Op Note (Signed)
 04/09/2024  8:41 AM  PATIENT:  Tasha Miller  53 y.o. female  PRE-OPERATIVE DIAGNOSIS:  Left shoulder acromioclavicular osteoarthritis, biceps tear, impingement  POST-OPERATIVE DIAGNOSIS:  Left shoulder acromioclavicular osteoarthritis, biceps tear, impingement  PROCEDURE:  Procedures with comments: ARTHROSCOPY, SHOULDER WITH DEBRIDEMENT (Left) DECOMPRESSION, SUBACROMIAL SPACE (Left) - Left shoulder arthroscopy with subacromial decompression, distal clavicle resection, biceps tenodesis TENODESIS, BICEPS (Left)  SURGEON:  Surgeons and Role:    * Sharl Selinda Dover, MD - Primary  PHYSICIAN ASSISTANT: Dayle Moores, PA-C   ANESTHESIA:   regional and general  EBL:  15 cc  BLOOD ADMINISTERED:none  DRAINS: none   LOCAL MEDICATIONS USED:  NONE  SPECIMEN:  No Specimen  DISPOSITION OF SPECIMEN:  N/A  COUNTS:  YES  TOURNIQUET:  * No tourniquets in log *  DICTATION: .Note written in EPIC  PLAN OF CARE: Discharge to home after PACU  PATIENT DISPOSITION:  PACU - hemodynamically stable.   Delay start of Pharmacological VTE agent (>24hrs) due to surgical blood loss or risk of bleeding: not applicable

## 2024-04-09 NOTE — Discharge Instructions (Addendum)
 Orthopedic surgery discharge instructions:  -Maintain postoperative bandages for 3 days.  You may remove these bandages on post op day 3 and begin showering at that time.  Please do not submerge underwater.  -Maintain your arm in sling at all times.  You should only remove for showering and getting dressed.  No lifting with the operative arm.  -For mild to moderate pain use Tylenol  and Advil  in alternating fashion around-the-clock.  For breakthrough pain use oxycodone  as necessary.  -Please apply ice to the right shoulder for 20-30 minutes out of each hour that you are awake.  Do this around-the-clock for the first 3 days from surgery.  -Follow-up in 2 weeks for routine postoperative check.  NO TYLENOL  UNTIL AFTER 10:30 AM  Post Anesthesia Home Care Instructions  Activity: Get plenty of rest for the remainder of the day. A responsible individual must stay with you for 24 hours following the procedure.  For the next 24 hours, DO NOT: -Drive a car -Advertising copywriter -Drink alcoholic beverages -Take any medication unless instructed by your physician -Make any legal decisions or sign important papers.  Meals: Start with liquid foods such as gelatin or soup. Progress to regular foods as tolerated. Avoid greasy, spicy, heavy foods. If nausea and/or vomiting occur, drink only clear liquids until the nausea and/or vomiting subsides. Call your physician if vomiting continues.  Special Instructions/Symptoms: Your throat may feel dry or sore from the anesthesia or the breathing tube placed in your throat during surgery. If this causes discomfort, gargle with warm salt water . The discomfort should disappear within 24 hours.  You had a scopolamine  patch placed behind your RIGHT ear for the management of post- operative nausea and/or vomiting:  1. The medication in the patch is effective for 72 hours, after which it should be removed.  Wrap patch in a tissue and discard in the trash. Wash hands  thoroughly with soap and water . 2. You may remove the patch earlier than 72 hours if you experience unpleasant side effects which may include dry mouth, dizziness or visual disturbances.  Regional Anesthesia Blocks  1. You may not be able to move or feel the blocked extremity after a regional anesthetic block. This may last may last from 3-48 hours after placement, but it will go away. The length of time depends on the medication injected and your individual response to the medication. As the nerves start to wake up, you may experience tingling as the movement and feeling returns to your extremity. If the numbness and inability to move your extremity has not gone away after 48 hours, please call your surgeon.   2. The extremity that is blocked will need to be protected until the numbness is gone and the strength has returned. Because you cannot feel it, you will need to take extra care to avoid injury. Because it may be weak, you may have difficulty moving it or using it. You may not know what position it is in without looking at it while the block is in effect.  3. For blocks in the legs and feet, returning to weight bearing and walking needs to be done carefully. You will need to wait until the numbness is entirely gone and the strength has returned. You should be able to move your leg and foot normally before you try and bear weight or walk. You will need someone to be with you when you first try to ensure you do not fall and possibly risk injury.  4. Bruising  and tenderness at the needle site are common side effects and will resolve in a few days.  5. Persistent numbness or new problems with movement should be communicated to the surgeon or the [facility] [phone] [facility] [phone]).Regional Anesthesia Blocks  1. You may not be able to move or feel the blocked extremity after a regional anesthetic block. This may last may last from 3-48 hours after  placement, but it will go away. The length of time depends on the medication injected and your individual response to the medication. As the nerves start to wake up, you may experience tingling as the movement and feeling returns to your extremity. If the numbness and inability to move your extremity has not gone away after 48 hours, please call your surgeon.   2. The extremity that is blocked will need to be protected until the numbness is gone and the strength has returned. Because you cannot feel it, you will need to take extra care to avoid injury. Because it may be weak, you may have difficulty moving it or using it. You may not know what position it is in without looking at it while the block is in effect.  3. For blocks in the legs and feet, returning to weight bearing and walking needs to be done carefully. You will need to wait until the numbness is entirely gone and the strength has returned. You should be able to move your leg and foot normally before you try and bear weight or walk. You will need someone to be with you when you first try to ensure you do not fall and possibly risk injury.  4. Bruising and tenderness at the needle site are common side effects and will resolve in a few days.  5. Persistent numbness or new problems with movement should be communicated to the surgeon or the [facility] [phone] [facility] [phone]).Information for Discharge Teaching:  EXPAREL (bupivacaine liposome injectable suspension)   Pain relief is important to your recovery. The goal is to control your pain so you can move easier and return to your normal activities as soon as possible after your procedure. Your physician may use several types of medicines to manage pain, swelling, and more.  Your surgeon or anesthesiologist gave you EXPAREL(bupivacaine) to help control your pain after surgery.  EXPAREL is a local anesthetic designed to release slowly over an  extended period of time to provide pain relief by numbing the tissue around the surgical site. EXPAREL is designed to release pain medication over time and can control pain for up to 72 hours. Depending on how you respond to EXPAREL, you may require less pain medication during your recovery. EXPAREL can help reduce or eliminate the need for opioids during the first few days after surgery when pain relief is needed the most. EXPAREL is not an opioid and is not addictive. It does not cause sleepiness or sedation.   Important! A teal colored band has been placed on your arm with the date, time and amount of EXPAREL you have received. Please leave this armband in place for the full 96 hours following administration, and then you may remove the band. If you return to the hospital for any reason within 96 hours following the administration of EXPAREL, the armband provides important information that your health care providers to know, and alerts them that you have received this anesthetic.    Possible side effects of EXPAREL: Temporary loss of  sensation or ability to move in the area where medication was injected. Nausea, vomiting, constipation Rarely, numbness and tingling in your mouth or lips, lightheadedness, or anxiety may occur. Call your doctor right away if you think you may be experiencing any of these sensations, or if you have other questions regarding possible side effects.  Follow all other discharge instructions given to you by your surgeon or nurse. Eat a healthy diet and drink plenty of water  or other fluids.

## 2024-04-09 NOTE — Progress Notes (Signed)
Assisted Dr. Turk with left, interscalene , ultrasound guided block. Side rails up, monitors on throughout procedure. See vital signs in flow sheet. Tolerated Procedure well. 

## 2024-04-12 ENCOUNTER — Encounter (HOSPITAL_BASED_OUTPATIENT_CLINIC_OR_DEPARTMENT_OTHER): Payer: Self-pay | Admitting: Orthopedic Surgery

## 2024-04-20 NOTE — Progress Notes (Deleted)
 "  Subjective:    Patient ID: Tasha Miller, female    DOB: 09-20-1970, 53 y.o.   MRN: [id number]  HPI  Patient presents to clinic today for annual exam.  Flu: 01/2023 Tetanus: 06/2018 COVID: x 1 Shingrix: never Pap smear: 05/2022 Mammogram: 05/2021 Colon screening: 02/2023 Vision screening: annually Dentist: biannually  Diet: She does eat meat. She consumes fruits and veggies. She does eat fried foods. She drinks mostly dt. Mt dew. Exercise: None  Review of Systems   Past Medical History:  Diagnosis Date   Anxiety    Asthma    seasonal   Depression    History of kidney stones    PONV (postoperative nausea and vomiting)    Rubella    SVT (supraventricular tachycardia) 2016   resolved    Current Outpatient Medications  Medication Sig Dispense Refill   albuterol  (VENTOLIN  HFA) 108 (90 Base) MCG/ACT inhaler Inhale 2 puffs into the lungs as needed for wheezing or shortness of breath. 1 each 2   cyanocobalamin (,VITAMIN B-12,) 1000 MCG/ML injection Inject 1,000 mcg into the muscle every 14 (fourteen) days.     FLUoxetine (PROZAC) 20 MG capsule Take 20 mg by mouth daily.     hydrocortisone -pramoxine (PROCTOFOAM-HC) rectal foam Place 1 applicator rectally 2 (two) times daily. 10 g 0   ondansetron  (ZOFRAN -ODT) 4 MG disintegrating tablet Take 1 tablet (4 mg total) by mouth every 8 (eight) hours as needed for nausea or vomiting. 20 tablet 0   oxyCODONE  (ROXICODONE ) 5 MG immediate release tablet Take 1 tablet (5 mg total) by mouth every 4 (four) hours as needed for moderate pain (pain score 4-6). 20 tablet 0   progesterone (PROMETRIUM) 100 MG capsule Take 100 mg by mouth at bedtime.     Testosterone 200 MG PLLT by Implant route.     Vitamin D , Ergocalciferol , (DRISDOL ) 1.25 MG (50000 UT) CAPS capsule TAKE 1 CAPSULE BY MOUTH WEEKLY     No current facility-administered medications for this visit.    Allergies  Allergen Reactions   Latex Itching and Swelling   Sulfa Antibiotics  Itching and Nausea And Vomiting   Amoxicillin -Pot Clavulanate Nausea And Vomiting   Sulfonamide Derivatives Nausea And Vomiting    Family History  Problem Relation Age of Onset   Cancer Maternal Aunt        thyroid  cancer   Cancer Maternal Uncle    Diabetes Paternal Aunt    Heart disease Neg Hx    Stroke Neg Hx    Breast cancer Neg Hx     Social History   Socioeconomic History   Marital status: Media Planner    Spouse name: Not on file   Number of children: Not on file   Years of education: Not on file   Highest education level: Not on file  Occupational History   Not on file  Tobacco Use   Smoking status: Never   Smokeless tobacco: Never  Vaping Use   Vaping status: Never Used  Substance and Sexual Activity   Alcohol use: No   Drug use: No   Sexual activity: Yes    Birth control/protection: Surgical    Comment: hyst  Other Topics Concern   Not on file  Social History Narrative   Not on file   Social Drivers of Health   Tobacco Use: Low Risk (04/09/2024)   Patient History    Smoking Tobacco Use: Never    Smokeless Tobacco Use: Never    Passive Exposure: Not  on file  Financial Resource Strain: Low Risk  (12/11/2022)   Received from [facility]   Overall Financial Resource Strain (CARDIA)    Difficulty of Paying Living Expenses: Not very hard  Food Insecurity: No Food Insecurity (12/11/2022)   Received from [facility]   Epic    Within the past 12 months, you worried that your food would run out before you got the money to buy more.: Never true    Within the past 12 months, the food you bought just didn't last and you didn't have money to get more.: Never true  Transportation Needs: No Transportation Needs (12/11/2022)   Received from [facility] - Transportation    In the past 12 months, has lack of transportation kept you from medical appointments or from getting medications?: No    Lack of  Transportation (Non-Medical): No  Physical Activity: Not on file  Stress: Not on file  Social Connections: Not on file  Intimate Partner Violence: Not on file  Depression (PHQ2-9): Low Risk (12/13/2022)   Depression (PHQ2-9)    PHQ-2 Score: 0  Alcohol Screen: Not on file  Housing: Not on file  Utilities: Not on file  Health Literacy: Not on file     Constitutional: Denies fever, malaise, fatigue, headache or abrupt weight changes.  HEENT: Denies eye pain, eye redness, ear pain, ringing in the ears, wax buildup, runny nose, nasal congestion, bloody nose, or sore throat. Respiratory: Denies difficulty breathing, shortness of breath, cough or sputum production.   Cardiovascular: Denies chest pain, chest tightness, palpitations or swelling in the hands or feet.  Gastrointestinal: Denies abdominal pain, bloating, constipation, diarrhea or blood in the stool.  GU: Denies urgency, frequency, pain with urination, burning sensation, blood in urine, odor or discharge. Musculoskeletal: Patient reports chronic joint pain.  Denies decrease in range of motion, difficulty with gait, muscle pain or joint swelling.  Skin: Denies redness, rashes, lesions or ulcercations.  Neurological: Denies dizziness, difficulty with memory, difficulty with speech or problems with balance and coordination.  Psych: Patient has a history of depression.  Denies anxiety, depression, SI/HI.  No other specific complaints in a complete review of systems (except as listed in HPI above).      Objective:   Physical Exam  There were no vitals taken for this visit.  Wt Readings from Last 3 Encounters:  04/09/24 145 lb 11.6 oz (66.1 kg)  04/02/24 146 lb 3.2 oz (66.3 kg)  02/18/24 153 lb (69.4 kg)    General: Appears her stated age, well developed, well nourished in NAD. Skin: Warm, dry and intact.  HEENT: Head: normal shape and size; Eyes: sclera white, no icterus, conjunctiva pink, PERRLA and EOMs intact;  Neck:  Neck  supple, trachea midline.  Thyromegaly with right thyroid  nodule noted. Cardiovascular: Normal rate and rhythm. S1,S2 noted.  No murmur, rubs or gallops noted. No JVD or BLE edema. No carotid bruits noted. Pulmonary/Chest: Normal effort and positive vesicular breath sounds. No respiratory distress. No wheezes, rales or ronchi noted.  Abdomen: Soft and nontender. Normal bowel sounds.  Musculoskeletal: Strength 5/5 BUE/BLE.  No difficulty with gait.  Neurological: Alert and oriented. Cranial nerves II-XII grossly intact. Coordination normal.  Psychiatric: Mood and affect normal. Behavior is normal. Judgment and thought content normal.    BMET    Component Value Date/Time   NA 139 04/18/2023 1106   NA 140 02/28/2016 0900   NA 144 07/10/2011 1025  K 4.3 04/18/2023 1106   K 3.6 07/10/2011 1025   CL 105 04/18/2023 1106   CL 108 (H) 07/10/2011 1025   CO2 22 04/18/2023 1106   CO2 24 07/10/2011 1025   GLUCOSE 206 (H) 04/18/2023 1106   GLUCOSE 142 (H) 07/10/2011 1025   BUN 13 04/18/2023 1106   BUN 10 02/28/2016 0900   BUN 10 07/10/2011 1025   CREATININE 0.87 04/18/2023 1106   CALCIUM 9.5 04/18/2023 1106   CALCIUM 8.7 07/10/2011 1025   GFRNONAA >60 12/01/2018 2325   GFRNONAA >60 07/10/2011 1025   GFRAA >60 12/01/2018 2325   GFRAA >60 07/10/2011 1025    Lipid Panel     Component Value Date/Time   CHOL 226 (H) 04/18/2023 1106   CHOL 209 (H) 10/16/2017 1438   TRIG 99 04/18/2023 1106   HDL 56 04/18/2023 1106   HDL 42 10/16/2017 1438   CHOLHDL 4.0 04/18/2023 1106   VLDL 45.4 (H) 01/18/2020 1542   LDLCALC 149 (H) 04/18/2023 1106    CBC    Component Value Date/Time   WBC 9.3 04/28/2023 1059   RBC 5.10 04/28/2023 1059   HGB 14.9 04/28/2023 1059   HGB 16.2 (H) 12/20/2020 1105   HCT 44.7 04/28/2023 1059   HCT 47.4 (H) 12/20/2020 1105   PLT 252 04/28/2023 1059   PLT 209 12/20/2020 1105   MCV 87.6 04/28/2023 1059   MCV 85 12/20/2020 1105   MCV 88 07/10/2011 1025   MCH 29.2  04/28/2023 1059   MCHC 33.3 04/28/2023 1059   RDW 11.9 04/28/2023 1059   RDW 12.3 12/20/2020 1105   RDW 12.6 07/10/2011 1025   LYMPHSABS 2.5 12/20/2020 1105   MONOABS 0.8 12/01/2018 2325   EOSABS 233 04/28/2023 1059   EOSABS 0.3 12/20/2020 1105   BASOSABS 74 04/28/2023 1059   BASOSABS 0.1 12/20/2020 1105    Hgb A1C Lab Results  Component Value Date   HGBA1C 5.8 (H) 04/18/2023            Assessment & Plan:    Preventative health maintenance:  Flu shot  Tetanus UTD Encouraged her to get her COVID-vaccine Discussed Shingrix vaccine, she will check coverage with her insurance company and schedule a visit if she would like to have this done Pap smear UTD Mammogram ordered-she will call to schedule Colon screening UTD Encouraged her to consume a balanced diet and exercise regimen Advised her to see an eye doctor and dentist annually We will check CBC, c-Met, lipid, A1c today   RTC in 6 months, follow-up chronic conditions Angeline Laura, NP  "

## 2024-04-21 ENCOUNTER — Encounter: Admitting: Internal Medicine

## 2024-04-27 ENCOUNTER — Encounter (HOSPITAL_BASED_OUTPATIENT_CLINIC_OR_DEPARTMENT_OTHER): Payer: Self-pay | Admitting: Orthopedic Surgery

## 2024-05-24 ENCOUNTER — Encounter: Admitting: Internal Medicine

## 2024-05-26 ENCOUNTER — Encounter: Admitting: Internal Medicine
